# Patient Record
Sex: Male | Born: 1955 | Hispanic: No | Marital: Married | Smoking: Never smoker
Health system: Southern US, Community
[De-identification: ages and names within clinical notes are randomized; demographics above are authoritative.]

## PROBLEM LIST (undated history)

## (undated) DIAGNOSIS — K219 Gastro-esophageal reflux disease without esophagitis: Secondary | ICD-10-CM

## (undated) DIAGNOSIS — I2510 Atherosclerotic heart disease of native coronary artery without angina pectoris: Secondary | ICD-10-CM

## (undated) DIAGNOSIS — E785 Hyperlipidemia, unspecified: Secondary | ICD-10-CM

## (undated) DIAGNOSIS — M549 Dorsalgia, unspecified: Secondary | ICD-10-CM

## (undated) DIAGNOSIS — I219 Acute myocardial infarction, unspecified: Secondary | ICD-10-CM

## (undated) HISTORY — DX: Gastro-esophageal reflux disease without esophagitis: K21.9

## (undated) HISTORY — DX: Hyperlipidemia, unspecified: E78.5

## (undated) HISTORY — PX: TONSILLECTOMY: SUR1361

## (undated) HISTORY — DX: Atherosclerotic heart disease of native coronary artery without angina pectoris: I25.10

## (undated) HISTORY — DX: Dorsalgia, unspecified: M54.9

---
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
## 2000-04-01 ENCOUNTER — Ambulatory Visit: Admission: RE | Admit: 2000-04-01 | Discharge: 2000-04-01 | Payer: Self-pay | Admitting: Orthopedic Surgery

## 2000-04-01 ENCOUNTER — Encounter: Payer: Self-pay | Admitting: Orthopedic Surgery

## 2002-07-13 ENCOUNTER — Ambulatory Visit (HOSPITAL_COMMUNITY): Admission: RE | Admit: 2002-07-13 | Discharge: 2002-07-13 | Payer: Self-pay | Admitting: *Deleted

## 2007-07-21 ENCOUNTER — Ambulatory Visit: Payer: Self-pay | Admitting: Cardiology

## 2007-07-21 ENCOUNTER — Inpatient Hospital Stay (HOSPITAL_COMMUNITY): Admission: AD | Admit: 2007-07-21 | Discharge: 2007-07-23 | Payer: Self-pay | Admitting: Cardiology

## 2007-07-27 ENCOUNTER — Ambulatory Visit: Payer: Self-pay | Admitting: Cardiology

## 2007-07-27 ENCOUNTER — Ambulatory Visit: Payer: Self-pay

## 2007-08-10 ENCOUNTER — Ambulatory Visit: Payer: Self-pay | Admitting: Internal Medicine

## 2007-08-11 ENCOUNTER — Encounter (HOSPITAL_COMMUNITY): Admission: RE | Admit: 2007-08-11 | Discharge: 2007-11-16 | Payer: Self-pay | Admitting: Cardiology

## 2007-08-29 ENCOUNTER — Inpatient Hospital Stay (HOSPITAL_COMMUNITY): Admission: EM | Admit: 2007-08-29 | Discharge: 2007-08-30 | Payer: Self-pay | Admitting: Emergency Medicine

## 2007-08-29 ENCOUNTER — Ambulatory Visit: Payer: Self-pay | Admitting: Internal Medicine

## 2007-09-05 ENCOUNTER — Ambulatory Visit: Payer: Self-pay | Admitting: Cardiology

## 2007-09-14 ENCOUNTER — Ambulatory Visit: Payer: Self-pay | Admitting: Cardiology

## 2007-09-14 LAB — CONVERTED CEMR LAB
Calcium: 9.5 mg/dL (ref 8.4–10.5)
Creatinine, Ser: 1 mg/dL (ref 0.4–1.5)
GFR calc non Af Amer: 83 mL/min

## 2007-10-12 ENCOUNTER — Ambulatory Visit: Payer: Self-pay | Admitting: Cardiology

## 2007-10-14 ENCOUNTER — Ambulatory Visit: Payer: Self-pay

## 2007-10-18 ENCOUNTER — Ambulatory Visit: Payer: Self-pay | Admitting: Cardiovascular Disease

## 2007-10-18 ENCOUNTER — Ambulatory Visit: Payer: Self-pay

## 2007-11-15 ENCOUNTER — Ambulatory Visit: Payer: Self-pay | Admitting: Cardiovascular Disease

## 2007-11-17 ENCOUNTER — Ambulatory Visit: Payer: Self-pay | Admitting: Cardiology

## 2007-11-17 ENCOUNTER — Encounter (HOSPITAL_COMMUNITY): Admission: RE | Admit: 2007-11-17 | Discharge: 2007-12-22 | Payer: Self-pay | Admitting: Cardiology

## 2007-12-21 ENCOUNTER — Observation Stay (HOSPITAL_COMMUNITY): Admission: EM | Admit: 2007-12-21 | Discharge: 2007-12-23 | Payer: Self-pay | Admitting: Emergency Medicine

## 2007-12-21 ENCOUNTER — Ambulatory Visit: Payer: Self-pay | Admitting: Internal Medicine

## 2007-12-27 ENCOUNTER — Ambulatory Visit: Payer: Self-pay | Admitting: Cardiology

## 2007-12-27 DIAGNOSIS — E785 Hyperlipidemia, unspecified: Secondary | ICD-10-CM

## 2007-12-27 DIAGNOSIS — I2510 Atherosclerotic heart disease of native coronary artery without angina pectoris: Secondary | ICD-10-CM | POA: Insufficient documentation

## 2007-12-29 ENCOUNTER — Encounter (HOSPITAL_COMMUNITY): Admission: RE | Admit: 2007-12-29 | Discharge: 2008-01-11 | Payer: Self-pay | Admitting: Cardiology

## 2008-01-13 ENCOUNTER — Encounter (HOSPITAL_COMMUNITY): Admission: RE | Admit: 2008-01-13 | Discharge: 2008-04-10 | Payer: Self-pay | Admitting: Cardiology

## 2008-01-27 ENCOUNTER — Ambulatory Visit: Payer: Self-pay | Admitting: Cardiology

## 2008-01-30 ENCOUNTER — Ambulatory Visit: Payer: Self-pay | Admitting: Cardiology

## 2008-01-30 LAB — CONVERTED CEMR LAB
ALT: 32 units/L (ref 0–53)
Alkaline Phosphatase: 49 units/L (ref 39–117)
Bilirubin, Direct: 0.1 mg/dL (ref 0.0–0.3)
Cholesterol: 141 mg/dL (ref 0–200)
LDL Cholesterol: 85 mg/dL (ref 0–99)
Total Protein: 6.9 g/dL (ref 6.0–8.3)

## 2008-02-07 ENCOUNTER — Ambulatory Visit: Payer: Self-pay | Admitting: Cardiovascular Disease

## 2008-03-01 ENCOUNTER — Ambulatory Visit: Payer: Self-pay | Admitting: Cardiology

## 2008-03-01 LAB — CONVERTED CEMR LAB
Basophils Relative: 0.6 % (ref 0.0–3.0)
CO2: 30 meq/L (ref 19–32)
GFR calc non Af Amer: 94 mL/min
HCT: 44.2 % (ref 39.0–52.0)
Hemoglobin: 15.3 g/dL (ref 13.0–17.0)
INR: 1 (ref 0.8–1.0)
Monocytes Absolute: 0.7 10*3/uL (ref 0.1–1.0)
Monocytes Relative: 12.8 % — ABNORMAL HIGH (ref 3.0–12.0)
Neutro Abs: 3.2 10*3/uL (ref 1.4–7.7)
RBC: 4.88 M/uL (ref 4.22–5.81)
RDW: 12.4 % (ref 11.5–14.6)
Sodium: 140 meq/L (ref 135–145)
aPTT: 27.1 s (ref 21.7–29.8)

## 2008-03-09 ENCOUNTER — Inpatient Hospital Stay (HOSPITAL_BASED_OUTPATIENT_CLINIC_OR_DEPARTMENT_OTHER): Admission: RE | Admit: 2008-03-09 | Discharge: 2008-03-09 | Payer: Self-pay | Admitting: Cardiology

## 2008-03-09 ENCOUNTER — Ambulatory Visit: Payer: Self-pay | Admitting: Cardiology

## 2008-03-20 ENCOUNTER — Encounter: Payer: Self-pay | Admitting: Cardiology

## 2008-03-20 ENCOUNTER — Ambulatory Visit: Payer: Self-pay | Admitting: Cardiology

## 2008-03-27 ENCOUNTER — Ambulatory Visit: Payer: Self-pay | Admitting: Psychology

## 2008-04-11 ENCOUNTER — Ambulatory Visit: Payer: Self-pay | Admitting: Psychology

## 2008-04-18 ENCOUNTER — Encounter (HOSPITAL_COMMUNITY): Admission: RE | Admit: 2008-04-18 | Discharge: 2008-07-17 | Payer: Self-pay | Admitting: Cardiology

## 2008-04-23 ENCOUNTER — Telehealth: Payer: Self-pay | Admitting: Cardiology

## 2008-05-02 ENCOUNTER — Telehealth: Payer: Self-pay | Admitting: Cardiology

## 2008-05-17 ENCOUNTER — Ambulatory Visit: Payer: Self-pay | Admitting: Cardiology

## 2008-05-17 DIAGNOSIS — R5383 Other fatigue: Secondary | ICD-10-CM

## 2008-05-17 DIAGNOSIS — R5381 Other malaise: Secondary | ICD-10-CM

## 2008-05-18 LAB — CONVERTED CEMR LAB: TSH: 1.06 microintl units/mL (ref 0.35–5.50)

## 2008-05-21 ENCOUNTER — Encounter: Payer: Self-pay | Admitting: Pulmonary Disease

## 2008-05-21 ENCOUNTER — Ambulatory Visit (HOSPITAL_BASED_OUTPATIENT_CLINIC_OR_DEPARTMENT_OTHER): Admission: RE | Admit: 2008-05-21 | Discharge: 2008-05-21 | Payer: Self-pay | Admitting: Cardiology

## 2008-05-28 ENCOUNTER — Telehealth (INDEPENDENT_AMBULATORY_CARE_PROVIDER_SITE_OTHER): Payer: Self-pay | Admitting: *Deleted

## 2008-06-01 ENCOUNTER — Telehealth: Payer: Self-pay | Admitting: Cardiology

## 2008-06-04 ENCOUNTER — Ambulatory Visit: Payer: Self-pay | Admitting: Pulmonary Disease

## 2008-06-05 ENCOUNTER — Encounter: Payer: Self-pay | Admitting: Cardiology

## 2008-06-05 ENCOUNTER — Ambulatory Visit: Payer: Self-pay | Admitting: Cardiovascular Disease

## 2008-06-07 ENCOUNTER — Telehealth: Payer: Self-pay | Admitting: Cardiology

## 2008-06-20 ENCOUNTER — Telehealth (INDEPENDENT_AMBULATORY_CARE_PROVIDER_SITE_OTHER): Payer: Self-pay | Admitting: *Deleted

## 2008-06-21 ENCOUNTER — Telehealth (INDEPENDENT_AMBULATORY_CARE_PROVIDER_SITE_OTHER): Payer: Self-pay | Admitting: *Deleted

## 2008-06-29 ENCOUNTER — Ambulatory Visit: Payer: Self-pay | Admitting: Pulmonary Disease

## 2008-06-29 DIAGNOSIS — I219 Acute myocardial infarction, unspecified: Secondary | ICD-10-CM

## 2008-06-29 HISTORY — DX: Acute myocardial infarction, unspecified: I21.9

## 2008-07-08 ENCOUNTER — Encounter: Payer: Self-pay | Admitting: Pulmonary Disease

## 2008-07-09 ENCOUNTER — Telehealth: Payer: Self-pay | Admitting: Pulmonary Disease

## 2008-07-10 ENCOUNTER — Encounter: Payer: Self-pay | Admitting: Pulmonary Disease

## 2008-07-17 ENCOUNTER — Encounter: Payer: Self-pay | Admitting: Pulmonary Disease

## 2008-07-17 ENCOUNTER — Telehealth (INDEPENDENT_AMBULATORY_CARE_PROVIDER_SITE_OTHER): Payer: Self-pay | Admitting: *Deleted

## 2008-07-18 ENCOUNTER — Encounter (HOSPITAL_COMMUNITY): Admission: RE | Admit: 2008-07-18 | Discharge: 2008-10-12 | Payer: Self-pay | Admitting: Cardiology

## 2008-07-20 ENCOUNTER — Ambulatory Visit: Payer: Self-pay | Admitting: Pulmonary Disease

## 2008-07-20 DIAGNOSIS — G4730 Sleep apnea, unspecified: Secondary | ICD-10-CM | POA: Insufficient documentation

## 2008-07-23 ENCOUNTER — Telehealth (INDEPENDENT_AMBULATORY_CARE_PROVIDER_SITE_OTHER): Payer: Self-pay | Admitting: *Deleted

## 2008-07-25 ENCOUNTER — Telehealth: Payer: Self-pay | Admitting: Cardiology

## 2008-07-30 ENCOUNTER — Telehealth: Payer: Self-pay | Admitting: Cardiology

## 2008-07-30 ENCOUNTER — Telehealth: Payer: Self-pay | Admitting: Pulmonary Disease

## 2008-08-09 ENCOUNTER — Encounter: Payer: Self-pay | Admitting: Pulmonary Disease

## 2008-08-13 ENCOUNTER — Telehealth (INDEPENDENT_AMBULATORY_CARE_PROVIDER_SITE_OTHER): Payer: Self-pay | Admitting: *Deleted

## 2008-08-15 ENCOUNTER — Encounter: Payer: Self-pay | Admitting: Cardiology

## 2008-08-16 ENCOUNTER — Ambulatory Visit: Payer: Self-pay | Admitting: Pulmonary Disease

## 2008-08-16 DIAGNOSIS — G4733 Obstructive sleep apnea (adult) (pediatric): Secondary | ICD-10-CM | POA: Insufficient documentation

## 2008-08-17 ENCOUNTER — Ambulatory Visit: Payer: Self-pay | Admitting: Cardiology

## 2008-08-29 ENCOUNTER — Encounter: Payer: Self-pay | Admitting: Cardiology

## 2008-08-29 ENCOUNTER — Telehealth: Payer: Self-pay | Admitting: Cardiology

## 2008-09-04 ENCOUNTER — Telehealth: Payer: Self-pay | Admitting: Pulmonary Disease

## 2008-09-06 ENCOUNTER — Telehealth (INDEPENDENT_AMBULATORY_CARE_PROVIDER_SITE_OTHER): Payer: Self-pay | Admitting: *Deleted

## 2008-09-10 ENCOUNTER — Telehealth: Payer: Self-pay | Admitting: Cardiology

## 2008-09-11 ENCOUNTER — Ambulatory Visit: Payer: Self-pay | Admitting: Cardiology

## 2008-09-13 ENCOUNTER — Ambulatory Visit: Payer: Self-pay | Admitting: Pulmonary Disease

## 2008-09-13 ENCOUNTER — Telehealth: Payer: Self-pay | Admitting: Cardiology

## 2008-09-14 ENCOUNTER — Ambulatory Visit: Payer: Self-pay | Admitting: Cardiology

## 2008-09-14 LAB — CONVERTED CEMR LAB
Basophils Relative: 0.7 % (ref 0.0–3.0)
Bilirubin, Direct: 0.1 mg/dL (ref 0.0–0.3)
CO2: 30 meq/L (ref 19–32)
Calcium: 9.4 mg/dL (ref 8.4–10.5)
Creatinine, Ser: 1.1 mg/dL (ref 0.4–1.5)
Eosinophils Absolute: 0.1 10*3/uL (ref 0.0–0.7)
GFR calc non Af Amer: 74.29 mL/min (ref >60)
HDL: 46.9 mg/dL (ref >39.00)
LDL Cholesterol: 81 mg/dL (ref 0–99)
Lymphocytes Relative: 30.3 % (ref 12.0–46.0)
MCHC: 34.1 g/dL (ref 30.0–36.0)
Neutrophils Relative %: 54.3 % (ref 43.0–77.0)
RBC: 4.62 M/uL (ref 4.22–5.81)
Total Bilirubin: 1.1 mg/dL (ref 0.3–1.2)
Total CHOL/HDL Ratio: 3
Triglycerides: 86 mg/dL (ref 0.0–149.0)
VLDL: 17.2 mg/dL (ref 0.0–40.0)
WBC: 4.8 10*3/uL (ref 4.5–10.5)

## 2008-09-18 ENCOUNTER — Encounter: Payer: Self-pay | Admitting: Pulmonary Disease

## 2008-09-18 ENCOUNTER — Telehealth (INDEPENDENT_AMBULATORY_CARE_PROVIDER_SITE_OTHER): Payer: Self-pay | Admitting: *Deleted

## 2008-09-21 ENCOUNTER — Telehealth: Payer: Self-pay | Admitting: Pulmonary Disease

## 2008-09-28 ENCOUNTER — Encounter: Payer: Self-pay | Admitting: Pulmonary Disease

## 2008-09-28 ENCOUNTER — Ambulatory Visit: Payer: Self-pay | Admitting: Psychology

## 2008-10-02 ENCOUNTER — Ambulatory Visit: Payer: Self-pay | Admitting: Cardiovascular Disease

## 2008-10-02 ENCOUNTER — Telehealth: Payer: Self-pay | Admitting: Cardiology

## 2008-10-03 ENCOUNTER — Encounter: Payer: Self-pay | Admitting: Cardiology

## 2008-10-05 ENCOUNTER — Encounter: Payer: Self-pay | Admitting: Cardiology

## 2008-10-05 ENCOUNTER — Ambulatory Visit: Payer: Self-pay | Admitting: Cardiology

## 2008-10-05 ENCOUNTER — Observation Stay (HOSPITAL_COMMUNITY): Admission: EM | Admit: 2008-10-05 | Discharge: 2008-10-06 | Payer: Self-pay | Admitting: Emergency Medicine

## 2008-10-08 ENCOUNTER — Telehealth: Payer: Self-pay | Admitting: Pulmonary Disease

## 2008-10-13 ENCOUNTER — Encounter (HOSPITAL_COMMUNITY): Admission: RE | Admit: 2008-10-13 | Discharge: 2008-12-11 | Payer: Self-pay | Admitting: Cardiology

## 2008-10-24 ENCOUNTER — Telehealth: Payer: Self-pay | Admitting: Cardiology

## 2008-10-24 ENCOUNTER — Ambulatory Visit: Payer: Self-pay | Admitting: Cardiology

## 2008-10-28 IMAGING — CR DG CHEST 1V PORT
1 series · 1 of 1 positions shown · non-contrast
Comparison: 07/21/2007

CLINICAL DATA: Chest pain.  Lightheadedness.

PORTABLE CHEST - 1 VIEW

[AP]
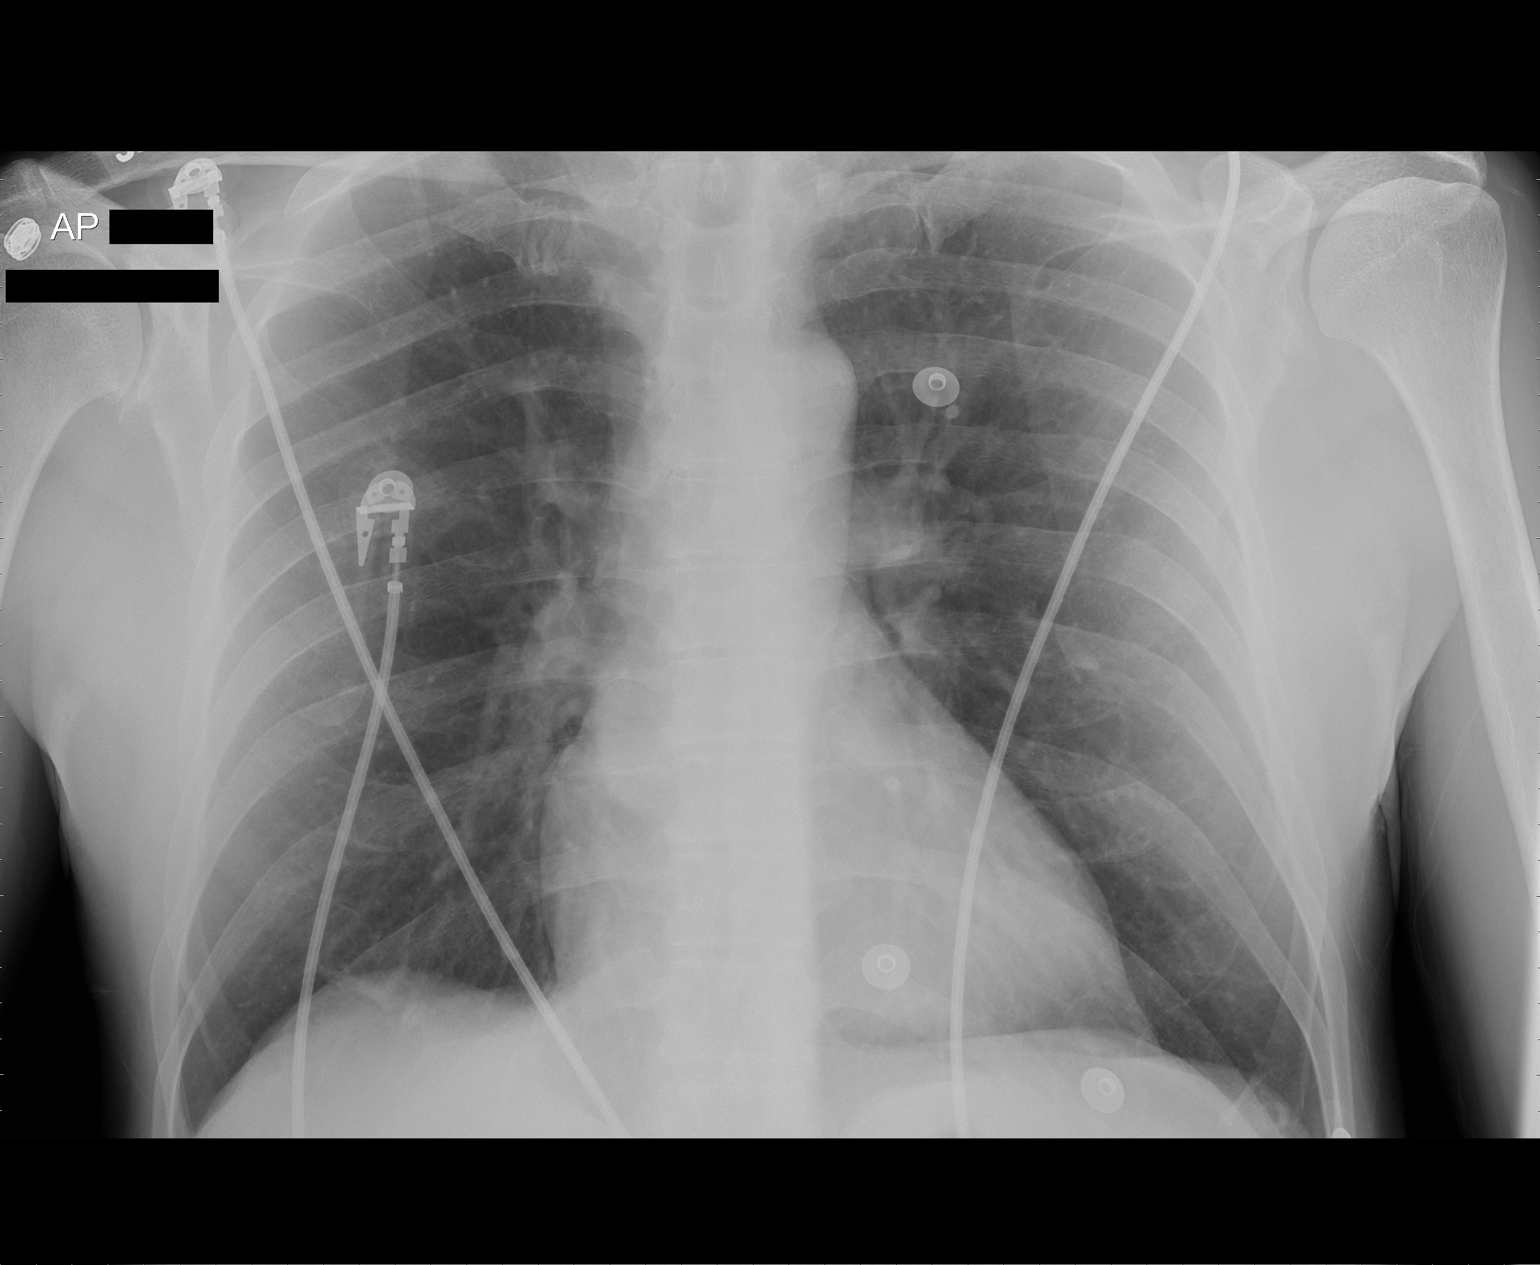

[1 of 1 positions shown; findings below may reference images not displayed]

FINDINGS: Normal cardiomediastinal silhouette.  Lungs clear of an
active process.  No vascular congestion.
IMPRESSION: No acute chest disease.

## 2008-10-30 ENCOUNTER — Encounter: Payer: Self-pay | Admitting: Cardiology

## 2008-11-05 ENCOUNTER — Ambulatory Visit: Payer: Self-pay | Admitting: Pulmonary Disease

## 2008-11-22 ENCOUNTER — Encounter (INDEPENDENT_AMBULATORY_CARE_PROVIDER_SITE_OTHER): Payer: Self-pay | Admitting: *Deleted

## 2008-11-23 ENCOUNTER — Encounter (INDEPENDENT_AMBULATORY_CARE_PROVIDER_SITE_OTHER): Payer: Self-pay | Admitting: *Deleted

## 2008-12-18 ENCOUNTER — Telehealth: Payer: Self-pay | Admitting: Cardiology

## 2008-12-25 ENCOUNTER — Telehealth: Payer: Self-pay | Admitting: Cardiology

## 2009-04-02 ENCOUNTER — Ambulatory Visit: Payer: Self-pay | Admitting: Cardiovascular Disease

## 2009-04-02 ENCOUNTER — Encounter: Payer: Self-pay | Admitting: Cardiology

## 2009-04-12 ENCOUNTER — Ambulatory Visit: Payer: Self-pay | Admitting: Cardiology

## 2009-04-12 ENCOUNTER — Ambulatory Visit: Payer: Self-pay | Admitting: Pulmonary Disease

## 2009-04-15 ENCOUNTER — Ambulatory Visit: Payer: Self-pay | Admitting: Cardiology

## 2009-04-19 ENCOUNTER — Telehealth: Payer: Self-pay | Admitting: Cardiology

## 2009-04-19 LAB — CONVERTED CEMR LAB
Alkaline Phosphatase: 60 units/L (ref 39–117)
Basophils Absolute: 0 10*3/uL (ref 0.0–0.1)
Bilirubin, Direct: 0.1 mg/dL (ref 0.0–0.3)
CO2: 32 meq/L (ref 19–32)
Calcium: 9.5 mg/dL (ref 8.4–10.5)
Creatinine, Ser: 1 mg/dL (ref 0.4–1.5)
Eosinophils Absolute: 0.1 10*3/uL (ref 0.0–0.7)
Glucose, Bld: 98 mg/dL (ref 70–99)
HDL: 48.6 mg/dL (ref >39.00)
Lymphocytes Relative: 15.4 % (ref 12.0–46.0)
MCHC: 33.6 g/dL (ref 30.0–36.0)
Neutrophils Relative %: 73.4 % (ref 43.0–77.0)
Platelets: 205 10*3/uL (ref 150.0–400.0)
RBC: 4.75 M/uL (ref 4.22–5.81)
RDW: 13 % (ref 11.5–14.6)
Total CHOL/HDL Ratio: 3
Triglycerides: 64 mg/dL (ref 0.0–149.0)

## 2009-04-29 ENCOUNTER — Telehealth: Payer: Self-pay | Admitting: Cardiology

## 2009-06-20 ENCOUNTER — Telehealth: Payer: Self-pay | Admitting: Cardiology

## 2009-07-26 ENCOUNTER — Encounter: Payer: Self-pay | Admitting: Cardiology

## 2009-08-05 ENCOUNTER — Telehealth: Payer: Self-pay | Admitting: Cardiology

## 2009-08-08 ENCOUNTER — Telehealth: Payer: Self-pay | Admitting: Cardiology

## 2009-08-22 ENCOUNTER — Encounter: Payer: Self-pay | Admitting: Cardiovascular Disease

## 2009-08-22 ENCOUNTER — Ambulatory Visit: Payer: Self-pay | Admitting: Cardiovascular Disease

## 2009-08-22 ENCOUNTER — Ambulatory Visit: Payer: Self-pay

## 2009-08-22 ENCOUNTER — Ambulatory Visit: Payer: Self-pay | Admitting: Cardiology

## 2009-08-23 ENCOUNTER — Telehealth: Payer: Self-pay | Admitting: Cardiology

## 2009-09-26 ENCOUNTER — Encounter (INDEPENDENT_AMBULATORY_CARE_PROVIDER_SITE_OTHER): Payer: Self-pay | Admitting: *Deleted

## 2009-11-15 ENCOUNTER — Ambulatory Visit: Payer: Self-pay | Admitting: Cardiology

## 2009-11-19 ENCOUNTER — Telehealth: Payer: Self-pay | Admitting: Cardiology

## 2009-11-20 ENCOUNTER — Ambulatory Visit: Payer: Self-pay | Admitting: Cardiology

## 2009-11-20 DIAGNOSIS — E782 Mixed hyperlipidemia: Secondary | ICD-10-CM | POA: Insufficient documentation

## 2009-11-22 ENCOUNTER — Telehealth: Payer: Self-pay | Admitting: Cardiology

## 2009-11-27 LAB — CONVERTED CEMR LAB
AST: 20 units/L (ref 0–37)
Albumin: 4.3 g/dL (ref 3.5–5.2)
Cholesterol: 210 mg/dL — ABNORMAL HIGH (ref 0–200)
Direct LDL: 146.2 mg/dL
Total CHOL/HDL Ratio: 5
Total Protein: 6.7 g/dL (ref 6.0–8.3)
Triglycerides: 119 mg/dL (ref 0.0–149.0)
VLDL: 23.8 mg/dL (ref 0.0–40.0)

## 2009-12-12 ENCOUNTER — Telehealth: Payer: Self-pay | Admitting: Cardiology

## 2009-12-30 ENCOUNTER — Ambulatory Visit: Payer: Self-pay | Admitting: Cardiology

## 2010-01-01 ENCOUNTER — Telehealth: Payer: Self-pay | Admitting: Cardiology

## 2010-01-08 ENCOUNTER — Telehealth: Payer: Self-pay | Admitting: Cardiology

## 2010-01-08 LAB — CONVERTED CEMR LAB
ALT: 25 units/L (ref 0–53)
AST: 29 units/L (ref 0–37)
Alkaline Phosphatase: 59 units/L (ref 39–117)
HDL: 48.7 mg/dL (ref >39.00)
Total Bilirubin: 0.7 mg/dL (ref 0.3–1.2)
Triglycerides: 88 mg/dL (ref 0.0–149.0)

## 2010-02-13 NOTE — Progress Notes (Signed)
Summary: crestor refill   Phone Note Call from Patient Call back at [phone]   Caller: Patient Reason for Call: Refill Medication, Talk to Nurse Summary of Call: pt needs crestor. pt states he had samples. pt needs refill crestor 40mg  1/2 tablet once a day at night. pharmacy # [facility] [phone] Initial call taken by: Roe Coombs,  January 08, 2010 9:00 AM  Follow-up for Phone Call        According to the pt's chart, we put him on Crestor 10mg  once daily. I have left a messge for the pt to call. [name], RN, BSN  January 08, 2010 4:53 PM  I spoke with the pt. I did clarify with him that he is on Crestor 20mg  1/2 tablet once daily. I will send this refill in to the pharmacy. Follow-up by: [name], RN, BSN,  January 08, 2010 4:58 PM    New/Updated Medications: CRESTOR 20 MG TABS (ROSUVASTATIN CALCIUM) Take 1/2 tablet by mouth daily. Prescriptions: CRESTOR 20 MG TABS (ROSUVASTATIN CALCIUM) Take 1/2 tablet by mouth daily.  #30 x 3   Entered by:   [name], RN, BSN   Authorized by:   [name], MD, [facility]   Signed by:   [name], RN, BSN on 01/08/2010   Method used:   Electronically to        [id number] (retail)       [address]       [place], [place]  [id number]       Ph: [phone]       Fax: [phone]   RxID:   [id number]

## 2010-02-13 NOTE — Progress Notes (Signed)
Summary: lightheaded  Phone Note Call from Patient Call back at Home Phone [phone]   Caller: Patient Summary of Call: Pt calling regarding his new prescription  is is having lightheaded Initial call taken by: Judie Grieve,  August 23, 2009 11:21 AM  Follow-up for Phone Call        PT CALLED C/O AWAKING DURING NIGHT WITH H/A AND RUSH OF BLOOD TO HEAD . JUST STARTED PRAVASTATIN INFORMED PT NOT SURE IS RELATED FROM MED BUT WILL FORWARD TO DR Juanda Chance FOR REVIEW. INSTRUCTED TO CONT TO TAKE AND IF S/S   WORSEN TO HOLD MED IF NO CHANGE THEN MAY RESUME WILL CALL WITH UPDATE. Follow-up by: Scherrie Bateman, LPN,  August 23, 2009 11:48 AM

## 2010-02-13 NOTE — Progress Notes (Signed)
Summary: lab results  Phone Note Call from Patient   Caller: Patient [phone] Reason for Call: Talk to Nurse, Lab or Test Results Summary of Call: pt calling re lab results Initial call taken by: Glynda Jaeger,  January 01, 2010 3:21 PM  Follow-up for Phone Call        pt given lab results, he is aware that Dr Juanda Chance has not reviewed labs but LDL is much better, copy of labs mailed to him per his request Meredith Staggers, RN  January 01, 2010 4:04 PM

## 2010-02-13 NOTE — Progress Notes (Signed)
Summary: Test results   Phone Note Call from Patient Call back at [phone]   Caller: Patient Initial call taken by: Judie Grieve,  April 19, 2009 11:07 AM  Follow-up for Phone Call        I called and spoke with the pt about his results from his most recent labs. His med list states that he is on Crestor 40mg  tabs. He states he has been getting samples from his PCP and he thinks that they are 20mg  tabs of Crestor. He will call me back to clarify. I explained I needed to note this on his med list so it is accurate. The pt also requested that since his lipid panel looks so good, would [name] consider decreasing his Crestor to 1/2 the dose he is taking now. I explained I would address this with [name], but I am not sure if he will want to change his dose since his numbers do look so good. I will call him back after speaking with [name]. The pt is agreeable. Follow-up by: [name], RN, BSN,  April 19, 2009 12:12 PM  Additional Follow-up for Phone Call Additional follow up Details #1::        pt called back and stated he was on Crestor 20mg  daily, will send to [facility]. for Dr Juanda Chance to review [name] Staggers, RN  April 22, 2009 9:24 AM   I would encourage him to stay on 20 if he is having no side effects.  If money is an issue we might prescribe 40 and have him take half tablet daily. BB Additional Follow-up by: [name], MD, [facility],  April 23, 2009 10:59 PM    Additional Follow-up for Phone Call Additional follow up Details #2::    I called the pt and made him aware that [name] recommends he stays on Crestor 20mg  once daily. The pt is agreeable. Follow-up by: [name], RN, BSN,  April 24, 2009 4:30 PM  New/Updated Medications: PLAVIX 75 MG TABS (CLOPIDOGREL BISULFATE) Take 1 tab by mouth daily CRESTOR 20 MG TABS (ROSUVASTATIN CALCIUM) Take one tablet by mouth daily. Prescriptions: PLAVIX 75 MG TABS (CLOPIDOGREL BISULFATE) Take 1 tab by mouth daily   #8 x 0   Entered by:   [name], RN, BSN   Authorized by:   [name], MD, [facility]   Signed by:   [name], MD, [facility] on 04/19/2009   Method used:   Samples Given   RxID:   [id number]

## 2010-02-13 NOTE — Letter (Signed)
Summary: Generic Letter  Architectural technologist, Main Office  [address]   [place], [place] [address]   Phone: [phone]  Fax: [phone]        August 22, 2009 MRN: [id number]    [facility] [address] [place], [place]  [address]    Dear Mr. Kallio,         Sincerely,  [name], MD, [facility]  This letter has been electronically signed by your physician.

## 2010-02-13 NOTE — Progress Notes (Signed)
Summary: F/U to 7/25 phone note  ---- Converted from flag ---- ---- 08/06/2009 10:28 PM, [name], MD, [facility] wrote: This sounds a bit complex to handle over the phone.  Let's get the letter from Dr Maple Hudson and have him come in for an OV. BB ------------------------------  Phone Note Outgoing Call   Call placed by: [name], RN, BSN,  August 08, 2009 9:44 AM Call placed to: Patient Summary of Call: I called and spoke with the pt. He is scheduled to see [name] on 8/11 @ 2:15pm. I have explained to the pt that I have still not seen the letter from Dr. Roxy Cedar office. I will continue to look for this, and the pt states he will call them next week to f/u. Initial call taken by: [name], RN, BSN,  August 08, 2009 9:46 AM

## 2010-02-13 NOTE — Assessment & Plan Note (Signed)
Summary: f30m   Visit Type:  Follow-up Referring Provider:  [name] Primary Provider:  lucy barden p.a. (Dr. Derrell Lolling)  CC:  L side chest pain with arm pain.  History of Present Illness: The patient is 55 years old and return for management of CAD. He had an inferior MI in 2009 treated with a drug-eluting stent to the right coronary artery. Later in 2009 he had a non-ST elevation MI treated with drug-eluting stents to the circumflex and LAD by Dr Clifton James. He later had a followup catheter which showed nonobstructive disease.  I recently saw him for symptoms of fatigue. We change his Crestor  Stat. We change his metoprolol to Toprol access and a long-acting. He says he is doing somewhat better. He is many days where he feels good and he may have a day or 2 where he feels very fatigued. He's had minimal chest pain.  His other problems include hyperlipidemia and obstructive sleep apnea.  Current Medications (verified): 1)  Fish Oil 1000 Mg Caps (Omega-3 Fatty Acids) .Marland Kitchen.. 1 Tab Once Daily 2)  Vitamin D3 2000 Unit Caps (Cholecalciferol) .... Take 1 Tablet By Mouth Once A Day 3)  Plavix 75 Mg Tabs (Clopidogrel Bisulfate) .... Take 1 Tab By Mouth Daily 4)  Pravastatin Sodium 10 Mg Tabs (Pravastatin Sodium) .... Take One Tablet By Mouth Daily At Bedtime 5)  Metoprolol Succinate 25 Mg Xr24h-Tab (Metoprolol Succinate) .... Take 1/2  Tablet By Mouth Daily 6)  Aspirin 81 Mg Tbec (Aspirin) .... Take One Tablet By Mouth Daily 7)  Centrum Silver  Tabs (Multiple Vitamins-Minerals) .... Take One Tablet By Mouth Once Daily Occasionally 8)  Nitrostat 0.4 Mg Subl (Nitroglycerin) .Marland Kitchen.. 1 Tablet Under Tongue At Onset of Chest Pain; You May Repeat Every 5 Minutes For Up To 3 Doses. 9)  Meloxicam 7.5 Mg Tabs (Meloxicam) .... One Tab By Mouth Once Daily As Needed 10)  Probiotic  Caps (Probiotic Product) .... Take 1 Capsule By Mouth Once A Day Occasionally 11)  Famotidine 40 Mg Tabs (Famotidine) .Marland Kitchen.. 1 Tab Qd 12)   Hydrocodone-Homatropine 5-1.5 Mg Tabs (Hydrocodone-Homatropine) .... As Needed  Allergies (verified): 1)  ! Pcn 2)  ! * Stepomycin 3)  Amoxicillin (Amoxicillin)  Past History:  Past Medical History: Reviewed history from 08/16/2008 and no changes required. IMPRESSION: 1. Coronary artery disease, status post diaphragmatic wall infarction     treated with a drug-eluting stent to the right coronary 7/09 wih subsequent DES to LAD and CFX and    non-obstrucive disease at last cath. 2. Good left ventricular function. 3. Symptoms of fatigue and decreased exercise tolerance. 4. Hyperlipidemia. 5. Gastroesophageal reflux disease.  6. Sleep Apnea  Review of Systems       ROS is negative except as outlined in HPI.   Vital Signs:  Patient profile:   55 year old male Height:      72 inches Weight:      156 pounds BMI:     21.23 Pulse rate:   75 / minute BP sitting:   118 / 70  (left arm) Cuff size:   regular  Vitals Entered By: Hardin Negus, RMA (November 15, 2009 3:57 PM)  Physical Exam  Additional Exam:  Gen. Well-nourished, in no distress   Neck: No JVD, thyroid not enlarged, no carotid bruits Lungs: No tachypnea, clear without rales, rhonchi or wheezes Cardiovascular: Rhythm regular, PMI not displaced,  heart sounds  normal, no murmurs or gallops, no peripheral edema, pulses normal in all  4 extremities. Abdomen: BS normal, abdomen soft and non-tender without masses or organomegaly, no hepatosplenomegaly. MS: No deformities, no cyanosis or clubbing   Neuro:  No focal sns   Skin:  no lesions    Impression & Recommendations:  Problem # 1:  CAD, NATIVE VESSEL (ICD-414.01) He has had multiple PCI procedures as described in the history of present illness.  He had  not had any chest pain and this problem appears stable. His updated medication list for this problem includes:    Plavix 75 Mg Tabs (Clopidogrel bisulfate) .Marland Kitchen... Take 1 tab by mouth daily    Metoprolol Succinate 25  Mg Xr24h-tab (Metoprolol succinate) .Marland Kitchen... Take 1/2  tablet by mouth daily    Aspirin 81 Mg Tbec (Aspirin) .Marland Kitchen... Take one tablet by mouth daily    Nitrostat 0.4 Mg Subl (Nitroglycerin) .Marland Kitchen... 1 tablet under tongue at onset of chest pain; you may repeat every 5 minutes for up to 3 doses.  Orders: EKG w/ Interpretation (93000)  Problem # 2:  FATIGUE (ICD-780.79) He still has symptoms of intermittent fatigue. Etiology is unclear. It seems better since we adjusted his medications.  Problem # 3:  HYPERLIPIDEMIA-MIXED (ICD-272.4) He was switched on Crestor to pravastatin because of fatigue a few months ago. We will get a fasting lipid and liver profile. His updated medication list for this problem includes:    Pravastatin Sodium 10 Mg Tabs (Pravastatin sodium) .Marland Kitchen... Take one tablet by mouth daily at bedtime  Patient Instructions: 1)  Your physician recommends that you continue on your current medications as directed. Please refer to the Current Medication list given to you today. 2)  Your physician wants you to follow-up in: 6 months with Dr. Clifton James.  You will receive a reminder letter in the mail two months in advance. If you don't receive a letter, please call our office to schedule the follow-up appointment.

## 2010-02-13 NOTE — Progress Notes (Signed)
Summary: calling regarding blood test and Crestor  Phone Note Call from Patient Call back at [phone]   Caller: Patient Summary of Call: Pt calling regarding Crestor and getting a new blood test Initial call taken by: Judie Grieve,  December 12, 2009 10:06 AM  Follow-up for Phone Call        I spoke with the pt. He is tolerating the Crestor 10mg  ok. He will take another 3 weeks worth and then come for a repeat lipid/liver panel around 12/30/09. Follow-up by: [name], RN, BSN,  December 12, 2009 12:35 PM

## 2010-02-13 NOTE — Progress Notes (Signed)
Summary: pt found blood clot in his nose (done 06/27/09  js)  Phone Note Call from Patient Call back at [phone]   Caller: Patient Reason for Call: Talk to Nurse, Talk to Doctor Summary of Call: pt noticed a blood clot in his right nasel passage and his mucus is red just wanted to let you know and see what he should do if anything Initial call taken by: Omer Jack,  June 20, 2009 3:16 PM  Follow-up for Phone Call        [facility]. Follow-up by: [name], RN, BSN,  June 20, 2009 6:28 PM  Additional Follow-up for Phone Call Additional follow up Details #1::        Pt said this was an isolated incident and it was dried blood that he found.  I told him since it was just this one time that we wouldn't have him do anything differently but if he notices it happening more frequently, he should call us back.  He's on Plavix & Aspirin 81mg . Pt verbalized understanding of instructions. Additional Follow-up by: Minerva Areola, RN, BSN,  June 27, 2009 11:11 AM

## 2010-02-13 NOTE — Assessment & Plan Note (Signed)
Summary: f56m   Referring Provider:  [name] Primary Provider:  lucy barden p.a. (Dr. Derrell Lolling)  CC:  [name]/pt states every now and then he gets a little discomfort in his chest. He notices warm air makes him very tired and exertion makes him feel like he cannot keep up.Marland Kitchen  History of Present Illness: The patient is 55 years old and return for management of CAD. In July of 2009 he had an inferior MI treated with a drug-eluting stent to the right coronary. In December 2009 he had a non-ST elevation MI treated with stents to the circumflex artery and left anterior descending artery. He was bothered by chest pain off and on for sometime after that and was involved in a rehabilitation protocol. He is now doing better and has had only occasional sharp chest pains. He is back at work at [employer].  He ALSO HAS OBSTRUCTIVE SLEEP APNEA WHICH IS MANAGED BY DR. Vassie Loll.  His other problems include hyperlipidemia and GERD.  Current Medications (verified): 1)  Trap 2 Study Drugs .... Take As Directed 2)  Fish Oil 1000 Mg Caps (Omega-3 Fatty Acids) .Marland Kitchen.. 1 Tab Once Daily 3)  Vitamin D3 2000 Unit Caps (Cholecalciferol) .... Take 1 Tablet By Mouth Once A Day 4)  Famotidine 40 Mg Tabs (Famotidine) .Marland Kitchen.. 1 Tab Qd 5)  Plavix 75 Mg Tabs (Clopidogrel Bisulfate) .... Take 1 Tab By Mouth Daily 6)  Crestor 40 Mg Tabs (Rosuvastatin Calcium) .Marland Kitchen.. 1 Tablet By Mouth Daily 7)  Metoprolol Tartrate 25 Mg Tabs (Metoprolol Tartrate) .... Take One-Half Tablet By Mouth Twice A Day 8)  Aspirin 81 Mg Tbec (Aspirin) .... Take One Tablet By Mouth Daily 9)  Centrum Silver  Tabs (Multiple Vitamins-Minerals) .... Take One Tablet By Mouth Once Daily. 10)  Nitrostat 0.4 Mg Subl (Nitroglycerin) .Marland Kitchen.. 1 Tablet Under Tongue At Onset of Chest Pain; You May Repeat Every 5 Minutes For Up To 3 Doses. 11)  Meloxicam 7.5 Mg Tabs (Meloxicam) .... One Tab By Mouth Once Daily As Needed 12)  Probiotic  Caps (Probiotic Product) .... Take 1 Capsule By Mouth Once  A Day  Allergies (verified): 1)  ! Pcn 2)  ! * Stepomycin 3)  Amoxicillin (Amoxicillin)  Past History:  Past Medical History: Reviewed history from 08/16/2008 and no changes required. IMPRESSION: 1. Coronary artery disease, status post diaphragmatic wall infarction     treated with a drug-eluting stent to the right coronary 7/09 wih subsequent DES to LAD and CFX and    non-obstrucive disease at last cath. 2. Good left ventricular function. 3. Symptoms of fatigue and decreased exercise tolerance. 4. Hyperlipidemia. 5. Gastroesophageal reflux disease.  6. Sleep Apnea  Review of Systems       ROS is negative except as outlined in HPI.   Vital Signs:  Patient profile:   55 year old male Height:      72 inches Weight:      157 pounds BMI:     21.37 Pulse rate:   88 / minute Pulse rhythm:   irregular BP sitting:   138 / 78  (left arm) Cuff size:   regular  Vitals Entered By: Judithe Modest CMA (April 12, 2009 4:07 PM)  Physical Exam  Additional Exam:  Gen. Well-nourished, in no distress   Neck: No JVD, thyroid not enlarged, no carotid bruits Lungs: No tachypnea, clear without rales, rhonchi or wheezes Cardiovascular: Rhythm regular, PMI not displaced,  heart sounds  normal, no murmurs or gallops, no peripheral  edema, pulses normal in all 4 extremities. Abdomen: BS normal, abdomen soft and non-tender without masses or organomegaly, no hepatosplenomegaly. MS: No deformities, no cyanosis or clubbing   Neuro:  No focal sns   Skin:  no lesions    Impression & Recommendations:  Problem # 1:  CAD, NATIVE VESSEL (ICD-414.01) He has had 2 previous MIs and has gurgling stents in all 3 vessels. He's had no recent angina and this appears stable.  He does say he is quite sensitive to heat and when he gets higher his energy level goes down. His updated medication list for this problem includes:    Plavix 75 Mg Tabs (Clopidogrel bisulfate) .Marland Kitchen... Take 1 tab by mouth daily     Metoprolol Tartrate 25 Mg Tabs (Metoprolol tartrate) .Marland Kitchen... Take one-half tablet by mouth twice a day    Aspirin 81 Mg Tbec (Aspirin) .Marland Kitchen... Take one tablet by mouth daily    Nitrostat 0.4 Mg Subl (Nitroglycerin) .Marland Kitchen... 1 tablet under tongue at onset of chest pain; you may repeat every 5 minutes for up to 3 doses.  Orders: EKG w/ Interpretation (93000)  Problem # 2:  HYPERLIPIDEMIA-MIXED (ICD-272.4) This is being managed with Crestor. We will plan to get a fasting lipid and liver profile. His updated medication list for this problem includes:    Crestor 40 Mg Tabs (Rosuvastatin calcium) .Marland Kitchen... 1 tablet by mouth daily  Problem # 3:  OBSTRUCTIVE SLEEP APNEA (ICD-780.57) He has obstructive sleep apnea and uses the CPAP machine regularly. He still has symptoms of fatigue.  Patient Instructions: 1)  Your physician recommends that you return for FASTING lab work : bmet/cbc/lipid/liver/tsh (414.01;272.2) 2)  Your physician wants you to follow-up in: 6 months.   You will receive a reminder letter in the mail two months in advance. If you don't receive a letter, please call our office to schedule the follow-up appointment.

## 2010-02-13 NOTE — Progress Notes (Signed)
Summary: Lipid panel (LMTC)/2ND CALL  ---- Converted from flag ---- ---- 11/21/2009 9:14 AM, [name], MD, [facility] wrote: LDL way up.  See if he would be willing to try Crestor 10 (couldn't tolerate 20). If so recheck L/L 6 wks. BB ------------------------------  Phone Note Outgoing Call   Call placed by: [name], RN, BSN,  November 22, 2009 3:47 PM Call placed to: Patient Summary of Call: [facility]. [name], RN, BSN  November 22, 2009 3:48 PM   Follow-up for Phone Call        Pt returning call Judie Grieve  November 26, 2009 8:06 AM  Pt. is willing to switch to 10mg  of Crestor but would like to try samples first before we call in a prescription. He will call us back in a few weeks & if he likes Crestor, we can set him up to recheck his lipids/liver function test.  Ellender Hose RN  November 26, 2009 11:04 AM  Follow-up by: Whitney Maeola Sarah RN,  November 26, 2009 11:04 AM  Additional Follow-up for Phone Call Additional follow up Details #1::        PT IS RETURNING CALL. PT WANTS TO KNOW BLOOD WORK RESULTS.  PT # [phone] Additional Follow-up by: Roe Coombs,  November 26, 2009 10:24 AM
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
## 2010-02-13 NOTE — Assessment & Plan Note (Signed)
Summary: follow up/klw   Visit Type:  Follow-up Copy to:  [name] Primary Provider/Referring Provider:  lucy barden p.a. (Dr. Derrell Lolling)  CC:  Pt here for follow up. Pt states is using CPAP every night. Pt states he can tell the difference w/o it. Pt states he is doing better.  History of Present Illness: 53/M, [place] origin, for FU of obstructive sleep apnea. He presented with excessive tiredness since his myocardial infarction in 7/09. He required stenting to RCA & 2 months later to LAD & circumflex too. Echo showed nml LV function. He has  been maintained on lopressor & crestor since. TSH nml.  He only drinks decaffeinated beverages.PSG showed mild OSA with AHI 5/h , RDI 56/h & mild - moderate snoring. O2 desaturation was as low as 78%. No cardiac arrythmias were noted. He was desensitized with a medium quattro full face mask. TST was 247 mins & REM supine sleep was 25 mins. APneas were noted during REM sleep only.  OV post CPAP >>Nasal pillows caused headache, Full face mask is better but causes frequent arousals.  Download >> on pillows, residual events ++. With EPR turned off & FF mask >>AHI 14.6, no leak. Avg pressure 9 cm reviewed download 7/7 - 7/29 >> 4 days more than 4 hrs > better. Fitted with oral applaince in 8/10   November 05, 2008 Reviewed rpt split PSG >> on OA, RDI decreased to 25/h,recomm CPAP 3-6 cm . Did not try nuvigil. He is alternating OA with CPAP. Back to work. Remains fatigued on some mornings. Overall tolerating better.Concern about travel & use of devices.  April 12, 2009 11:56 AM  Stopped using OA, now on CPAP alone & has been able to increase duration of use. Feels refreshed in am except for the occasional night. has been nack to work fulltime. Doing well from cardiac standpt. mask ok, pressure ok, no dryness. Ambient temp changes seem to affect him. overall fatigue & feeling of wellbeing is better.  Current Medications (verified): 1)  Trap 2 Study Drugs  .... Take As Directed 2)  Fish Oil 1000 Mg Caps (Omega-3 Fatty Acids) .Marland Kitchen.. 1 Tab Once Daily 3)  Vitamin D3 2000 Unit Caps (Cholecalciferol) .... Take 1 Tablet By Mouth Once A Day 4)  Famotidine 40 Mg Tabs (Famotidine) .Marland Kitchen.. 1 Tab Qd 5)  Plavix 75 Mg Tabs (Clopidogrel Bisulfate) .... Take 1 Tab By Mouth Daily 6)  Crestor 40 Mg Tabs (Rosuvastatin Calcium) .Marland Kitchen.. 1 Tablet By Mouth Daily 7)  Metoprolol Tartrate 25 Mg Tabs (Metoprolol Tartrate) .... Take One-Half Tablet By Mouth Twice A Day 8)  Aspirin 81 Mg Tbec (Aspirin) .... Take One Tablet By Mouth Daily 9)  Centrum Silver  Tabs (Multiple Vitamins-Minerals) .... Take One Tablet By Mouth Once Daily. 10)  Nitrostat 0.4 Mg Subl (Nitroglycerin) .Marland Kitchen.. 1 Tablet Under Tongue At Onset of Chest Pain; You May Repeat Every 5 Minutes For Up To 3 Doses. 11)  Meloxicam 7.5 Mg Tabs (Meloxicam) .... One Tab By Mouth Once Daily As Needed 12)  Probiotic  Caps (Probiotic Product) .... Take 1 Capsule By Mouth Once A Day  Allergies (verified): 1)  ! Pcn 2)  ! * Stepomycin 3)  Amoxicillin (Amoxicillin)  Past History:  Past Medical History: Last updated: 08/16/2008 IMPRESSION: 1. Coronary artery disease, status post diaphragmatic wall infarction     treated with a drug-eluting stent to the right coronary 7/09 wih subsequent DES to LAD and CFX and    non-obstrucive disease at  last cath. 2. Good left ventricular function. 3. Symptoms of fatigue and decreased exercise tolerance. 4. Hyperlipidemia. 5. Gastroesophageal reflux disease.  6. Sleep Apnea  Social History: Last updated: 06/29/2008 He works at [employer] in [place]. He is originally from [place]. He is married. He does not smoke. Patient never smoked.   Review of Systems  The patient denies anorexia, fever, weight loss, weight gain, vision loss, decreased hearing, hoarseness, chest pain, syncope, dyspnea on exertion, peripheral edema, prolonged cough, headaches, hemoptysis, abdominal pain, melena,  hematochezia, severe indigestion/heartburn, hematuria, muscle weakness, suspicious skin lesions, transient blindness, difficulty walking, depression, unusual weight change, and abnormal bleeding.    Vital Signs:  Patient profile:   55 year old male Height:      72 inches Weight:      157.38 pounds O2 Sat:      100 % on Room air Temp:     98.5 degrees F oral Pulse rate:   81 / minute BP sitting:   126 / 78  (left arm) Cuff size:   regular  Vitals Entered By: Zackery Barefoot CMA (April 12, 2009 11:37 AM)  O2 Flow:  Room air CC: Pt here for follow up. Pt states is using CPAP every night. Pt states he can tell the difference w/o it. Pt states he is doing better Comments Medications reviewed with patient Verified contact number and pharmacy with patient Zackery Barefoot CMA  April 12, 2009 11:38 AM    Physical Exam  Additional Exam:  Gen. Pleasant, well-nourished, in no distress, anxious ENT - no lesions, no post nasal drip, class 2 airway Neck: No JVD, no thyromegaly, no carotid bruits Lungs: no use of accessory muscles, no dullness to percussion, clear without rales or rhonchi  Cardiovascular: Rhythm regular, heart sounds  normal, no murmurs or gallops, no peripheral edema Musculoskeletal: No deformities, no cyanosis or clubbing      Impression & Recommendations:  Problem # 1:  OBSTRUCTIVE SLEEP APNEA (ICD-327.23)  Compliance encouraged, wt loss emphasized, asked to avoid meds with sedative side effects, cautioned against driving when sleepy.  I am happy with his current compliance( by hisotry) & feel we have met our objectives with CPAP. Discussed maintenance of machine. He will call for changes  Orders: Est. Patient Level III (52841)  Medications Added to Medication List This Visit: 1)  Vitamin D3 2000 Unit Caps (Cholecalciferol) .... Take 1 tablet by mouth once a day 2)  Probiotic Caps (Probiotic product) .... Take 1 capsule by mouth once a day  Patient  Instructions: 1)  Copy sent to: 2)  Please schedule a follow-up appointment in 1 year.

## 2010-02-13 NOTE — Letter (Signed)
Summary: Generic Letter  Architectural technologist, Main Office  [address]   [place], [place] [address]   Phone: [phone]  Fax: [phone]        August 22, 2009 MRN: [id number]    Re:   [name]   [address]   [place], [place]  [address]    To Whom It May Concern,  [name] has been a heart patient of ours for a number of years. He had 2 heart attacks in 2009 and has coronary stents in all 3 coronary vessels. He is doing much better from the standpoint of his heart.   Recently he has had symptoms of fatigue and cloudy mental status which may be related to his medications. We have altered his medications in attempt to improve the symptoms.  The symptoms have intermittently caused him to be unable to work in his usual capacity.  I be happy to answer further questions regarding his medical condition.  Yours Truly,  Charlies Constable         Sincerely,  Bruce Rosana Berger, MD, [facility]  This letter has been electronically signed by your physician.

## 2010-02-13 NOTE — Progress Notes (Signed)
Summary: Calling about a letter (talk w/ BB)  Phone Note Call from Patient Call back at [phone]   Caller: Patient Summary of Call: Pt calling regarding a letter from Dr.Moser Initial call taken by: Judie Grieve,  August 05, 2009 1:40 PM  Follow-up for Phone Call        I called and spoke with the pt. He was inquiring if we had received a letter from Dr. Roxy Cedar office (neurology at [facility] in [facility]). I made him aware I have not seen a letter. The pt states it addresses that his medictions may be related to his problems. He states he took  a trial off all his medictions, then restarted them. He has been taking ASA once daily, metoprolol tart 1/2 tab once daily, plavix every other day, and he states he retried Crestor last night and that is what made him feel bad. He reports episodes of fast heart rates during the night. I explained he is only getting about 12 hours of coverage with the metoprolol tart once daily. He will restart the evening dose. I have asked that he call Dr. Roxy Cedar office and request they send their office note to [place]. I will review the above with [name] tomorrow. The pt is agreeable. Follow-up by: [name], RN, BSN,  August 05, 2009 3:12 PM

## 2010-02-13 NOTE — Miscellaneous (Signed)
Summary: TRA2p study completion  Clinical Lists Changes  Medications: Removed medication of * TRAP 2 STUDY DRUGS take as directed      Comments study completion

## 2010-02-13 NOTE — Assessment & Plan Note (Signed)
Summary: rov   Visit Type:  Follow-up/ research dopplers Referring Provider:  [name] Primary Provider:  lucy barden p.a. (Dr. Derrell Lolling)   History of Present Illness: The patient is 55 years old and return for management of CAD. He had an inferior MI in 2009 treated with a drug-eluting stent to the right coronary artery. Later in 2009 he had a non-ST elevation MI treated with drug-eluting stents to the circumflex and LAD. He later had a followup catheter which showed nonobstructive disease.  Most recently he has had symptoms of fatigue and a feeling of altered mental status where his mind feels very cloudy. He relates this to some of his medicines and he temporarily went off all his medicines and felt better for a time. He saw Dr. Maple Hudson with [facility] for some of the symptoms recently and he referred him back here for more evaluation and assessment.  He indicated that he misses about 3-4 days of months because of some of the symptoms.  Current Medications (verified): 1)  Trap 2 Study Drugs .... Take As Directed 2)  Fish Oil 1000 Mg Caps (Omega-3 Fatty Acids) .Marland Kitchen.. 1 Tab Once Daily 3)  Vitamin D3 2000 Unit Caps (Cholecalciferol) .... Take 1 Tablet By Mouth Once A Day 4)  Plavix 75 Mg Tabs (Clopidogrel Bisulfate) .... Take 1 Tab By Mouth Daily 5)  Crestor 20 Mg Tabs (Rosuvastatin Calcium) .... Take One Tablet By Mouth Daily. 6)  Metoprolol Tartrate 25 Mg Tabs (Metoprolol Tartrate) .... Take One-Half Tablet By Mouth Twice A Day 7)  Aspirin 81 Mg Tbec (Aspirin) .... Take One Tablet By Mouth Daily 8)  Centrum Silver  Tabs (Multiple Vitamins-Minerals) .... Take One Tablet By Mouth Once Daily. 9)  Nitrostat 0.4 Mg Subl (Nitroglycerin) .Marland Kitchen.. 1 Tablet Under Tongue At Onset of Chest Pain; You May Repeat Every 5 Minutes For Up To 3 Doses. 10)  Meloxicam 7.5 Mg Tabs (Meloxicam) .... One Tab By Mouth Once Daily As Needed 11)  Probiotic  Caps (Probiotic Product) .... Take 1 Capsule By Mouth Once  A Day  Allergies (verified): 1)  ! Pcn 2)  ! * Stepomycin 3)  Amoxicillin (Amoxicillin)  Past History:  Past Medical History: Reviewed history from 08/16/2008 and no changes required. IMPRESSION: 1. Coronary artery disease, status post diaphragmatic wall infarction     treated with a drug-eluting stent to the right coronary 7/09 wih subsequent DES to LAD and CFX and    non-obstrucive disease at last cath. 2. Good left ventricular function. 3. Symptoms of fatigue and decreased exercise tolerance. 4. Hyperlipidemia. 5. Gastroesophageal reflux disease.  6. Sleep Apnea  Review of Systems       ROS is negative except as outlined in HPI.   Vital Signs:  Patient profile:   55 year old male Height:      72 inches Weight:      158 pounds BMI:     21.51 Pulse rate:   69 / minute BP sitting:   112 / 76  (left arm) Cuff size:   regular  Vitals Entered By: Burnett Kanaris, CNA (August 22, 2009 2:28 PM)  Physical Exam  Additional Exam:  Gen. Well-nourished, in no distress   Neck: No JVD, thyroid not enlarged, no carotid bruits Lungs: No tachypnea, clear without rales, rhonchi or wheezes Cardiovascular: Rhythm regular, PMI not displaced,  heart sounds  normal, no murmurs or gallops, no peripheral edema, pulses normal in all 4 extremities. Abdomen: BS normal, abdomen soft and non-tender  without masses or organomegaly, no hepatosplenomegaly. MS: No deformities, no cyanosis or clubbing   Neuro:  No focal sns   Skin:  no lesions    Impression & Recommendations:  Problem # 1:  FATIGUE (ICD-780.79)  Orders: TLB-CK-MB (Creatine Kinase MB) (82553-CKMB)  Problem # 2:  HYPERLIPIDEMIA-MIXED (ICD-272.4) We will change his Crestor to pravastatin as described above. His updated medication list for this problem includes:    Pravastatin Sodium 10 Mg Tabs (Pravastatin sodium) .Marland Kitchen... Take one tablet by mouth daily at bedtime  His updated medication list for this problem includes:     Pravastatin Sodium 10 Mg Tabs (Pravastatin sodium) .Marland Kitchen... Take one tablet by mouth daily at bedtime  Problem # 3:  OBSTRUCTIVE SLEEP APNEA (ICD-780.57) This has been managed by Dr. Vassie Loll.  Patient Instructions: 1)  Stop Crestor. 2)  Start Pravasatin 10mg  once daily. 3)  Stop the metoprolol that you are currently taking. We are going to switch you to another form. 4)  Start Metoprolol succinate 25mg  1/2 tab by mouth once daily. 5)  Your physician recommends that you schedule a follow-up appointment in: 3 months. 6)  Labwork today: CK-MB (414.01;272.2) Prescriptions: METOPROLOL SUCCINATE 25 MG XR24H-TAB (METOPROLOL SUCCINATE) Take 1/2  tablet by mouth daily  #30 x 6   Entered by:   [name], RN, BSN   Authorized by:   [name], MD, [facility]   Signed by:   [name], RN, BSN on 08/22/2009   Method used:   Electronically to        [facility] [address] (retail)       [address]       [place], [place]  [id number]       Ph: [phone]       Fax: [phone]   RxID:   [id number] PRAVASTATIN SODIUM 10 MG TABS (PRAVASTATIN SODIUM) Take one tablet by mouth daily at bedtime  #30 x 6   Entered by:   [name], RN, BSN   Authorized by:   [name], MD, [facility]   Signed by:   [name], RN, BSN on 08/22/2009   Method used:   Electronically to        [facility] (retail)       [address]       [place], [place]  [id number]       Ph: [phone]       Fax: [phone]   RxID:   [id number]

## 2010-02-13 NOTE — Miscellaneous (Signed)
Summary: Orders Update  Clinical Lists Changes  Orders: Added new Test order of Arterial Duplex Lower Extremity (Arterial Duplex Low) - Signed 

## 2010-02-13 NOTE — Letter (Signed)
Summary: [facility]   [facility]   Imported By: Marylou Mccoy 08/09/2009 14:50:48  _____________________________________________________________________  External Attachment:    Type:   Image     Comment:   External Document

## 2010-02-13 NOTE — Progress Notes (Signed)
Summary: bloodwork needed   Phone Note Call from Patient   Caller: Patient [phone] Reason for Call: Talk to Nurse Summary of Call: pt thought he was to have blood work per dr Juanda Chance thought he would be called to set up Initial call taken by: Glynda Jaeger,  November 19, 2009 10:45 AM  Follow-up for Phone Call        [facility]. [name], RN, BSN  November 19, 2009 11:44 AM   pt returned call-pls call back [phone] Glynda Jaeger  November 19, 2009 11:52 AM  I spoke with the pt. He will come tomorrow. Follow-up by: [name], RN, BSN,  November 19, 2009 12:08 PM

## 2010-04-18 LAB — POCT I-STAT, CHEM 8
BUN: 14 mg/dL (ref 6–23)
Calcium, Ion: 1.15 mmol/L (ref 1.12–1.32)
HCT: 44 % (ref 39.0–52.0)
Hemoglobin: 15 g/dL (ref 13.0–17.0)
Sodium: 141 mEq/L (ref 135–145)
TCO2: 28 mmol/L (ref 0–100)

## 2010-04-18 LAB — PROTIME-INR
INR: 1 (ref 0.00–1.49)
Prothrombin Time: 13 seconds (ref 11.6–15.2)

## 2010-04-18 LAB — POCT CARDIAC MARKERS: Myoglobin, poc: 67 ng/mL (ref 12–200)

## 2010-04-18 LAB — TROPONIN I: Troponin I: 0.01 ng/mL (ref 0.00–0.06)

## 2010-04-18 LAB — CARDIAC PANEL(CRET KIN+CKTOT+MB+TROPI)
CK, MB: 1 ng/mL (ref 0.3–4.0)
CK, MB: 1.2 ng/mL (ref 0.3–4.0)
Relative Index: 0.7 (ref 0.0–2.5)
Total CK: 137 U/L (ref 7–232)
Total CK: 145 U/L (ref 7–232)
Troponin I: 0.02 ng/mL (ref 0.00–0.06)
Troponin I: 0.02 ng/mL (ref 0.00–0.06)

## 2010-04-18 LAB — CBC
MCV: 92 fL (ref 78.0–100.0)
Platelets: 176 10*3/uL (ref 150–400)
RDW: 13.3 % (ref 11.5–15.5)
WBC: 5.7 10*3/uL (ref 4.0–10.5)

## 2010-04-18 LAB — CK TOTAL AND CKMB (NOT AT ARMC): Total CK: 135 U/L (ref 7–232)

## 2010-05-02 ENCOUNTER — Encounter: Payer: Self-pay | Admitting: Cardiovascular Disease

## 2010-05-02 ENCOUNTER — Ambulatory Visit (INDEPENDENT_AMBULATORY_CARE_PROVIDER_SITE_OTHER): Payer: BC Managed Care – PPO | Admitting: Cardiovascular Disease

## 2010-05-02 VITALS — BP 115/76 | HR 68 | Ht 72.0 in | Wt 154.0 lb

## 2010-05-02 DIAGNOSIS — I2510 Atherosclerotic heart disease of native coronary artery without angina pectoris: Secondary | ICD-10-CM

## 2010-05-02 DIAGNOSIS — R5381 Other malaise: Secondary | ICD-10-CM

## 2010-05-02 DIAGNOSIS — R5383 Other fatigue: Secondary | ICD-10-CM

## 2010-05-02 DIAGNOSIS — E785 Hyperlipidemia, unspecified: Secondary | ICD-10-CM

## 2010-05-02 NOTE — Assessment & Plan Note (Signed)
Will check TSH, CBC. 

## 2010-05-02 NOTE — Assessment & Plan Note (Signed)
Will continue statin and check lipids and LFTs next week.

## 2010-05-02 NOTE — Progress Notes (Signed)
History of Present Illness:55 yo male with history of CAD, OSA, hyperlipidemia here today for cardiac follow up. He has been followed in the past by [name]. He had an inferior MI in 2009 treated with a drug-eluting stent to the right coronary artery. In December  2009 he had a non-ST elevation MI treated with drug-eluting stents to the circumflex and LAD. He  had a followup catheterization in February 2010  which showed nonobstructive disease.  He has been doing well. He describes occasional daytime fatigue. He also has episodes of sweating. Occasional arm and chest pains. No symptoms similar to his presentation before his stents were placed. He has not used any NTG at home.    Past Medical History  Diagnosis Date  . Coronary artery disease   . Hyperlipidemia     No past surgical history on file.  Current Outpatient Prescriptions  Medication Sig Dispense Refill  . aspirin 81 MG tablet Take 81 mg by mouth daily.        . clopidogrel (PLAVIX) 75 MG tablet Take 75 mg by mouth daily.        . famotidine (PEPCID) 40 MG tablet Take 40 mg by mouth daily.        . metoprolol succinate (TOPROL-XL) 25 MG 24 hr tablet Take 25 mg by mouth daily. 1/2 tab daily       . rosuvastatin (CRESTOR) 20 MG tablet Take 20 mg by mouth daily. 1/2 tab daily         Allergies  Allergen Reactions  . Amoxicillin     REACTION: unspecified  . Penicillins     History   Social History  . Marital Status: Married    Spouse Name: N/A    Number of Children: N/A  . Years of Education: N/A   Occupational History  . Not on file.   Social History Main Topics  . Smoking status: Not on file  . Smokeless tobacco: Not on file  . Alcohol Use: Not on file  . Drug Use: Not on file  . Sexually Active: Not on file   Other Topics Concern  . Not on file   Social History Narrative  . No narrative on file    No family history on file.  Review of Systems:  As stated in the HPI and otherwise negative.   BP  115/76  Pulse 68  Ht 6' (1.829 m)  Wt 154 lb (69.854 kg)  BMI 20.89 kg/m2  Physical Examination: General: Well developed, well nourished, NAD HEENT: OP clear, mucus membranes moist SKIN: warm, dry. No rashes. Neuro: No focal deficits Musculoskeletal: Muscle strength 5/5 all ext Psychiatric: Mood and affect normal Neck: No JVD, no carotid bruits, no thyromegaly, no lymphadenopathy. Lungs:Clear bilaterally, no wheezes, rhonci, crackles Cardiovascular: Regular rate and rhythm. No murmurs, gallops or rubs. Abdomen:Soft. Bowel sounds present. Non-tender.  Extremities: No lower extremity edema. Pulses are 2 + in the bilateral DP/PT.  EKG:NSR, rate 68 bpm.

## 2010-05-02 NOTE — Patient Instructions (Signed)
Your physician recommends that you schedule a follow-up appointment in: 6 months with Dr. Clifton James  Your physician recommends that you return for a FASTING lipid profile on Monday 05/05/10 anytime after 8:30am.

## 2010-05-02 NOTE — Assessment & Plan Note (Addendum)
Stable. Will continue ASA and Plavix, beta blocker, statin.

## 2010-05-05 ENCOUNTER — Other Ambulatory Visit (INDEPENDENT_AMBULATORY_CARE_PROVIDER_SITE_OTHER): Payer: BC Managed Care – PPO | Admitting: *Deleted

## 2010-05-05 DIAGNOSIS — R5381 Other malaise: Secondary | ICD-10-CM

## 2010-05-05 DIAGNOSIS — R5383 Other fatigue: Secondary | ICD-10-CM

## 2010-05-05 DIAGNOSIS — E785 Hyperlipidemia, unspecified: Secondary | ICD-10-CM

## 2010-05-05 LAB — CBC WITH DIFFERENTIAL/PLATELET
Basophils Absolute: 0 10*3/uL (ref 0.0–0.1)
Basophils Relative: 0.7 % (ref 0.0–3.0)
Eosinophils Absolute: 0.1 10*3/uL (ref 0.0–0.7)
Lymphocytes Relative: 20.1 % (ref 12.0–46.0)
MCHC: 34.3 g/dL (ref 30.0–36.0)
MCV: 91 fl (ref 78.0–100.0)
Monocytes Absolute: 0.5 10*3/uL (ref 0.1–1.0)
Neutrophils Relative %: 66.4 % (ref 43.0–77.0)
RBC: 4.75 Mil/uL (ref 4.22–5.81)
RDW: 13.5 % (ref 11.5–14.6)

## 2010-05-05 LAB — HEPATIC FUNCTION PANEL
ALT: 22 U/L (ref 0–53)
AST: 24 U/L (ref 0–37)
Albumin: 4.5 g/dL (ref 3.5–5.2)
Alkaline Phosphatase: 55 U/L (ref 39–117)
Bilirubin, Direct: 0.1 mg/dL (ref 0.0–0.3)
Total Protein: 7 g/dL (ref 6.0–8.3)

## 2010-05-05 LAB — LIPID PANEL
Cholesterol: 172 mg/dL (ref 0–200)
Triglycerides: 119 mg/dL (ref 0.0–149.0)

## 2010-05-27 NOTE — Assessment & Plan Note (Signed)
[facility]                            CARDIOLOGY OFFICE NOTE   WELLINGTON, WINEGARDEN                        MRN:          [id number]  DATE:12/27/2007                            DOB:          10/15/1955    PRIMARY CARE PHYSICIAN:  Synetta Fail, MD.   CLINICAL HISTORY:  Mr. Angelos is 55 years old who came for cardiology  evaluation __________  Mr. Ordaz works __________ difficulty in  __________ he underwent repeat angiography and had an intervention  __________.  He has done quite well since that time.  He says he feels  much better.  He has had no chest pain, angina, shortness of breath or  palpitations __________.   He has a past medical history __________   Medications include Plavix, metoprolol, aspirin, __________ and Crestor.  Crestor is reduced from 40 to 20 a day __________ has normalized  __________.   PHYSICAL EXAMINATION:  VITAL SIGNS:  His blood pressure 124/84, his  pulse 65 and regular.  The venous pulse waves were visible.  Carotid  pulses were full without bruits.  CHEST:  Clear without rales or rhonchi.  CARDIAC:  Rhythm was regular. Heart sounds were normal. There were no  murmurs or gallops.  ABDOMEN:  Soft with normal bowel sounds.  There is no  hepatosplenomegaly.  EXTREMITIES:  Peripheral pulses are full.  There is no peripheral edema.   An electrocardiogram was normal.   IMPRESSION:  1. Coronary artery disease status post diaphragmatic wall infarction      July 2009 treated with a drug-eluting stent to the right coronary      artery and status post recent transient cerebral ischemia with drug-      eluting stents to the circumflex and left anterior descending.  2. Hyperlipidemia.  3. Elevated CK, on Crestor.  4. Good left ventricular function.  5. Gastroesophageal reflux disease.   RECOMMENDATIONS:  I think Mr. Studnicka is doing much better.  We will  plan to let him go back to rehab.  He would like to come and  return to  work a part time and we will allow him to do that.  I will see him back  in 4 weeks.  We will get a lipid profile and CK and MB before that  follow up visit.    Bruce Elvera Lennox Juanda Chance, MD, [facility]  Electronically Signed   BRB/MedQ  DD: 12/27/2007  DT: 12/28/2007  Job #: [id number]

## 2010-05-27 NOTE — H&P (Signed)
[name], PETT NO.:  [id number]   MEDICAL RECORD NO.:  [id number]          PATIENT TYPE:  OBV   LOCATION:  3736                         FACILITY:  [facility]   PHYSICIAN:  Bevelyn Buckles. Bensimhon, MDDATE OF BIRTH:  1956-01-06   DATE OF ADMISSION:  12/21/2007  DATE OF DISCHARGE:                              HISTORY & PHYSICAL   PRIMARY CARE PHYSICIAN:  Dr. Floreen Comber, PA-C   PRIMARY CARDIOLOGIST:  Everardo Beals. Juanda Chance, MD, [facility]   CHIEF COMPLAINT:  Chest pain.   HISTORY OF PRESENT ILLNESS:  [name] is a 55 year old male with a  history of coronary artery disease.  He was at cardiac rehab today and  doing an exercise capacity test.  Therefore, his exertion level is  higher than usual.  He developed substernal and left-sided chest pain.  When his symptoms did not resolve, he received sublingual nitroglycerin  a total of 3 times.  He describes the pain as a heaviness.  It does not  radiate.  He had some shortness of breath, but no nausea, vomiting, or  diaphoresis.  It reached to 6/10.  After 3 nitroglycerin, it decreased  to 1/10, but was ongoing.  The symptoms reminded him of his pre PCI  pain.  He was sent to the emergency room for further evaluation.  This  is the first episode of chest pain he has had like this since his PCI in  July 2009.  Approximately 1 week ago, he saw his family physician for  chest pain and back pain that was described as a sharp pain.  At that  time, blood work was done which showed elevated liver functions and his  Crestor was decreased, but those symptoms, he states, were not like his  pre PCI symptoms.   PAST MEDICAL HISTORY:  1. Inferior-posterior ST elevation MI in July 2009 with drug-eluting      stent to the RCA.  2. Residual coronary artery disease at cath in July 2009 with LAD 70%      and circumflex 70-80%.  3. Preserved left ventricular function with an EF of 60% at cath.  4. Hyperlipidemia.  5. Gastroesophageal reflux  disease/hiatal hernia.   SURGICAL HISTORY:  Status post cath as well as tonsillectomy and hydro-  varicocele repair.   ALLERGIES:  MYCINS and PENICILLIN.   CURRENT MEDICATIONS:  1. Plavix 75 mg daily.  2. Metoprolol 25 mg 1/2 tablet b.i.d.  3. Crestor 40 mg 1/2 tablet daily.  4. Aspirin 325 mg daily.  5. Nexium 40 mg a day.  6. Sublingual nitroglycerin p.r.n.   SOCIAL HISTORY:  Lives in [place] with his wife and works as an Social research officer, government.  He denies any alcohol, tobacco, or drug abuse.   FAMILY HISTORY:  His parents are both alive.  His mother is 35, his  father is 42, and neither of his parents nor siblings have coronary  artery disease.  However, he had a grandfather that died at age 72 after  multiple MIs.   REVIEW OF SYSTEMS:  He has not had any recent illnesses, fevers, chills,  or sweats.  He has not had arthralgias or joint pains.  He feels his  reflux symptoms are well controlled on the Nexium and there has been no  melena or bright red blood per rectum.  The chest pain as described  above.  He has minimal dyspnea on exertion.  Full 14-point review of  systems is otherwise negative.   PHYSICAL EXAMINATION:  VITAL SIGNS:  Temperature is 97.9, blood pressure  127/81, pulse 72, respiratory rate 20, and O2 saturation 99% on room  air.  GENERAL:  He is a well-developed, well-nourished white male in no acute  distress.  HEENT:  Normal.  NECK:  There is no lymphadenopathy, thyromegaly, bruit, or JVD noted.  CARDIOVASCULAR:  His heart is regular rate and rhythm with an S1 and S2.  No significant murmur, rub, or gallop is noted.  Distal pulses are  intact in all 4 extremities.  LUNGS:  Essentially clear to auscultation bilaterally.  SKIN:  No rashes or lesions are noted.  ABDOMEN:  Soft and nontender with active bowel sounds.  EXTREMITIES:  There is no cyanosis, clubbing, or edema noted.  MUSCULOSKELETAL:  There is no joint deformity or effusions and no sign  of CVA  tenderness.  NEUROLOGIC:  He is alert and oriented.  Cranial nerves II through XII  grossly intact.   Chest x-ray and labs are pending.   EKG is sinus rhythm rate 70 with no acute ischemic changes.  Inverted T  waves seen on an EKG from August 2009, have improved.   IMPRESSION:  Mr. Camino was seen today by Dr. Gala Romney.  The patient's  symptoms remind him of his pre percutaneous coronary intervention  symptoms.  He will be admitted and we will rule out myocardial  infarction.  Because of his ongoing pain, he will be started on heparin  and nitroglycerin.  His symptoms began with exertion, but at a higher  level than usual and he has residual coronary artery disease that to be  treated medically.  At this time, we will plan on catheterization to  reassess his anatomy with further evaluation and treatment depending on  the results.  He will be continued on all of his home medications.      Theodore Demark, PA-C      Bevelyn Buckles. Bensimhon, MD  Electronically Signed    RB/MEDQ  D:  12/21/2007  T:  12/22/2007  Job:  [id number]

## 2010-05-27 NOTE — Assessment & Plan Note (Signed)
[facility]                            CARDIOLOGY OFFICE NOTE   [name], [name]                        MRN:          [id number]  DATE:11/17/2007                            DOB:          Nov 03, 1955    PRIMARY CARE PHYSICIAN:  [name], M.D.   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  On July 21, 2007, he had a diaphragmatic wall infarction, treated with a Promus  stent to the right coronary artery.  He had residual 70% stenosis in the  LAD and circumflex artery.  He has had persistent symptoms of fatigue  despite being in rehab since that time, and we did a Myoview scan on him  recently which showed no evidence of ischemia and an ejection fraction  of 66% and no residual scar.   He says he has had occasional pain, but most of these are sharp and  fleeting.  His main problem is fatigue and feeling washed out at the end  of the day.  This has improved slightly over the past 4 weeks, but not  much.   PAST MEDICAL HISTORY:  1. GERD.  2. Hyperlipidemia.   CURRENT MEDICATIONS:  1. Plavix.  2. Metoprolol 25 mg 1/2 tablet b.i.d.  3. Crestor 40 mg.  4. Aspirin.  5. Nexium.  6. Fish oil.  7. TRA2P study drug.   PHYSICAL EXAMINATION:  VITAL SIGNS:  Blood pressure is 118/78, pulse 74  and regular.  NECK:  There was no venous distention.  The carotid pulses were full  without bruits.  CHEST:  Clear.  CARDIAC:  Rhythm was regular.  I could hear no murmurs or gallops.  ABDOMEN:  Soft, normal bowel sounds.  There is no hepatosplenomegaly.  EXTREMITIES:  Peripheral pulses were full and no peripheral edema.   An electrocardiogram was normal.   IMPRESSION:  1. Coronary artery disease, status post diaphragmatic wall infarction      treated with a drug-eluting stent to the right coronary with      residual 70% stenosis in the left anterior descending and  circumflex arteries, July 2009.  2. Good left ventricular function.  3. Symptoms of fatigue and decreased exercise tolerance.  4. Hyperlipidemia.  5. Gastroesophageal reflux disease.   RECOMMENDATIONS:  [name] is still having symptoms of fatigue.  I do  not think we blame this on ischemia with his residual anatomy and good  Myoview scan.  I will plan to talk to [name] or [name] in the rehab  program and see if we can extend his rehab program and perhaps modify  it.  He feels he can return to work part time and I agree with that and  will put some restrictions on what he can do.  I will plan to see him  back in 6 weeks and will decide about increasing his work activity  depending on how he is doing.     Bruce Elvera Lennox Juanda Chance, MD, [facility]  Electronically Signed  BRB/MedQ  DD: 11/17/2007  DT: 11/17/2007  Job #: [id number]

## 2010-05-27 NOTE — H&P (Signed)
[name], Cooper NO.:  [id number]   MEDICAL RECORD NO.:  [id number]          PATIENT TYPE:  INP   LOCATION:  6522                         FACILITY:  [facility]   PHYSICIAN:  Pricilla Riffle, MD, FACCDATE OF BIRTH:  01-18-1955   DATE OF ADMISSION:  08/29/2007  DATE OF DISCHARGE:                              HISTORY & PHYSICAL   PRIMARY CARDIOLOGIST:  Everardo Beals. Juanda Chance, MD, [facility]   PRIMARY CARE Shanon Becvar:  Malka So, MD in [facility].   PATIENT PROFILE:  This is a 55 year old Asian male with recent  inferior/posterior ST-segment-elevation MI with drug-eluting stenting in  the mid RCA who presents with recurrent chest discomfort and fatigue  since discharge.   PROBLEMS:  1. Coronary artery disease.  1a.  July 21, 2007, acute inferoposterior ST-elevation myocardial  infarction.  1b.  July 21, 2007, cardiac catheterization/percutaneous coronary  intervention.  Left main normal.  Left anterior descending 70% mid  tandem stenoses.  Left circumflex 70-80% proximal.  Right coronary  artery 99% mid.  Ejection fraction 60% with inferior hypokinesis.  The  right coronary artery was stented with 3.0 x 15-mm PROMUS drug-eluting  stent.  1. Hyperlipidemia.  2. Gastroesophageal reflux disease.  3. History of hydrocele and varicocele repair.  4. Status post tonsillectomy at about age 26.   HISTORY OF PRESENT ILLNESS:  This is a 55 year old Asian male with  recent inferoposterior ST-elevation MI on July 21, 2007, with subsequent  PCI and drug-eluting stent placement in the mid RCA.  He has known  residual 70% tandem stenosis in the LAD and 70-80% stenosis in the left  circumflex.  Following discharge, he notes significant fatigue after  taking his a.m. medications and overall low energy he states since then.  I think that his beta blocker may be contributing significantly.  He  also noted intermittent left-sided sharp chest pain occurring at rest  approximately 2-3 times per  week lasting about 3 minutes and resolving  spontaneously.  At times, his sharp chest pain has been accompanied by  substernal chest pressure which he rated about 5/10 and is similar to  his previous MI symptoms.  When he has the chest pressure, he also has  taken sublingual nitroglycerin on 3 occasions with immediate relief.   The patient also reports occasional fluttering in the left chest and  tachy-palpitations lasting couple of minutes and resolving  spontaneously.  There are no social symptoms with his palpitations.  He  has also noted some lightheadedness that occurs when he is walking at  the cardiac rehab.  His baseline blood pressures have been in the low  100s to 110 and at recent one occasion his blood pressure is documented  to be 90 systolic following an episode of lightheadedness with  ambulation.   Today, [name] presented to his primary care Sarya Linenberger secondary to  the constellation of complaints as described above.  There an ECG was  performed and showed inferior Qs with T-wave inversion in III and aVF.  This was apparently compared to an ECG from August 2008 (prior to his  MI)  and the patient was sent to [facility] ED after further session with  Dr. Valera Castle.  Currently, the patient denies any chest pain, dyspnea,  presyncope, although does note that following initiation of oxygen  therapy that he did overall feel somewhat better.   ALLERGIES:  PENICILLIN.   HOME MEDICATIONS:  1. Aspirin 325 mg daily.  2. Crestor 40 mg daily.  3. Lopressor 12.5 mg b.i.d.  4. Plavix 75 mg daily.  5. Nexium 40 mg b.i.d.   FAMILY HISTORY:  Mother is alive at age 38 with borderline diabetes.  Father is alive at age 61 with hypertension.  He has a brother who has  hyperlipidemia.  He has 3 sisters, 1 of which was recently diagnosed  with diabetes.   SOCIAL HISTORY:  Lives in [place] with his wife.  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  He denies tobacco or drug use.  Occasionally will have an  alcoholic beverage.  He has been exercising 3 times a week at cardiac  rehab.  Of note, he has never had any chest discomfort symptoms with his  cardiac rehab.   REVIEW OF SYSTEMS:  Positive for chest discomfort both sharp and  pressure like occurring at rest.  Positive for tachy-palpitations.  Otherwise, all systems reviewed are negative.   PHYSICAL EXAMINATION:  VITAL SIGNS:  Temperature 98.1, heart rate 64,  respirations 20, blood pressure 124/79, and pulse ox 97% on room air.  GENERAL:  Pleasant Asian male in no acute distress.  Awake, alert, and  oriented x3.  HEENT:  Normal.  NEURO:  Grossly intact, nonfocal.  SKIN:  Warm and dry without lesions or masses.  NECK:  No bruits or JVD.  LUNGS:  Respirations are regular and unlabored.  Clear to auscultation.  CARDIAC:  Regular S1 and S2.  No S3, S4, or murmurs.  ABDOMEN:  Round, soft, nontender, and nondistended.  Bowel sounds are  present x4.  EXTREMITIES:  Warm and dry.  No clubbing, cyanosis, or edema.  Dorsalis  pedis, posterior tibial pulses 2+ and equal bilaterally.  No femoral  bruits noted.   His chest x-ray shows no acute chest disease.  EKG shows sinus rhythm,  normal axis, rate of 63 beats per minute, inferior Qs and T-wave  inversion.   LABORATORY WORK:  Hemoglobin 15.0, hematocrit 45.2, WBC 5.8, and  platelets 185.  Sodium 140, potassium 4.8, chloride 105, CO2 27, BUN 17,  creatinine 1.2, and glucose 93.  CK-MB 1.9, troponin-I 0.10.   ASSESSMENT AND PLAN:  1. Chest pain/coronary artery disease.  The patient presents with      typical and atypical features.  Notably, his chest pain symptoms      occur at rest and he has not had any exertional symptoms despite on      the cardiac rehab 3 times a week.  He has known residual circumflex      and LAD disease.  ECG shows T-wave inversion and inferior Qs and      these changes were  present on his July 22, 2007, ECG prior to      discharge.  Of note, his troponin is elevated at 0.10 by point of      care.  We will plan to admit and cycle cardiac markers.  Add      heparin.  Continue aspirin, statin, and Plavix.  Cardiac markers  are negative.  We will review his films and consider Myoview to      rule out ischemia in the LAD and circumflex territories.  If      enzymes are positive, we will plan cardiac catheterization.  2. Fatigue/presyncope.  The patient gets lightheaded with exercise and      has been noted to have some blood pressure drops with cardiac      rehab.  He has also had fairly significant fatigue and low energy      state since initiation of beta-blocker therapy.  We will decrease      his beta blocker dose to 6.25 daily and see if he could tolerate      this.  We may need to discontinue it altogether.  3. Tachy-palpitations.  The patient gives a history of occasional      fluttering in the left chest and increased heart rate.  We will      monitor telemetry and check TSH.  His electrolytes are okay.  Check      a mag.  4. Hyperlipidemia.  Total cholesterol on July 22, 2007, was 220 with      an LDL 167.  He has been on Crestor for 44 months now.  Plan to      repeat lipids and LFTs and continue Crestor.      Nicolasa Ducking, ANP      Pricilla Riffle, MD, [facility]  Electronically Signed    CB/MEDQ  D:  08/29/2007  T:  08/30/2007  Job:  [id number]

## 2010-05-27 NOTE — Assessment & Plan Note (Signed)
[facility]ALTHCARE                            CARDIOLOGY OFFICE NOTE   MATTIE, NOVOSEL                        MRN:          [id number]  DATE:07/27/2007                            DOB:          20-May-1955    HISTORY OF PRESENT ILLNESS:  Mr. Narine has had a cardiac  catheterization on July 21, 2007.  He called the office this morning  complaining of some soreness and bruising of the right groin.  Margorie John, one of our nurses consulted me and we brought him in for a  venous Doppler of his right femoral artery.  It shows no obstruction, no  pseudoaneurysm, or AV fistula formation.   He has had no foot pain, numbness, or tingling.  He does notice a knot  over the right superficial femoral artery.   PHYSICAL EXAMINATION:  On my exam, he has a cannulation track of scar  tissue, small hematoma at the insertion site.  There is no bruit.  He  has a large superficial ecchymosis, it is now turning yellow.  There is  no sign of any surrounding hematoma.  His distal pulses were intact.  There is no edema.  There is no sign of DVT.   I have reassured Mr. Arnette that his vessel was not damaged  anatomically.  This will slowly resolve over the next 3-4 weeks.  I have  told him to avoid any heavy lifting.     Thomas C. Daleen Squibb, MD, [facility]  Electronically Signed    TCW/MedQ  DD: 07/27/2007  DT: 07/28/2007  Job #: [id number]

## 2010-05-27 NOTE — Cardiovascular Report (Signed)
NAMELATREL, SZYMCZAK NO.:  [id number]   MEDICAL RECORD NO.:  [id number]          PATIENT TYPE:  OBV   LOCATION:  6529                         FACILITY:  [facility]   PHYSICIAN:  Verne Carrow, MDDATE OF BIRTH:  Sep 27, 1955   DATE OF PROCEDURE:  12/22/2007  DATE OF DISCHARGE:                            CARDIAC CATHETERIZATION   PRIMARY CARDIOLOGIST:  Everardo Beals. Juanda Chance, MD, [facility].   PROCEDURE PERFORMED:  1. Percutaneous coronary intervention of the proximal circumflex      artery with placement of a Xience drug-eluting stent.  2. FFR of the mid left anterior descending.  3. Percutaneous coronary intervention of the mid left anterior      descending with placement of a Taxus drug-eluting stent.  4. Placement of an Angio-Seal femoral artery closure device.    Dictation ended at this point.      Verne Carrow, MD     CM/MEDQ  D:  12/22/2007  T:  12/23/2007  Job:  [id number]

## 2010-05-27 NOTE — Discharge Summary (Signed)
NAMERAVON, MORTELLARO NO.:  [id number]   MEDICAL RECORD NO.:  [id number]           PATIENT TYPE:   LOCATION:                                 FACILITY:   PHYSICIAN:  Bruce R. Juanda Chance, MD, FACCDATE OF BIRTH:  12-May-1955   DATE OF ADMISSION:  DATE OF DISCHARGE:                               DISCHARGE SUMMARY   ADDENDUM   MEDICATIONS:  Aspirin 325, Plavix 75, Crestor 20, and metoprolol 25  b.i.d.  The patient is instructed to stop his Nexium and use ranitidine  over the counter, Protonix 40 mg daily, and nitroglycerin as needed.  He  has prescription for the Plavix, Crestor, Protonix, and nitroglycerin.  He has been given the postcardiac catheterization discharge  instructions.  Please follow up with [name] on December 27, 2007, at  10 a.m.      Dorian Pod, ACNP      Bruce R. Juanda Chance, MD, [facility]  Electronically Signed    MB/MEDQ  D:  12/23/2007  T:  12/23/2007  Job:  [id number]

## 2010-05-27 NOTE — Cardiovascular Report (Signed)
NAMECALTON, HARSHFIELD NO.:  [id number]   MEDICAL RECORD NO.:  [id number]          PATIENT TYPE:  OIB   LOCATION:  2903                         FACILITY:  [facility]   PHYSICIAN:  Everardo Beals. 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  He has no prior history of heart disease.  He had chest pain yesterday  and developed recurrent chest pain today, at 12:45 and [facility].  His ECG showed a diaphragmatic wall infarction  and was transmitted to [facility] and was brought directly to the  Catheterization Laboratory by [facility]  He has no history of  hypertension or diabetes or smoking.   PROCEDURE:  The procedure was performed by the right femoral arterial  sheath and 6-French preformed coronary catheters.  A front wall arterial  puncture was performed and Omnipaque contrast was used.  After it was  recognized there was totally occluded right coronary artery, preparation  was made for intervention.  The patient was given weight adjusted  heparin following ACT greater than 200 seconds.  Our plan was to give  intracoronary abciximab through the ClearWay catheter.  The patient also  had been given chewable aspirin by EMS and received 600 mg of Plavix and  12 mg of Pepcid here in the lab before the procedure.   We used a 6-French JR-4 guiding catheter with side holes.  A Prowater  wire was passed across the lesion without difficulty.  We then passed a  1.5 x 20 mm ClearWay catheter just across the lesion and infused one  bolus injection of abciximab through the infusion catheter.  This  reestablished flow and left the lesion with a clean appearance.  We then  predilated the lesion with a 2.5 x 50-mm Apex balloon performing two  inflations up to 10 atmospheres for 30  seconds.  We then deployed a 3.0  x 15 mm PROMUS stent deploying this with one inflation of 12 atmospheres  for 30 seconds.  We postdilated with a 3.25 x 12-mm Quantum Maverick  performing two inflations up to 60 atmospheres for 30 seconds.   FINAL DIAGNOSIS:  Implantable guiding catheter.  The patient tolerated  the procedure well and left the lab in satisfactory condition.   RESULTS:  The aortic pressure was 109/76 with a mean of 92 and left neck  pressure was 109/25.   Left main coronary artery.  The left main coronary artery was free of  significant disease.   Left anterior descending artery.  The left anterior descending artery  gave rise to two diagonal branch and two septal perforators and two more  diagonal branches.  The LAD was irregular.  There was a 70% narrowing in  the mid vessel which was hypodense.  There was another tandem 70% lesion  in the mid vessel which was focal.   Circumflex artery.  The circumflex artery gave rise to a ramus branch,  two atrial branches and a marginal branch.  There was 70-80% stenosis in  the proximal portion of the marginal branch.   Right coronary artery.  The right coronary artery was a moderate-sized  vessel that gave rise to a conus branch, a right ventricular branch and  then had a 99% stenosis in the midportion with just a trickle of dye  distally.   Left ventriculogram:  The left ventriculogram was performed in RAO  projection showed inferior wall hypokinesis.  Estimated ejection  fraction was 60%.   Following intracoronary abciximab and following PTCA and stenting, the  right coronary artery stenosis improved from 99% to 0% and the flow  improved from TIMI I to TIMI III.  The distal vessel consists of  posterior descending and posterolateral branch.  There is 50% narrowing  in the midportion of the posterior descending branch.   The patient had the onset of chest pain at 12:45 and arrived in the Cath  Lab by [facility]  EMS at 15:20.  The first balloon inflation was at  15:44.  This gave a total balloon time of 24 minutes and reperfusion  time of 2 hours and 59 minutes.   CONCLUSION:  1. Acute diaphragmatic wall infarction with 99% stenosis in the mid      LAD with TIMI I flow, a 70-80% stenosis of proximal circumflex      artery, tandem 70% stenoses in the mid LAD and inferior wall      hypokinesis.  2. Successful reperfusion, intracoronary abciximab and stenting of the      mid-right coronary artery lesion with improvement of stent      narrowing from 99% to 0% and improvement flow from TIMI I to TIMI      III flow.   DISPOSITION:  The patient return to Postanesthesia Care Unit for further  observation.  I think the lesions in the LAD and circumflex artery are  probably not quite tight enough to warrant intervention without prior  functional testing.  We will plan exercise Cardiolite scan when the  patient is recovered from his MI.      Bruce Elvera Lennox Juanda Chance, MD, [facility]  Electronically Signed     BRB/MEDQ  D:  07/21/2007  T:  07/22/2007  Job:  [id number]   cc:   Dr. Derrell Lolling  Cardiopulmonary Lab

## 2010-05-27 NOTE — Assessment & Plan Note (Signed)
[facility] HEALTHCARE                            CARDIOLOGY OFFICE NOTE   JAHKING, LESSER                        MRN:          [id number]  DATE:10/12/2007                            DOB:          [birth year]    PRIMARY CARE PHYSICIAN:  Chyrel Masson, MD   CLINICAL HISTORY:  Mr. Sanfilippo is 55 years old and returned for a  followup management of his coronary heart disease.  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  On July 21, 2007, he had a diaphragmatic wall infarction treated with a PROMUS stent  to the right coronary artery and residual 70% lesions in the LAD and  circumflex artery.  He was readmitted with pain and had an adenosine  Myoview scan, which showed no ischemia.   He has been in the rehab program but has had difficulty getting his  energy back.  He says he gives out quite easily on the treadmill and on  the bike and he feels exhausted the next day.  He had some of these  symptoms leading up to his heart attack, but prior to that, he was very  energetic and this is a different situation for him.  He has had some  chest pain, but this has not been related to exertion and has been left  sided, sharp, and beating.   His past medical history is significant for GERD and hyperlipidemia.   His current medications include Nexium, Plavix, metoprolol, Crestor, and  aspirin.   On examination, the blood pressure is 119/81 and pulse 90 and regular.  There was no venous distention.  The pulses were full without bruits.  Chest was clear.  Cardiac rhythm was regular.  He had no murmurs, rubs,  or gallops.  The abdomen was soft without organomegaly.  Peripheral  pulses were full.  There was no peripheral edema.   IMPRESSION:  1. 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  2. Good left ventricular function with ejection fraction of 60%.  3. Symptoms of fatigue and decreased exercise tolerance.  4. Hyperlipidemia.  5. Gastroesophageal reflux disease.   RECOMMENDATIONS:  Mr. Ingalls is still having difficulty with his energy  level and fatigue.  I am not certain if this is related to his residual  coronary artery disease or not.  We previously checked his hemoglobin  and TSH and they are okay.  There may be some element of anxiety and  depression related to his symptoms.  We will plan to evaluate him with  an exercise rest  stress Myoview scan since I think this will give [place] a better handle on  his symptoms and ischemia than the adenosine scan over this next week,  and I will see him back in 4 weeks.     Smitty Cords  R. Juanda Chance, MD, [facility]  Electronically Signed    BRB/MedQ  DD: 10/12/2007  DT: 10/12/2007  Job #: [id number]

## 2010-05-27 NOTE — Letter (Signed)
November 17, 2007    [name]  [address]  [place], [place] [address]   RE:  [name], [name]  MRN:  [id number]  /  DOB:  03/29/1955   To Whom it May Concern:   Mr. Walbert is a patient of mine, who I have been treating for coronary  heart disease.  He has recovered well from his heart attack, but is  still in the process of undergoing rehabilitation.  At this point, I  think he is able to return to work on a part-time basis and would  recommend half-time or 4 hours a day.  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  I think he could  return to work beginning anytime after the first of next week.    Sincerely,      Bruce R. Juanda Chance, MD, [facility]  Electronically Signed    BRB/MedQ  DD: 11/17/2007  DT: 11/17/2007  Job #: [id number]   CC:    Renne Musca

## 2010-05-27 NOTE — Assessment & Plan Note (Signed)
[facility] HEALTHCARE                            CARDIOLOGY OFFICE NOTE   [name], [name]                        MRN:          [id number]  DATE:08/10/2007                            DOB:          February 08, 1955    PRIMARY CARDIOLOGIST:  Everardo Beals. Juanda Chance, MD, [facility]   [name] is a 55 year old patient of Dr. Charlies Constable who presented  with DMI and underwent successful stenting of the mid RCA on July 21, 2007.  He has residual 70% mid LAD and 70%-80% proximal circumflex with  inferior wall hypokinesis.  Since the patient has been home, he did have  venous Doppler of his right femoral artery, which showed no obstruction  or pseudoaneurysm or AV fistula, and he was checked by Dr. Daleen Squibb.  He now  is in for his regular followup.  He has had a lot of trouble with  heartburn.  He was afraid the Nexium was causing interference with his  Plavix, so he tried to switch to over-the-counter Prevacid.  This did  not work and he is back on his Nexium and feeling much better.  His main  complaint is fatigue and not having the energy to do a whole lot.  He is  scheduled to start cardiac rehab tomorrow.   CURRENT MEDICATIONS:  1. Nexium 40 mg b.i.d.  2. Plavix 75 mg daily.  3. Metoprolol 25 mg one-half b.i.d.  4. Crestor over 40 mg nightly.  5. Aspirin 325 a day.   PHYSICAL EXAM:  GENERAL:  This is a very pleasant 55 year old male in no  acute distress.  VITAL SIGNS:  Blood pressure 121/79, orthostatics were performed, he is  not orthostatic, pulse is 75, weight 157.  NECK:  Without JVD, HJR, bruit, or thyroid enlargement.  LUNGS:  Clear anterior, posterior, and lateral.  HEART:  Regular rate and rhythm at 75 beats per minute.  Normal S1 and  S2.  No murmur, rub, bruit, thrill, or heave noted.  ABDOMEN:  Soft without organomegaly, masses, lesions, or abnormal  tenderness.  Right groin, small knot, no hematoma or hemorrhage.  EXTREMITIES:  Lower extremities without  cyanosis, clubbing, or edema.  He has good distal pulses.   EKG, normal sinus rhythm, inferior Q-waves, no acute change.   IMPRESSION:  1. Coronary artery disease status post diaphragmatic myocardial      infarction treated with stenting of the right coronary artery on      07/21/2007 with residual 70% mid left anterior descending and 70%-      80% proximal circumflex with inferior hypokinesis, ejection      fraction 60%.  2. Hyperlipidemia.  3. Gastroesophageal reflux disease.   PLAN:  The patient is doing well from a cardiac standpoint.  I told him  to wait until he gets through cardiac rehab, and we will reevaluate him  for his lack of energy and possible relation to the beta-blocker.  He  will see [name] back in 1-2 months and call sooner if needed.      Jacolyn Reedy, PA-C  Electronically Signed  Madolyn Frieze Jens Som, MD, [facility]  Electronically Signed   ML/MedQ  DD: 08/10/2007  DT: 08/11/2007  Job #: [id number]

## 2010-05-27 NOTE — Assessment & Plan Note (Signed)
[name]                            CARDIOLOGY OFFICE NOTE   [name], [name]                        MRN:          [id number]  DATE:03/01/2008                            DOB:          1955-05-31    PRIMARY CARE PHYSICIAN:  Dr. Synetta Cooper.   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  He had a diaphragmatic wall infarction in  2009, treated with drug-eluting stent.  He subsequently had drug-eluting  stents that placed to the LAD and circumflex artery by Dr. Clifton Cooper.  Despite this, he still has had recurrent chest pain, which is sometimes  related to exertion and sometimes at rest.  This is interfering with his  activities.  He did not go to work this morning because of chest pain.  Since he has had trouble sleeping at night, because of chest pain.  He  also has quite a bit of anxiety associated with his chest pain and his  cardiac condition.   PAST MEDICAL HISTORY:  Significant for hyperlipidemia.  He has also had  an elevated CK on Crestor and has responded to a decreased dose.   CURRENT MEDICATIONS:  1. Plavix.  2. Metoprolol 25 mg one-half tablet b.i.d.  3. Aspirin 325 mg daily.  4. Fish oil.  5. Crestor 40 mg one-half tablet daily.  6. Pepcid AC.   PHYSICAL EXAMINATION:  VITAL SIGNS:  The blood pressure was 124/84 and a  pulse 84 and regular.  NECK:  There is no venous distention.  The carotid pulses are full  without bruits.  CHEST:  Clear.  HEART:  Rhythm is regular.  No murmurs or gallops.  ABDOMEN:  Soft.  Normal bowel sounds.  Peripheral pulses were full with  no peripheral edema.   IMPRESSION:  1. Coronary artery disease, status post prior diaphragmatic wall      infarctions and status post triple-vessel percutaneous coronary      intervention with recurrent chest pain.  2. Good left ventricular function.  3. Hyperlipidemia.  4. Elevated creatine phosphokinase on high-dose  Crestor.  5. Gastroesophageal reflux disease.  6. Anxiety.   RECOMMENDATIONS:  I think the best course at this point is to reevaluate  [name] with angiography.  We set this up for a week from Friday in  the [facility] lab.  If she has an area of ischemia, then we will decide about  the appropriate treatment.  If he has done, if his cath  suggests that there is no source of ischemia, then we will consider  possible psychological consultation to help him dealing with his  anxiety.     [name] Elvera Lennox Juanda Chance, MD, [facility]  Electronically Signed    BRB/MedQ  DD: 03/01/2008  DT: 03/02/2008  Job #: [id number]   cc:   Dr. Synetta Cooper

## 2010-05-27 NOTE — Procedures (Signed)
NAMELYSLE, YERO NO.:  [id number]   MEDICAL RECORD NO.:  [id number]          PATIENT TYPE:  OUT   LOCATION:  SLEEP CENTER                 FACILITY:  [facility]   PHYSICIAN:  Barbaraann Share, MD,FCCPDATE OF BIRTH:  1956-01-04   DATE OF STUDY:  05/21/2008                            NOCTURNAL POLYSOMNOGRAM   REFERRING PHYSICIAN:  Bruce R. Juanda Chance, MD, [facility]   INDICATION FOR STUDY:  Hypersomnia with sleep apnea.   EPWORTH SLEEPINESS SCORE:  6.   MEDICATIONS:   SLEEP ARCHITECTURE:  The patient had a total sleep time of 237 minutes  with no slow wave sleep and only 43 minutes of REM.  Sleep onset latency  was prolonged at 46 minutes, and REM onset was prolonged as well at 138  minutes.  Sleep efficiency was poor at 61%.   RESPIRATORY DATA:  The patient was found to have 5 obstructive apneas  and 13 hypopneas for an apnea-hypopnea index of 5 events per hour.  He  was also noted to have 203 respiratory effort-related arousals, giving  him a respiratory disturbance index of 56 events per hour.  There was  mild-to-moderate snoring noted throughout.  The patient did not meet  split-night criteria secondary to very few of his events being true  apneas and hypopneas.   OXYGEN DATA:  There was O2 desaturation as low as 78% with his  obstructive events.   CARDIAC DATA:  Occasional PVCs, but no clinically significant arrhythmia  seen.   MOVEMENT-PARASOMNIA:  There were no significant leg jerks or abnormal  behavior seen.   IMPRESSIONS-RECOMMENDATIONS:  1. At least mild-to-moderate obstructive sleep apnea with most of the      patient's events being represented by respiratory effort-related      arousals.  The patient had an apnea-hypopnea index of 5 events per      hour, and a respiratory disturbance index of 56 events per hour.      There was O2 desaturation as low as 78%.  Treatment for this degree      of sleep apnea can include a trial of weight loss if  applicable,      upper airway surgery, oral appliance,      and also continuous positive airway pressure.  Clinical correlation      is suggested.  2. Occasional premature ventricular contraction, but no clinically      significant arrhythmia seen.      Barbaraann Share, MD,FCCP  Diplomate, American Board of Sleep  Medicine  Electronically Signed     [facility]/MEDQ  D:  06/04/2008 18:55:25  T:  06/05/2008 06:41:35  Job:  [id number]

## 2010-05-27 NOTE — Assessment & Plan Note (Signed)
[facility] HEALTHCARE                            CARDIOLOGY OFFICE NOTE   BURLEIGH, BROCKMANN                        MRN:          [id number]  DATE:09/05/2007                            DOB:          03/23/1955    PRIMARY CARE PHYSICIAN:  Dr. Derrell Lolling.   CLINICAL HISTORY:  Mr. 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  He presented  on July 21, 2007, with a diaphragmatic wall infarction, was treated with  a PROMUS drug-eluting stent.  He had residual 70% lesions in the  circumflex and LAD.  He was readmitted to the hospital on August 29, 2007, with chest pain and had an adenosine Myoview scan, which showed no  evidence of ischemia.   He has continued to have symptoms of fatigue.  He also notes he is quite  sensitive to heat and he gets more fatigued with that.  He was washing  his car 1 day and felt completely washed out from that.  He has also had  some occasional palpitations.  He has not returned to work.  He had been  in the rehab program until his hospitalization on August 29, 2007, but  has not been back yet.   His past medical history is significant for GERD and hyperlipidemia.   His current medications include Nexium, Plavix, metoprolol 25 mg one-  half tablet b.i.d., Crestor, and aspirin.   On examination, blood pressure was 110/80 and the pulse 72 and regular.  There was no venous tension.  The carotid pulses were full without  bruits.  Chest was clear.  Heart, rhythm was regular.  I could hear no  murmurs or gallops.  Abdomen was soft with normal bowel sounds.  Peripheral pulses were full with no peripheral edema.   Electrocardiogram showed recent diaphragmatic wall infarction.   IMPRESSION:  1. Coronary artery disease status post recent diaphragmatic wall      infarction treated with a drug-eluting stent to the right coronary      artery with residual 70% stenosis in the mid LAD  and 70%-80%      stenosis in the circumflex artery.  2. Normal left ventricular function with an ejection fraction of 60%.  3. Hyperlipidemia.  4. Gastroesophageal reflux disease.   RECOMMENDATIONS:  Mr. Hayhurst is having persistent symptoms of fatigue.  He has had only atypical chest pain since his discharge from the  hospital.  We will plan to approve him to get back in rehab and see if  we can increase his exercise activity.  If he is not doing well and has  chest pain or poor exercise tolerance then I will consider doing an  exercise Myoview scan,  so we can judge his exercise tolerance.  We will keep him out of work  until I see him back in 4 weeks.     Bruce Elvera Lennox Juanda Chance, MD, [facility]  Electronically Signed    BRB/MedQ  DD: 09/05/2007  DT: 09/06/2007  Job #: [id number]

## 2010-05-27 NOTE — Assessment & Plan Note (Signed)
[facility] HEALTHCARE                            CARDIOLOGY OFFICE NOTE   SHLOK, RAZ                        MRN:          [id number]  DATE:01/27/2008                            DOB:          [birth year]    PRIMARY CARE PHYSICIAN:  Cheryl Flash with [facility].   CLINICAL HISTORY:  Mr. 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  He had a diaphragmatic wall infarction in 2009 treated with a  drug-eluting stent.  He had continued pain following that, and we  evaluated him with a Myoview scan, which was negative.  He was admitted  recently with recurrent chest pain and had positive enzymes consistent  with a non-ST-elevation myocardial infarction.  He underwent stenting of  the circumflex artery and LAD with drug-eluting stents by Dr. Clifton James.   Since that time, he has continued to have chest pain.  He describes this  is left sided.  He feels different than the pain he had with his recent  acute coronary artery syndrome and that there is not pressure involved.  The pain seems to be precipitated by cold or by anxiety.   PAST MEDICAL HISTORY:  Significant for hyperlipidemia.  He also has  history of elevated CK on Crestor, and we decreased his dose from 40-20.   CURRENT MEDICATIONS:  1. Plavix.  2. Aspirin.  3. Metoprolol 25 mg one-half tablet b.i.d.  4. Fish oil.  5. Crestor 40 mg one-half tablet daily.  6. Protonix 40 mg daily.   PHYSICAL EXAMINATION:  VITAL SIGNS:  Blood pressure is 120/80 and the  pulse is 81 and regular.  NECK:  There was no venous distension.  The carotid pulses were full  without bruits.  CHEST:  Clear.  CARDIAC:  Rhythm was regular.  I could hear no murmurs or gallops.  ABDOMEN:  Soft with normal bowel sounds.  EXTREMITIES:  Peripheral pulses were full.  There was no peripheral  edema.   IMPRESSION:  1. 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  2. Hyperlipidemia.  3. Elevated CPK on Crestor.  4. Good left ventricular function.  5. Gastroesophageal reflux disease.   RECOMMENDATIONS:  Unfortunately, Mr. Pasha still has chest pain.  He  also has a lot of anxiety.  He has also had dizziness, weakness, and low  blood pressure.  His pulse rates, however, have been in the 90s.  He has  been restricting his salt, and I told him that not restrict his salt and  hopefully his blood pressure will increase.  I would rather not to cut  back on his metoprolol with his fast heart rate.  Unfortunately, stress  test has not been very helpful in evaluating his chest pain in the past.  We will plan  to see him back in about 4 weeks, and if he is still having  pain, we will consider evaluation with angiography.  He may also need  some help in dealing with anxiety.  He would like to try to work this on  his own for the next 4 weeks.  If he will not make any progress, we will  consider either a referral to Dr. Dellia Cloud or possibly some antianxiety  medications.  We will get a lipid and liver profile in the next few days  and decide if we need to make any adjustments in his therapies.  We will  try to get his LDL down to less than 20.  I will talk to him about  possibly substituting Zantac or Protonix because of potential  interaction with Plavix.     Bruce Elvera Lennox Juanda Chance, MD, [facility]  Electronically Signed    BRB/MedQ  DD: 01/27/2008  DT: 01/28/2008  Job #: [id number]

## 2010-05-27 NOTE — H&P (Signed)
NAMEJEREMI, [name] NO.:  [id number]   MEDICAL RECORD NO.:  [id number]          PATIENT TYPE:  OIB   LOCATION:  2903                         FACILITY:  [facility]   PHYSICIAN:  [name] Beals. Juanda Chance, MD, FACCDATE OF BIRTH:  1955-01-15   DATE OF ADMISSION:  07/21/2007  DATE OF DISCHARGE:                              HISTORY & PHYSICAL   PRIMARY CARDIOLOGIST:  [facility] being seen by Dr. Juanda Cooper.   PRIMARY CARE PHYSICIAN:  Dr. Derrell Lolling in [facility].   PATIENT PROFILE:  A 55 year old Asian male without prior cardiac history  who presents with acute inferoposterior ST-segment elevation myocardial  infarction.   PROBLEM LIST:  1. Acute inferoposterior ST-segment elevation myocardial      infarction/coronary artery disease.  2. Gastroesophageal reflux disease.  3. History or hydrocele and varicocele status post surgery.   HISTORY OF PRESENT ILLNESS:  This is a 55 year old male without prior  cardiac history who was in his usual state of health until yesterday  when he developed substernal chest pressure at rest lasting about 1  hour, resolving spontaneously.  He had a recurrent episode of chest  discomfort this afternoon while walking to launch and then a second  episode this afternoon while walking from [place] at approximately 12:45.  At this time, symptoms were associated with shortness of breath and  diaphoresis.  He eventually saw his employer and subsequently EMS was  activated approximately 2 hours after onset of pain.  ECG upon EMS  arrival showed 2-3 mm ST segment elevation in inferior leads with T-wave  inversion and ST-segment depression in the leads aVL, V1, and V2.  Code  stenting was activated and the patient was taken to the [facility]  Lab; we arrived at 15:20.  Currently, he continues to complain of chest  pain on a nonrebreather mask.  Cardiac catheterization is ongoing.  Onset of pain at 12:45 p.m., EMS activation 14:43 p.m., and cath lab  arrival 15:20 p.m.   ALLERGIES:  PENICILLIN and STREPTOMYCIN.   HOME MEDICATION:  Nexium 40 mg daily.  He has been treated with 4 baby  aspirin and 1 sublingual nitroglycerin by EMS.   FAMILY HISTORY:  Not able to obtain secondary to acuity.   SOCIAL HISTORY:  Lives in [place] with his wife.  He works as an  Systems developer.  He denies tobacco abuse.  He occasionally has an alcoholic  beverage.  Denies drug abuse.   REVIEW OF SYSTEMS:  Unable to obtain secondary to acuity.   PHYSICAL EXAMINATION:  VITAL SIGNS:  Heart rate 84, respirations 18,  blood pressure 135/96, and pulse ox 100% on 100% nonrebreather.  GENERAL:  Pleasant male in no acute distress.  Awake and oriented x3.  HEENT:  Normal nares, grossly intact.  Nonfocal.  SKIN:  Warm and dry without lesions or masses.  NECK:  No bruits or JVD.  LUNGS:  Respirations are regular and unlabored, clear to auscultation.  CARDIAC:  Regular S1 and S2.  No S3 or S4.  No murmurs.  ABDOMEN:  Round, soft, nontender, and nondistended.  Bowel sounds  present  x4.  EXTREMITIES:  Warm, dry, and pink.  No clubbing, cyanosis, or edema.  Dorsalis pedis and posterior tibial pulses 2+ and equal bilaterally.   ACCESSORY CLINICAL FINDINGS:  Chest x-ray and lab work is pending.  EKG  shows sinus rhythm.  The rate is 72, normal axis, 34-mm ST-elevation, 2-  3 aVF, 2-3 mm ST depression, T-wave inversion in V1, V2, and aVL.   ASSESSMENT AND PLAN:  1. Acute inferoposterior ST-segment elevation myocardial      infarction/coronary artery disease.  Preliminary cath results      revealed 100% mid right coronary artery stenosis and is currently      undergoing percutaneous intervention.  We will plan to add aspirin,      Plavix, statin, as well as beta-blocker as tolerated.  He has had      some bradycardia and hypertension in the lab.  Pending LV function.      We will consider ACE inhibitor therapy if tolerated.  2. Interventional cardiac rehab.  3. Lipid  status, currently unknown.  Check lipids, LFTs.  Add high      dose statin.  4. History of gastroesophageal reflux disease.  We will hold off on      PPI at this time.      Nicolasa Ducking, ANP      Bruce R. Juanda Chance, MD, [facility]  Electronically Signed    CB/MEDQ  D:  07/21/2007  T:  07/22/2007  Job:  [id number]

## 2010-05-27 NOTE — Discharge Summary (Signed)
NAMEWOODROW, DRAB               ACCOUNT NO.:  [id number]   MEDICAL RECORD NO.:  [id number]          PATIENT TYPE:  INP   LOCATION:  6522                         FACILITY:  [facility]   PHYSICIAN:  Everardo Beals. Juanda Chance, MD, FACCDATE OF BIRTH:  02/06/1955   DATE OF ADMISSION:  08/29/2007  DATE OF DISCHARGE:  08/30/2007                               DISCHARGE SUMMARY   PRIMARY CARDIOLOGIST:  Everardo Beals. Juanda Chance, MD, [facility]   PRIMARY CARE PHYSICIAN:  Dr. Malka So in [facility].   FINAL DISCHARGE DIAGNOSES:  1. Atypical chest pain.  2. Coronary artery disease.      a.     July 21, 2007, acute inferior-posterior ST elevated       myocardial infarction.      b.     July 21, 2007, cardiac catheterization with PCI.  Left main       normal.  Left anterior descending 70% mid tandem stenosis.  Left       circumflex 70-80%.  Proximal right coronary artery 99% .  Mid       ejection fraction 60% with inferior hypokinesis.  Right coronary       artery was stented with a 3.0 x 15-mm PROMUS drug-eluting stent.  3. Hyperlipidemia.  4. Gastroesophageal reflux disease.  5. History of hydrocele and variceal repair.   PROCEDURES PERFORMED DURING HOSPITALIZATION:  1. Stress Myoview.      a.     No evidence of ischemia, EF of 60%, evidence of inferior       posterior MI.   HOSPITAL COURSE:  A 55 year old Asian male with recent inferior  posterior ST elevated MI and subsequent PCI with drug-eluting stent to  the right coronary artery with residual disease, had significant fatigue  after taking his a.m. medications and with overall low energy and he  states then he has beta-blocker.  He tells [place] the beta-blocker was  contributing to this significantly.  Also noted left-sided sharp chest  pain occurring at rest approximately 2-3 times a week lasting about 3  minutes and resolving spontaneously.  The patient also reports  occasional flutter in his left chest and tachy palpitations lasting a  couple of minutes  and resolving spontaneously.  The patient was seeing  his primary care physician secondary to his multiple complaints.  EKG  performed showed inferior Qs with T-wave inversion in leads III and aVF  compared to EKG in October 2008;  it was abnormal.  The patient was sent  over to [facility] for reevaluation secondary to his symptoms and EKG.  The patient was seen and examined in the emergency room by Dietrich Pates,  MD and Nicolasa Ducking, nurse practitioner.  The patient was admitted  to rule out myocardial infarction in the setting of recurrent chest pain  with known history of CAD.  He had cardiac enzymes cycled and was found  to be negative.  During hospitalization, the patient's Lopressor was  discontinued, but he was continued on other medications.  The patient's  troponin was 0.01, 0.01, and 0.01 respectively and he had a followup  adenosine Myoview stress test on 08/30/2007 which was found to be  negative for ischemia.  The patient will be discharged home on  medications that he was taking prior to admission and will follow with  Dr. Charlies Constable on a previously scheduled appointment on September 05, 2007, at 8:45 a.m.   DISCHARGE LABS:  TSH 2.162, magnesium 2.3, troponins negative x4 as  stated above.  Sodium 138, potassium 3.5, chloride 104, CO2 of 29,  glucose 97, BUN 13, creatinine 0.97, cholesterol 140, triglycerides 67,  HDL 39, and LDL 88.  Hemoglobin 14.2, hematocrit 41.3, white blood cells  5.9, and platelets 174.   DISCHARGE MEDICATIONS:  1. Nexium 40 mg daily.  2. Metoprolol 12.5 mg b.i.d.  3. Aspirin 325 daily.  4. Plavix 75 mg daily.  5. Crestor 1 tablet 40 mg at bedtime.  6. Nitroglycerin sublingual p.r.n.   ALLERGIES:  PENICILLIN and STREPTOMYCIN.   FOLLOWUP PLANS AND APPOINTMENTS:  1. The patient to a follow up with Dr. Charlies Constable on September 05, 2007, at 8:45 a.m.  2. The patient to follow up with Dr. Derrell Lolling, primary care physician as      needed for  medical management.  3. The patient has been advised on refilling nitroglycerin for home      use as necessary every 3 months.  4. The patient has been advised to not do any strenuous activities and      hold off on cardiac rehab until seen by [name].  The patient      verbalizes understanding.   Time spent with the patient to include physician time 30 minutes.      Bettey Mare. Lyman Bishop, NP      Everardo Beals. Juanda Chance, MD, [facility]  Electronically Signed    KML/MEDQ  D:  08/30/2007  T:  08/31/2007  Job:  [id number]

## 2010-05-27 NOTE — Cardiovascular Report (Signed)
[name], [name] NO.:  [id number]   MEDICAL RECORD NO.:  [id number]          PATIENT TYPE:  OBV   LOCATION:  6529                         FACILITY:  [facility]   PHYSICIAN:  Verne Carrow, MDDATE OF BIRTH:  26-Feb-1955   DATE OF PROCEDURE:  12/22/2007  DATE OF DISCHARGE:                            CARDIAC CATHETERIZATION   PRIMARY CARDIOLOGIST:  Everardo Beals. Juanda Chance, MD, [facility]   PROCEDURES PERFORMED:  1. Percutaneous coronary intervention of the proximal circumflex      artery with placement of a XIENCE drug-eluting stent.  2. Percutaneous coronary intervention of the mid left anterior      descending with placement of a Taxus drug-eluting stent.  3. Fractional flow reserve of the mid left anterior descending.  4. Placement of Angio-Seal femoral artery closure device.   OPERATOR:  Verne Carrow, MD   INDICATIONS:  Chest pain.   DETAILS OF PROCEDURE:  The diagnostic catheterization was performed by  Dr. Arvilla Meres.  The patient was found to have a severe stenosis  of the proximal circumflex artery as well as a moderate to severe  stenosis in the mid LAD.  The patient was agreeable to proceeding with a  percutaneous intervention with stent placement.  An Angiomax bolus was  given and a drip was started.  The patient was given 300 mg of Plavix in  the Cath Lab.  I first turned my attention to the proximal circumflex  lesion.  A 6-French XB LAD 3.5 guiding catheter was used to selectively  engage the left main coronary artery.  A Cougar intracoronary wire was  then passed down the circumflex artery without difficulty.  A 2.5 x 12  mm balloon was inflated to 10 atmospheres for pre-dilatation.  A 2.5 x  15 mm XIENCE drug-eluting stent was then deployed in the area of  stenosis.  A 2.75 x 12 mm Noncompliant balloon was used for post-  dilatation of the lesion.  Prior to the intervention, the stenosis was  80% and following the intervention was 0%.   The lesion in the mid LAD was of questionable significance.  I elected  to perform a fractional flow reserve of this lesion.  The baseline FFR  was 0.94.  After giving 48 mcg of adenosine, the FFR was 0.81.  I then  gave 84 mcg of adenosine and the FFR was 0.81.  I felt that based on the  angiographic appearance and the FFR that we should intervene on this  lesion in the mid LAD.  The wire used for the pressure measurement was  used for the intervention.  A 2.5 x 12 mm balloon was inflated at 2  different locations in the midportion of the vessel.  A 2.75 x 32 mm  Taxus drug-eluting stent was then placed in the mid LAD.  A 2.75 x 20 mm  Noncompliant balloon was used for post-dilatation of the lesion.  The  stenosis prior to the intervention was 80% and following the  intervention was 0% in both locations in the mid LAD.   The patient tolerated the procedure well and was taken  to the holding  area in stable condition.  An Angio-Seal femoral artery closure device  was placed in the arteriotomy site in the right femoral artery prior to  the patient leaving the Cath Lab.   IMPRESSION:  Successful percutaneous coronary intervention of the  proximal circumflex artery with placement of a drug-eluting stent and  successful percutaneous intervention in the mid left anterior descending  with placement of a drug-eluting stent.   RECOMMENDATIONS:  The patient should be continued on his current medical  therapy as well as aspirin and Plavix for at least 1 year.      Verne Carrow, MD  Electronically Signed     CM/MEDQ  D:  12/22/2007  T:  12/23/2007  Job:  [id number]

## 2010-05-27 NOTE — Cardiovascular Report (Signed)
[name], [name]               ACCOUNT NO.:  [id number]   MEDICAL RECORD NO.:  [id number]          PATIENT TYPE:  OIB   LOCATION:  1962                         FACILITY:  [facility]   PHYSICIAN:  Bruce R. Juanda Chance, MD, FACCDATE OF BIRTH:  1955/08/31   DATE OF PROCEDURE:  03/09/2008  DATE OF DISCHARGE:  03/09/2008                            CARDIAC CATHETERIZATION   CLINICAL HISTORY:  [name] is 56 years old and works at [employer] in  [place].  He had a diaphragmatic wall infarction in 2009, treated with  a Promus stent to the mid-to-distal right coronary artery.  He later had  2-vessel PCI of the LAD and circumflex artery with drug-eluting stent by  Dr. Clifton James.   Since that time, he has continued to have chest pain which has been  somewhat atypical for ischemia.  He has also had a lot of anxiety.  I  admitted the patient to evaluate him further.  Stress tests have not  been too helpful in the past, so we brought him in for angiography  today.   PROCEDURE:  The procedure was performed via the right femoral artery  using arterial sheath and 4-French preformed coronary catheters.  A  front wall arterial puncture was performed and Omnipaque contrast was  used.  The patient tolerated the procedure well and left the laboratory  in satisfactory condition.   RESULTS:  The aortic pressure was 128/80 and left ventricular pressure  128/24.   The left main coronary artery:  The left main coronary artery was free  of significant disease.   Left anterior descending artery:  The left anterior descending artery  gave rise to 2 diagonal branches, septal perforator, and then 2 more  diagonal branches.  There was 0% stenosis at the long stent in the mid  LAD.  There was 50% narrowing in the distal LAD after the stent.   The circumflex artery:  The circumflex artery gave rise to a ramus  branch, an atrial branch, and a large marginal branch.  There was 0%  stenosis at the stent site in the  proximal to mid circumflex artery.   The right coronary artery:  The right coronary artery was a moderate-  sized vessel, gave rise to a right ventricle branch, posterior  descending branch, and 2 posterolateral branches.  There was 30-40%  narrowing in the proximal midvessel.  There was 0% stenosis at the stent  site in the mid-to-distal vessel.   The left ventriculogram:  The left ventriculogram performed in the RAO  projection showed slight hypokinesis of mid inferior wall.  The overall  wall motion was good and estimated ejection fraction was 60%.   CONCLUSION:  Coronary artery disease, status post prior diaphragmatic  wall infarction treated with PCI and subsequent elective PCI of the LAD  and circumflex artery with 0% stenosis at the stent site in the mid LAD  and 50% narrowing in the distal LAD after the stent, 0% stenosis at the  stent site in the proximal to mid circumflex  artery, 30-40% narrowing in the proximal mid right coronary and 0%  stenosis at  the stent site in the mid-to-distal right coronary with  slight inferior wall hypokinesis and an estimated ejection fraction of  60%.   DISPOSITION:  The patient returned to [facility] for further  observation.      Bruce Elvera Lennox Juanda Chance, MD, [facility]  Electronically Signed     BRB/MEDQ  D:  03/09/2008  T:  03/10/2008  Job:  [id number]   cc:   Dr. Synetta Fail

## 2010-05-27 NOTE — Cardiovascular Report (Signed)
[name], [name] NO.:  [id number]   MEDICAL RECORD NO.:  [id number]          PATIENT TYPE:  OBV   LOCATION:  6529                         FACILITY:  [facility]   PHYSICIAN:  Bevelyn Buckles. Bensimhon, MDDATE OF BIRTH:  20-Aug-1955   DATE OF PROCEDURE:  12/22/2007  DATE OF DISCHARGE:                            CARDIAC CATHETERIZATION   PATIENT IDENTIFICATION:  [name] is a very pleasant 55 year old man  with a history of coronary artery disease.  He experienced an inferior  ST-elevation myocardial infarction in July 2009 and underwent cardiac  catheterization by [name].  At that time, he placed a PROMUS drug-  eluting stent to mid distal RCA.  It was noted that he had borderline  lesions in both left circumflex and LAD which were treated medically.  He has been in cardiac rehab for 3 months and doing well.  However, he  was undergoing a stress test yesterday to proceed into the maintenance  phase and during fairly high work load had a 6/10 chest pain.  The pain  resolved with rest.  He was brought to the emergency room and we  admitted him.  His cardiac enzymes have been negative.  He was brought  for diagnostic angiography.   PROCEDURES PERFORMED:  1. Selective coronary angiography.  2. Left heart cath.  3. Left ventriculogram.   DESCRIPTION OF PROCEDURE:  The risks and indication of the  catheterization were explained, consent was signed and placed on the  chart.  A 5-French arterial sheath was placed in the right femoral  artery using a modified Seldinger technique.  Standard catheters  including JL-4, JR-4, and angled pigtail were used for procedure.  All  catheter exchanges made over a wire.  No apparent complications.  Central aortic pressure was 104/64 with a mean of 83.  LV pressure was  104/2 with an EDP of 11.   Left main was normal.   LAD was a moderate-sized vessel coursing to the apex.  It gave off a  single diagonal.  In the mid-LAD after a  septal perforator, there was a  70-80% focal lesion, this was followed by another 70% lesion and a 60%  lesion downstream.   The left circumflex gave off a small OM-1 and large OM-2.  Proximal to  mid AV groove circumflex, there was an 80% focal lesion.   Right coronary was a large dominant artery gave off PDA and  posterolateral.  There was a 30% tubular lesion proximally and a 40%  lesion in the midsection.  The drug-eluting stent in the mid-to-distal  RCA was widely patent.  There was a 40-50% lesion in the mid PDA and a  30% lesion in the distal RCA leading into the posterolateral.   Left ventriculogram done by hand injection showed an EF of 55% with  severe inferobasilar hypokinesis.   ASSESSMENT:  1. Three-vessel coronary artery disease as described above with a      widely patent RCA stent.  2. High-grade lesion in the left circumflex.  3. Borderline lesions in the left anterior descending.   PLAN:  As discussion,  I have reviewed the films with Dr. Clifton James and  Dr. Excell Seltzer.  We will proceed with percutaneous intervention and stenting  of the circumflex lesion and flow wire interrogation of the LAD lesion  with possible stenting.      Bevelyn Buckles. Bensimhon, MD  Electronically Signed     DRB/MEDQ  D:  12/22/2007  T:  12/23/2007  Job:  [id number]

## 2010-05-27 NOTE — Discharge Summary (Signed)
[name], [name] NO.:  [id number]   MEDICAL RECORD NO.:  [id number]          PATIENT TYPE:  OIB   LOCATION:  2903                         FACILITY:  [facility]   PHYSICIAN:  Luis Abed, MD, FACCDATE OF BIRTH:  August 01, 1955   DATE OF ADMISSION:  07/21/2007  DATE OF DISCHARGE:  07/23/2007                         DISCHARGE SUMMARY - REFERRING   DISCHARGE DIAGNOSES:  1. Acute inferior posterior ST elevated myocardial infarction.  2. Hyperlipidemia.  3. Hypotension and junctional bradycardia post-procedure.  4. Hyperglycemia possibly associated with myocardial infarction.   PROCEDURES PERFORMED:  Emergent cardiac catheterization with drug-  eluting stent to the mid RCA by [name] on July 21, 2007.   HISTORY:  [name] is a 55 year old Asian male who presents with an  acute inferior posterior ST-segment elevation myocardial infarction.  On  the day prior to admission, he developed substernal chest pressure that  lasted about an hour and resolved spontaneously.  However, he had  reoccurring episodes on the afternoon of admission.  These were  associated with shortness of breath and diaphoresis.  After notifying  his employer, EMS was summoned.  EMS noted ST elevation.  He was taken  directly to the catheterization lab.   ALLERGIES:  1. PENICILLIN.  2. STREPTOMYCIN.   LABORATORY:  Admission H&H was 14.0 and 41.3, normal indices, platelets  213, WBCs 8.4.  On July 22, 2007, H&H was 13.0 and 38.3, normal indices,  platelets 195, WBCs 13.8, PTT 113, PT 14.8, sodium 137, potassium 3.0,  BUN 13, creatinine 1.0, normal LFTs, glucose 185.  On the date of  discharge on July 22, 2007, potassium 3.7, BUN 9, creatinine 1.04.  Initial total CK-MB was 125 and 1.6, and troponin is 0.01.  However,  second CK-MB peaked at 1869 and 187.3 with a relative index of 10.  Subsequent enzymes were declining.  Second troponin was 22.23, third  troponin was 47.22.  Subsequent  troponins were declining.  Fasting  lipids showed total cholesterol 220, triglycerides 93, HDL 34, LDL 167,  TSH 1.69.  Urinalysis was unremarkable.   DIAGNOSTICS:  1. Chest x-ray on July 21, 2007 showed pulmonary hyperaeration      suggestive of COPD, emphysema, no acute findings.  2. EKGs on admission showed ST elevation inferior posterior.      Subsequent EKG showed evolving EKG.   HOSPITAL COURSE:  The patient was taken emergently to the  catheterization laboratory by Dr. Charlies Constable.  Catheterization  revealed a 99% mid RCA which was reduced to 0% with a drug-eluting  stent.  He does have residual disease, the 70-80% proximal circ and  70/70% mid LAD.  EF was approximately 60% with mid inferior hypokinesis.  [name] felt that he should have medical treatment of the residual  coronary artery disease.  Postprocedure, the patient did have some chest  discomfort, nausea and hypotension.  He described it as indigestion.  Post-nitroglycerin, blood pressure dropped to the 70s and the patient  became bradycardic with a junctional escape rhythm.  Post-receiving  normal saline bolus, his blood pressure and heart rate improved.  Beta  blocker and ACE inhibitor was held initially secondary to hypotension  and felt that it should be restarted if blood pressure could tolerate  it.  Cardiac rehab assisted with education and ambulation.  By July 23, 2007, he was ambulating the hall, catheterization site was intact and it  was felt that he could be discharged home.  Blood pressure still  remained in the 90-100s, but given his recent myocardial infarction, a  low-dose beta blocker was resumed.   DISPOSITION:  The patient is discharged home and asked to maintain a low-  sodium heart-healthy diet.  Wound care as per supplemental sheet post-  catheterization.  He was advised no lifting, driving, sexual activity  for 2 weeks.   NEW MEDICATIONS:  1. Aspirin 325 mg daily.  2. Plavix 75 mg  daily.  3. Nitroglycerin 0.4 as needed.  4. Lopressor 25 mg half a tablet b.i.d.  5. Crestor 40 mg q.h.s.  6. He was given permission to continue Nexium 40 mg daily.   DISCHARGE INSTRUCTIONS:  1. He will need blood work in 6-8 weeks to follow up on FLP and LFTs      since Crestor was initiated.  2. He is asked to bring all medications to all appointments.  3. The office will call with a follow up appointment with [name]      in approximately 2 weeks.   Discharge time 35 minutes.      Joellyn Rued, PA-C      Luis Abed, MD, [facility]  Electronically Signed    EW/MEDQ  D:  07/23/2007  T:  07/23/2007  Job:  [id number]   cc:   Derrell Lolling, MD  Everardo Beals Juanda Chance, MD, [facility]

## 2010-05-27 NOTE — Discharge Summary (Signed)
NAMEHUMBERTO, [name]               ACCOUNT NO.:  [id number]   MEDICAL RECORD NO.:  [id number]          PATIENT TYPE:  OBV   LOCATION:  6529                         FACILITY:  [facility]   PHYSICIAN:  Everardo Beals. Juanda Chance, MD, FACCDATE OF BIRTH:  [birth year]   DATE OF ADMISSION:  12/21/2007  DATE OF DISCHARGE:  12/23/2007                               DISCHARGE SUMMARY   DISCHARGE DIAGNOSES:  1. Coronary artery disease status post cardiac catheterization this      admission with percutaneous coronary intervention.  The patient      underwent successful percutaneous coronary intervention of the      proximal circumflex artery with placement of a drug-eluting stent,      successful percutaneous intervention in the left anterior      descending with placement of a drug-eluting stent.  Recommendations      for aspirin indefinitely of Plavix for 1 year.  2. Past history of coronary artery disease status post inferior-      posterior ST-elevated MI in July 2009 with drug-eluting stent to      the right coronary artery, residual coronary artery disease with      left anterior descending 70%, circumflex 78% with an ejection      fraction of 60%.  3. Hyperlipidemia.  4. Gastroesophageal reflux disease/hiatal hernia.  5. Participant in the TRACER study at this admission.   HOSPITAL COURSE:  Mr. Lauderback is a patient of Dr. Charlies Constable and Dr.  Derrell Lolling and Cheryl Flash, [place].  Mr. Carmean was at cardiac rehab on day of  admission doing the exercise capacity test.  He developed substernal  left-sided chest pain, treated with sublingual nitroglycerin.  It was  felt the patient needed further workup.  He was admitted, started on  heparin, nitroglycerin, and continued his home medications.  The patient  went to the cath lab on December 22, 2007, results were as stated above.  The patient then underwent percutaneous intervention.  He had the XIENCE  drug-eluting stent to the proximal circumflex artery and a Taxus  drug-  eluting stent to the mid LAD.  Mr. Mandler tolerated the procedure  without complications.  [name] went to see the patient on day of  discharge.  Vital signs were stable.  The patient was being discharged  home to follow up with [name] at  outpatient.  At the time of discharge, potassium 3.7, creatinine 0.9,  hematocrit 42.1, and platelets 175,000.  Cardiac enzymes negative.   DURATION OF DISCHARGE ENCOUNTER:  Less than 30 minutes.      Dorian Pod, ACNP      Bruce R. Juanda Chance, MD, [facility]  Electronically Signed    MB/MEDQ  D:  12/23/2007  T:  12/23/2007  Job:  [id number]   cc:   Romie Jumper, PA  Synetta Fail, M.D.

## 2010-06-13 ENCOUNTER — Telehealth: Payer: Self-pay | Admitting: Cardiovascular Disease

## 2010-06-13 MED ORDER — NITROGLYCERIN 0.4 MG SL SUBL: 0.4000 mg | SUBLINGUAL_TABLET | SUBLINGUAL | Status: DC | PRN

## 2010-06-13 NOTE — Telephone Encounter (Signed)
Pt needs nitro rx# [id number] to be call [facility] to [facility] # [phone] options #3

## 2010-06-23 ENCOUNTER — Telehealth: Payer: Self-pay | Admitting: Cardiovascular Disease

## 2010-06-23 MED ORDER — CLOPIDOGREL BISULFATE 75 MG PO TABS: 75.0000 mg | ORAL_TABLET | Freq: Every day | ORAL | Status: DC

## 2010-06-23 NOTE — Telephone Encounter (Signed)
plavix uses [facility] [address]

## 2010-09-24 ENCOUNTER — Telehealth: Payer: Self-pay | Admitting: Cardiovascular Disease

## 2010-09-24 MED ORDER — METOPROLOL SUCCINATE ER 25 MG PO TB24: 25.0000 mg | ORAL_TABLET | Freq: Every day | ORAL | Status: DC

## 2010-09-24 NOTE — Telephone Encounter (Signed)
[facility], requesting refill metoprolol 25 mg 30 days, 6 refills, faxed yesterday

## 2010-10-09 LAB — COMPREHENSIVE METABOLIC PANEL
Albumin: 3.7
Alkaline Phosphatase: 47
BUN: 9
CO2: 21
CO2: 27
Calcium: 9
Chloride: 107
Creatinine, Ser: 1
GFR calc Af Amer: >60
GFR calc non Af Amer: >60
Glucose, Bld: 185 — ABNORMAL HIGH
Potassium: 3.7
Total Bilirubin: 0.9
Total Protein: 6.6

## 2010-10-09 LAB — CARDIAC PANEL(CRET KIN+CKTOT+MB+TROPI)
CK, MB: 1.6
CK, MB: 85.1 — ABNORMAL HIGH
Relative Index: 6.9 — ABNORMAL HIGH
Relative Index: 7.6 — ABNORMAL HIGH
Total CK: 125
Troponin I: 22.23
Troponin I: 25.32
Troponin I: 47.22

## 2010-10-09 LAB — PROTIME-INR
INR: 1.1
Prothrombin Time: 14.8

## 2010-10-09 LAB — URINALYSIS, ROUTINE W REFLEX MICROSCOPIC
Bilirubin Urine: NEGATIVE
Ketones, ur: NEGATIVE
Nitrite: NEGATIVE
Specific Gravity, Urine: 1.009
Urobilinogen, UA: 0.2

## 2010-10-09 LAB — CBC
HCT: 38.3 — ABNORMAL LOW
Hemoglobin: 13
Hemoglobin: 14
MCHC: 33.9
MCV: 89.2
Platelets: 195
RBC: 4.26
RBC: 4.63
RDW: 13.3
WBC: 13.8 — ABNORMAL HIGH

## 2010-10-09 LAB — TSH: TSH: 1.679

## 2010-10-09 LAB — DIFFERENTIAL
Lymphocytes Relative: 22
Lymphs Abs: 1.8
Monocytes Relative: 6
Neutrophils Relative %: 72

## 2010-10-09 LAB — LIPID PANEL
LDL Cholesterol: 167 — ABNORMAL HIGH
VLDL: 19

## 2010-10-09 LAB — APTT: aPTT: 113 — ABNORMAL HIGH

## 2010-10-17 LAB — BASIC METABOLIC PANEL
BUN: 11 mg/dL (ref 6–23)
CO2: 23 mEq/L (ref 19–32)
Calcium: 9.3 mg/dL (ref 8.4–10.5)
Chloride: 108 mEq/L (ref 96–112)
Creatinine, Ser: 1.01 mg/dL (ref 0.4–1.5)
GFR calc Af Amer: >60 mL/min (ref >60)
GFR calc non Af Amer: >60 mL/min (ref >60)
Glucose, Bld: 98 mg/dL (ref 70–99)
Potassium: 3.7 mEq/L (ref 3.5–5.1)
Sodium: 141 mEq/L (ref 135–145)

## 2010-10-17 LAB — DIFFERENTIAL
Basophils Relative: 1 % (ref 0–1)
Monocytes Absolute: 0.6 10*3/uL (ref 0.1–1.0)
Monocytes Relative: 11 % (ref 3–12)
Neutro Abs: 3.8 10*3/uL (ref 1.7–7.7)

## 2010-10-17 LAB — CBC
HCT: 42.1 % (ref 39.0–52.0)
MCHC: 32.7 g/dL (ref 30.0–36.0)
MCV: 91 fL (ref 78.0–100.0)
MCV: 91.2 fL (ref 78.0–100.0)
Platelets: 180 10*3/uL (ref 150–400)
RBC: 4.62 MIL/uL (ref 4.22–5.81)
RBC: 4.78 MIL/uL (ref 4.22–5.81)
WBC: 5.7 10*3/uL (ref 4.0–10.5)
WBC: 6.4 10*3/uL (ref 4.0–10.5)

## 2010-10-17 LAB — HEPATIC FUNCTION PANEL
ALT: 56 U/L — ABNORMAL HIGH (ref 0–53)
AST: 38 U/L — ABNORMAL HIGH (ref 0–37)
Alkaline Phosphatase: 53 U/L (ref 39–117)
Bilirubin, Direct: 0.1 mg/dL (ref 0.0–0.3)
Total Bilirubin: 0.7 mg/dL (ref 0.3–1.2)

## 2010-10-17 LAB — CK TOTAL AND CKMB (NOT AT ARMC)
CK, MB: 1.7 ng/mL (ref 0.3–4.0)
Relative Index: 1.1 (ref 0.0–2.5)
Total CK: 154 U/L (ref 7–232)

## 2010-10-17 LAB — TROPONIN I: Troponin I: 0.01 ng/mL (ref 0.00–0.06)

## 2010-10-17 LAB — HEPARIN LEVEL (UNFRACTIONATED): Heparin Unfractionated: 0.82 IU/mL — ABNORMAL HIGH (ref 0.30–0.70)

## 2010-10-17 LAB — PROTIME-INR: Prothrombin Time: 13.5 seconds (ref 11.6–15.2)

## 2010-11-13 ENCOUNTER — Telehealth: Payer: Self-pay | Admitting: Cardiovascular Disease

## 2010-11-13 NOTE — Telephone Encounter (Signed)
New message  Pt wants refill of famotidine Please call to [facility] [address]

## 2010-11-19 ENCOUNTER — Other Ambulatory Visit: Payer: Self-pay

## 2010-11-19 MED ORDER — FAMOTIDINE 40 MG PO TABS: 40.0000 mg | ORAL_TABLET | Freq: Every day | ORAL | Status: DC

## 2010-12-09 ENCOUNTER — Ambulatory Visit (INDEPENDENT_AMBULATORY_CARE_PROVIDER_SITE_OTHER): Payer: BC Managed Care – PPO | Admitting: Cardiovascular Disease

## 2010-12-09 ENCOUNTER — Encounter: Payer: Self-pay | Admitting: Cardiovascular Disease

## 2010-12-09 ENCOUNTER — Ambulatory Visit: Payer: BC Managed Care – PPO | Admitting: Cardiovascular Disease

## 2010-12-09 VITALS — BP 133/91 | HR 85 | Ht 73.0 in | Wt 157.0 lb

## 2010-12-09 DIAGNOSIS — I2510 Atherosclerotic heart disease of native coronary artery without angina pectoris: Secondary | ICD-10-CM

## 2010-12-09 NOTE — Progress Notes (Signed)
History of Present Illness:55 yo male with history of CAD, OSA, hyperlipidemia here today for cardiac follow up. He has been followed in the past by [name]. I saw him for the first time in April 2012. He had an inferior MI in 2009 treated with a drug-eluting stent to the right coronary artery. In December 2009 he had a non-ST elevation MI treated with drug-eluting stents to the circumflex and LAD. He had a followup catheterization in February 2010 which showed nonobstructive disease.  He has been doing well overall. No heavy chest pain. Occasional dull chest pains. These lasts for a few seconds. He also describes fatigue at times but this is only occasional. His anginal equivalent prior to stenting procedures was jaw pain and left chest pain. He has had none of this over the last two years.   Primary care is Dr. Ninfa Meeker, [place], [place]).     Past Medical History  Diagnosis Date  . Coronary artery disease   . Hyperlipidemia   . GERD (gastroesophageal reflux disease)   . Back pain     Past Surgical History  Procedure Date  . Tonsillectomy     Current Outpatient Prescriptions  Medication Sig Dispense Refill  . aspirin 81 MG tablet Take 81 mg by mouth daily.        . clopidogrel (PLAVIX) 75 MG tablet Take 1 tablet (75 mg total) by mouth daily.  30 tablet  12  . famotidine (PEPCID) 40 MG tablet Take 1 tablet (40 mg total) by mouth daily.  30 tablet  3  . metoprolol (TOPROL-XL) 50 MG 24 hr tablet Take 1/2 tab daily       . nitroGLYCERIN (NITROSTAT) 0.4 MG SL tablet Place 1 tablet (0.4 mg total) under the tongue every 5 (five) minutes as needed.  25 tablet  12  . rosuvastatin (CRESTOR) 20 MG tablet Take 20 mg by mouth daily. 1/2 tab daily         Allergies  Allergen Reactions  . Amoxicillin     REACTION: unspecified  . Penicillins     History   Social History  . Marital Status: Married    Spouse Name: N/A    Number of Children: N/A  . Years of Education: N/A    Occupational History  . Not on file.   Social History Main Topics  . Smoking status: Never Smoker   . Smokeless tobacco: Not on file  . Alcohol Use: Not on file  . Drug Use: Not on file  . Sexually Active: Not on file   Other Topics Concern  . Not on file   Social History Narrative  . No narrative on file    Family History  Problem Relation Age of Onset  . Coronary artery disease Mother   . Hypertension Father     Review of Systems:  As stated in the HPI and otherwise negative.   BP 133/91  Pulse 85  Ht 6\' 1"  (1.854 m)  Wt 157 lb (71.215 kg)  BMI 20.71 kg/m2  Physical Examination: General: Well developed, well nourished, NAD HEENT: OP clear, mucus membranes moist SKIN: warm, dry. No rashes. Neuro: No focal deficits Musculoskeletal: Muscle strength 5/5 all ext Psychiatric: Mood and affect normal Neck: No JVD, no carotid bruits, no thyromegaly, no lymphadenopathy. Lungs:Clear bilaterally, no wheezes, rhonci, crackles Cardiovascular: Regular rate and rhythm. No murmurs, gallops or rubs. Abdomen:Soft. Bowel sounds present. Non-tender.  Extremities: No lower extremity edema. Pulses are 2 + in the bilateral  DP/PT.

## 2010-12-09 NOTE — Assessment & Plan Note (Addendum)
Stable. No changes. Will check lipids and LFTs at next visit. BP has been well controlled at home.

## 2010-12-09 NOTE — Patient Instructions (Signed)
Your physician wants you to follow-up in: 6 months. You will receive a reminder letter in the mail two months in advance. If you don't receive a letter, please call our office to schedule the follow-up appointment.  Your physician recommends that you return for fasting lab work in 6 months on day of appt with Dr. Clifton James

## 2011-05-06 ENCOUNTER — Ambulatory Visit (INDEPENDENT_AMBULATORY_CARE_PROVIDER_SITE_OTHER): Payer: BC Managed Care – PPO | Admitting: *Deleted

## 2011-05-06 DIAGNOSIS — E785 Hyperlipidemia, unspecified: Secondary | ICD-10-CM

## 2011-05-06 LAB — HEPATIC FUNCTION PANEL
Bilirubin, Direct: 0.1 mg/dL (ref 0.0–0.3)
Total Bilirubin: 0.7 mg/dL (ref 0.3–1.2)
Total Protein: 7.3 g/dL (ref 6.0–8.3)

## 2011-05-06 LAB — LIPID PANEL
Cholesterol: 148 mg/dL (ref 0–200)
HDL: 55.5 mg/dL (ref >39.00)
LDL Cholesterol: 76 mg/dL (ref 0–99)
VLDL: 16.6 mg/dL (ref 0.0–40.0)

## 2011-05-11 ENCOUNTER — Encounter: Payer: Self-pay | Admitting: Cardiovascular Disease

## 2011-05-11 ENCOUNTER — Ambulatory Visit (INDEPENDENT_AMBULATORY_CARE_PROVIDER_SITE_OTHER): Payer: BC Managed Care – PPO | Admitting: Cardiovascular Disease

## 2011-05-11 VITALS — BP 122/83 | HR 73 | Ht 72.0 in | Wt 154.0 lb

## 2011-05-11 DIAGNOSIS — I2510 Atherosclerotic heart disease of native coronary artery without angina pectoris: Secondary | ICD-10-CM

## 2011-05-11 NOTE — Progress Notes (Signed)
History of Present Illness: 56 yo male with history of CAD, OSA, hyperlipidemia here today for cardiac follow up. He has been followed in the past by [name]. I saw him for the first time in April 2012. He had an inferior MI in 2009 treated with a drug-eluting stent to the right coronary artery. In December 2009 he had a non-ST elevation MI treated with drug-eluting stents to the circumflex and LAD. He had a follow-up catheterization in February 2010 which showed nonobstructive disease. I last saw him in November 2012.   He has been doing well overall. No heavy chest pain. Occasional dull chest pains. These lasts for a few seconds. 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 His anginal equivalent prior to stenting procedures was jaw pain and left chest pain. He has had none of this over the last three years.   Primary Care Physician: Dr. Ninfa Meeker, [place], [place]).   Last Lipid Profile:  Lipid Panel     Component Value Date/Time   CHOL 148 05/06/2011 0832   TRIG 83.0 05/06/2011 0832   HDL 55.50 05/06/2011 0832   CHOLHDL 3 05/06/2011 0832   VLDL 16.6 05/06/2011 0832   LDLCALC 76 05/06/2011 0832     Past Medical History  Diagnosis Date  . Coronary artery disease   . Hyperlipidemia   . GERD (gastroesophageal reflux disease)   . Back pain     Past Surgical History  Procedure Date  . Tonsillectomy     Current Outpatient Prescriptions  Medication Sig Dispense Refill  . aspirin 81 MG tablet Take 81 mg by mouth daily.        . clopidogrel (PLAVIX) 75 MG tablet Take 1 tablet (75 mg total) by mouth daily.  30 tablet  12  . famotidine (PEPCID) 40 MG tablet Take 1 tablet (40 mg total) by mouth daily.  30 tablet  3  . gabapentin (NEURONTIN) 100 MG capsule 1 tab as needed      . metoprolol (TOPROL-XL) 50 MG 24 hr tablet Take 1/2 tab daily       . nitroGLYCERIN (NITROSTAT) 0.4 MG SL tablet Place 1 tablet (0.4 mg total) under the tongue every 5 (five) minutes as needed.  25 tablet   12  . rosuvastatin (CRESTOR) 20 MG tablet Take 10 mg by mouth daily.         Allergies  Allergen Reactions  . Amoxicillin     REACTION: unspecified  . Penicillins     History   Social History  . Marital Status: Married    Spouse Name: N/A    Number of Children: N/A  . Years of Education: N/A   Occupational History  . Not on file.   Social History Main Topics  . Smoking status: Never Smoker   . Smokeless tobacco: Not on file  . Alcohol Use: Not on file  . Drug Use: Not on file  . Sexually Active: Not on file   Other Topics Concern  . Not on file   Social History Narrative  . No narrative on file    Family History  Problem Relation Age of Onset  . Coronary artery disease Mother   . Hypertension Father     Review of Systems:  As stated in the HPI and otherwise negative.   BP 122/83  Pulse 73  Ht 6' (1.829 m)  Wt 154 lb (69.854 kg)  BMI 20.89 kg/m2  Physical Examination: General: Well developed, well nourished, NAD  HEENT: OP clear, mucus membranes moist SKIN: warm, dry. No rashes. Neuro: No focal deficits Musculoskeletal: Muscle strength 5/5 all ext Psychiatric: Mood and affect normal Neck: No JVD, no carotid bruits, no thyromegaly, no lymphadenopathy. Lungs:Clear bilaterally, no wheezes, rhonci, crackles Cardiovascular: Regular rate and rhythm. No murmurs, gallops or rubs. Abdomen:Soft. Bowel sounds present. Non-tender.  Extremities: No lower extremity edema. Pulses are 2 + in the bilateral DP/PT.  EKG: NSR, rate 73 bpm.

## 2011-05-11 NOTE — Assessment & Plan Note (Addendum)
Stable. No changes today. BP well controlled. Lipids well controlled. OK to travel to [place].

## 2011-05-11 NOTE — Patient Instructions (Signed)
Your physician wants you to follow-up in:  6 months. You will receive a reminder letter in the mail two months in advance. If you don't receive a letter, please call our office to schedule the follow-up appointment.   

## 2011-05-26 ENCOUNTER — Telehealth: Payer: Self-pay | Admitting: Cardiovascular Disease

## 2011-05-26 MED ORDER — FAMOTIDINE 40 MG PO TABS: 40.0000 mg | ORAL_TABLET | Freq: Every day | ORAL | Status: DC

## 2011-05-26 MED ORDER — NITROGLYCERIN 0.4 MG SL SUBL: 0.4000 mg | SUBLINGUAL_TABLET | SUBLINGUAL | Status: DC | PRN

## 2011-05-26 NOTE — Telephone Encounter (Signed)
Spoke with pt who reports he will be traveling to [place]. I explained to him that Toprol lasts for 24 hours and he should not take increased dose.  I told him to try to space it out every 24 hours while traveling.  Last office visit note indicates he may travel to [place] and I gave him this information.  He will try and take 2 shorter flights instead of one longer flight.  Pt made aware he should get up and move around plane frequently.

## 2011-05-26 NOTE — Telephone Encounter (Signed)
Pt calling re metoprolol , going overseas and wants to know if he can take the whole pill 50 mg to last 48 hrs  Instead of 1/2 pill to last 24 hrs while on the plane? pls call [phone] also , how long can he stay in the air without it effecting his heart? 1st flight 8-9 hrs then connecting flight about 13 hrs

## 2011-06-26 ENCOUNTER — Other Ambulatory Visit (HOSPITAL_COMMUNITY): Payer: Self-pay

## 2011-06-26 MED ORDER — CLOPIDOGREL BISULFATE 75 MG PO TABS: 75.0000 mg | ORAL_TABLET | Freq: Every day | ORAL | Status: DC

## 2011-06-26 NOTE — Telephone Encounter (Signed)
..   Requested Prescriptions   Signed Prescriptions Disp Refills  . clopidogrel (PLAVIX) 75 MG tablet 30 tablet 6    Sig: Take 1 tablet (75 mg total) by mouth daily.    Authorizing Provider: Verne Carrow D    Ordering User: Christella Hartigan, Talar Fraley Judie Petit

## 2011-10-14 ENCOUNTER — Ambulatory Visit: Payer: BC Managed Care – PPO | Admitting: Physical Therapy

## 2011-10-15 ENCOUNTER — Ambulatory Visit
Payer: BC Managed Care – PPO | Attending: Internal Medicine | Admitting: Rehabilitative and Restorative Service Providers"

## 2011-10-15 DIAGNOSIS — IMO0001 Reserved for inherently not codable concepts without codable children: Secondary | ICD-10-CM | POA: Insufficient documentation

## 2011-10-15 DIAGNOSIS — M25539 Pain in unspecified wrist: Secondary | ICD-10-CM | POA: Insufficient documentation

## 2011-10-21 ENCOUNTER — Telehealth: Payer: Self-pay | Admitting: Cardiovascular Disease

## 2011-10-21 ENCOUNTER — Other Ambulatory Visit: Payer: Self-pay | Admitting: Cardiovascular Disease

## 2011-10-21 DIAGNOSIS — I2510 Atherosclerotic heart disease of native coronary artery without angina pectoris: Secondary | ICD-10-CM

## 2011-10-21 MED ORDER — METOPROLOL SUCCINATE ER 50 MG PO TB24: 25.0000 mg | ORAL_TABLET | Freq: Every day | ORAL | Status: DC

## 2011-10-21 NOTE — Telephone Encounter (Signed)
Refill- Metoprolol,  Verified preferred [facility] [place], [place]  Notes state refill was sent out today, but pharmacy told pt RX was not received.  Pt only has 1 pill left.

## 2011-10-22 ENCOUNTER — Other Ambulatory Visit: Payer: Self-pay

## 2011-10-22 MED ORDER — METOPROLOL SUCCINATE ER 50 MG PO TB24: ORAL_TABLET | ORAL | Status: DC

## 2011-10-26 MED ORDER — METOPROLOL SUCCINATE ER 25 MG PO TB24: 12.5000 mg | ORAL_TABLET | Freq: Every day | ORAL | Status: DC

## 2011-10-26 NOTE — Telephone Encounter (Signed)
F/u  Need clarification on metoprolol 25 mg or 50 mg. Patient states he has been taken 25 mg.   Please submit a refill request for metoprolol.

## 2011-10-26 NOTE — Telephone Encounter (Signed)
Spoke with pt who clarified he is taking 12.5 mg Toprol daily and has been on this dose for a long time.  Will send this refill to [facility]

## 2011-10-26 NOTE — Telephone Encounter (Signed)
No answer on home number listed for call back.  Left message on cell phone to call back.  I called pharmacy and they stated they have been filling prescription for Toprol XL 25 mg half tablet daily (12.5mg  total). Last 2 office notes indicate pt is taking Toprol 50 mg half tablet daily (25 mg total).  I called pt to clarify.

## 2011-10-28 ENCOUNTER — Encounter: Payer: BC Managed Care – PPO | Admitting: Physical Therapy

## 2011-11-10 ENCOUNTER — Ambulatory Visit (INDEPENDENT_AMBULATORY_CARE_PROVIDER_SITE_OTHER): Payer: BC Managed Care – PPO | Admitting: Cardiovascular Disease

## 2011-11-10 ENCOUNTER — Encounter: Payer: Self-pay | Admitting: Cardiovascular Disease

## 2011-11-10 VITALS — BP 122/82 | HR 89 | Ht 72.0 in | Wt 159.0 lb

## 2011-11-10 DIAGNOSIS — I2510 Atherosclerotic heart disease of native coronary artery without angina pectoris: Secondary | ICD-10-CM

## 2011-11-10 NOTE — Progress Notes (Signed)
History of Present Illness: 56 yo male with history of CAD, OSA, hyperlipidemia here today for cardiac follow up. He has been followed in the past by [name]. I saw him for the first time in April 2012. He had an inferior MI in 2009 treated with a drug-eluting stent to the right coronary artery. In December 2009 he had a non-ST elevation MI treated with drug-eluting stents to the circumflex and LAD. He had a follow-up catheterization in February 2010 which showed nonobstructive disease. I last saw him in April 2013.   He has been doing well overall. No heavy chest pain. Occasional dull chest pains.  His anginal equivalent prior to stenting procedures was jaw pain and left chest pain. He has had none of this over the last four years. He has been walking some over the last few months but not exercising every day.   Primary Care Physician: Dr. Ninfa Meeker, [place], [place]).   Last Lipid Profile:Lipid Panel     Component Value Date/Time   CHOL 148 05/06/2011 0832   TRIG 83.0 05/06/2011 0832   HDL 55.50 05/06/2011 0832   CHOLHDL 3 05/06/2011 0832   VLDL 16.6 05/06/2011 0832   LDLCALC 76 05/06/2011 0832    Past Medical History  Diagnosis Date  . Coronary artery disease   . Hyperlipidemia   . GERD (gastroesophageal reflux disease)   . Back pain     Past Surgical History  Procedure Date  . Tonsillectomy     Current Outpatient Prescriptions  Medication Sig Dispense Refill  . aspirin 81 MG tablet Take 81 mg by mouth daily.        . clopidogrel (PLAVIX) 75 MG tablet Take 1 tablet (75 mg total) by mouth daily.  30 tablet  6  . famotidine (PEPCID) 40 MG tablet Take 1 tablet (40 mg total) by mouth daily.  30 tablet  6  . gabapentin (NEURONTIN) 100 MG capsule 1 tab as needed      . metoprolol succinate (TOPROL XL) 25 MG 24 hr tablet Take 0.5 tablets (12.5 mg total) by mouth daily.  15 tablet  11  . nitroGLYCERIN (NITROSTAT) 0.4 MG SL tablet Place 1 tablet (0.4 mg total) under the tongue every 5  (five) minutes as needed.  25 tablet  6  . rosuvastatin (CRESTOR) 20 MG tablet Take 10 mg by mouth daily.         Allergies  Allergen Reactions  . Amoxicillin     REACTION: unspecified  . Penicillins     History   Social History  . Marital Status: Married    Spouse Name: N/A    Number of Children: N/A  . Years of Education: N/A   Occupational History  . Not on file.   Social History Main Topics  . Smoking status: Never Smoker   . Smokeless tobacco: Not on file  . Alcohol Use: Not on file  . Drug Use: Not on file  . Sexually Active: Not on file   Other Topics Concern  . Not on file   Social History Narrative  . No narrative on file    Family History  Problem Relation Age of Onset  . Coronary artery disease Mother   . Hypertension Father     Review of Systems:  As stated in the HPI and otherwise negative.   BP 122/82  Pulse 89  Ht 6' (1.829 m)  Wt 159 lb (72.122 kg)  BMI 21.56 kg/m2  Physical Examination: General: Well  developed, well nourished, NAD HEENT: OP clear, mucus membranes moist SKIN: warm, dry. No rashes. Neuro: No focal deficits Musculoskeletal: Muscle strength 5/5 all ext Psychiatric: Mood and affect normal Neck: No JVD, no carotid bruits, no thyromegaly, no lymphadenopathy. Lungs:Clear bilaterally, no wheezes, rhonci, crackles Cardiovascular: Regular rate and rhythm. No murmurs, gallops or rubs. Abdomen:Soft. Bowel sounds present. Non-tender.  Extremities: No lower extremity edema. Pulses are 2 + in the bilateral DP/PT.  EKG: NSR, rate 86 bpm. Normal EKG.   Assessment and Plan:   1. CAD: Stable. He is on good medical therapy. No changes today. BP well controlled. Lipids well controlled. Will repeat echo to assess LV function.

## 2011-11-10 NOTE — Patient Instructions (Addendum)

## 2011-11-20 ENCOUNTER — Ambulatory Visit (HOSPITAL_COMMUNITY): Payer: BC Managed Care – PPO | Attending: Cardiovascular Disease

## 2011-11-20 DIAGNOSIS — I379 Nonrheumatic pulmonary valve disorder, unspecified: Secondary | ICD-10-CM | POA: Insufficient documentation

## 2011-11-20 DIAGNOSIS — I2510 Atherosclerotic heart disease of native coronary artery without angina pectoris: Secondary | ICD-10-CM | POA: Insufficient documentation

## 2011-11-20 DIAGNOSIS — I369 Nonrheumatic tricuspid valve disorder, unspecified: Secondary | ICD-10-CM | POA: Insufficient documentation

## 2011-11-20 DIAGNOSIS — I059 Rheumatic mitral valve disease, unspecified: Secondary | ICD-10-CM | POA: Insufficient documentation

## 2011-11-20 NOTE — Progress Notes (Signed)
Echocardiogram performed.  

## 2011-11-25 ENCOUNTER — Telehealth: Payer: Self-pay | Admitting: Cardiovascular Disease

## 2011-11-25 NOTE — Telephone Encounter (Signed)
Spoke with pt and reviewed echo results with him.  Will mail copy to him per his request.

## 2011-11-25 NOTE — Telephone Encounter (Signed)
New Problem:    Patient called in wanting to know the results of his ECHO.  Please call back.

## 2012-01-18 ENCOUNTER — Other Ambulatory Visit: Payer: Self-pay | Admitting: *Deleted

## 2012-01-18 MED ORDER — CLOPIDOGREL BISULFATE 75 MG PO TABS: 75.0000 mg | ORAL_TABLET | Freq: Every day | ORAL | Status: DC

## 2012-02-15 ENCOUNTER — Telehealth: Payer: Self-pay | Admitting: Cardiovascular Disease

## 2012-02-15 MED ORDER — ROSUVASTATIN CALCIUM 10 MG PO TABS: 10.0000 mg | ORAL_TABLET | Freq: Every day | ORAL | Status: DC

## 2012-02-15 NOTE — Telephone Encounter (Signed)
New Problem    Refill Request Crestor 10 mg  To [facility]

## 2012-02-15 NOTE — Telephone Encounter (Signed)
Rx sent to [facility] on [address].  Pt was notified.

## 2012-02-15 NOTE — Telephone Encounter (Signed)
This is Dr McAlhany's patient. 

## 2012-02-15 NOTE — Telephone Encounter (Signed)
New Problem      Pt would like to speak to nurse regarding Crestor prescription.

## 2012-05-11 ENCOUNTER — Encounter: Payer: Self-pay | Admitting: Cardiovascular Disease

## 2012-05-11 ENCOUNTER — Ambulatory Visit (INDEPENDENT_AMBULATORY_CARE_PROVIDER_SITE_OTHER): Payer: BC Managed Care – PPO | Admitting: Cardiovascular Disease

## 2012-05-11 VITALS — BP 130/86 | HR 84 | Ht 72.0 in | Wt 155.0 lb

## 2012-05-11 DIAGNOSIS — I2510 Atherosclerotic heart disease of native coronary artery without angina pectoris: Secondary | ICD-10-CM

## 2012-05-11 DIAGNOSIS — E785 Hyperlipidemia, unspecified: Secondary | ICD-10-CM

## 2012-05-11 NOTE — Patient Instructions (Addendum)
Your physician wants you to follow-up in:  12 months.  You will receive a reminder letter in the mail two months in advance. If you don't receive a letter, please call our office to schedule the follow-up appointment.  Your physician has requested that you have an exercise tolerance test. To be done in June with Dr. Clifton James. For further information please visit [email or web address] Please also follow instruction sheet, as given.

## 2012-05-11 NOTE — Progress Notes (Signed)
History of Present Illness: 57 yo male with history of CAD, OSA, hyperlipidemia here today for cardiac follow up. He has been followed in the past by [name]. I saw him for the first time in April 2012. He had an inferior MI in 2009 treated with a drug-eluting stent to the right coronary artery. In December 2009 he had a non-ST elevation MI treated with drug-eluting stents to the circumflex and LAD. He had a follow-up catheterization in February 2010 which showed nonobstructive disease. I last saw him in October 2013.   He has been doing well overall. No heavy chest pain. Occasional dull chest pains. His anginal equivalent prior to stenting procedures was jaw pain and left chest pain. He has stopped drinking caffeine.   Primary Care Physician: Dr. Derrell Lolling, [place], [place]).   Last Lipid Profile:Lipid Panel     Component Value Date/Time   CHOL 148 05/06/2011 0832   TRIG 83.0 05/06/2011 0832   HDL 55.50 05/06/2011 0832   CHOLHDL 3 05/06/2011 0832   VLDL 16.6 05/06/2011 0832   LDLCALC 76 05/06/2011 0832     Past Medical History  Diagnosis Date  . Coronary artery disease   . Hyperlipidemia   . GERD (gastroesophageal reflux disease)   . Back pain     Past Surgical History  Procedure Laterality Date  . Tonsillectomy      Current Outpatient Prescriptions  Medication Sig Dispense Refill  . aspirin 81 MG tablet Take 81 mg by mouth daily.        . Cholecalciferol (VITAMIN D-3 PO) Take by mouth. 2000 iu  1 tab daily      . clopidogrel (PLAVIX) 75 MG tablet Take 1 tablet (75 mg total) by mouth daily.  30 tablet  6  . fish oil-omega-3 fatty acids 1000 MG capsule Take 2 g by mouth as needed.      . gabapentin (NEURONTIN) 100 MG capsule 1 tab as needed      . metoprolol succinate (TOPROL XL) 25 MG 24 hr tablet Take 0.5 tablets (12.5 mg total) by mouth daily.  15 tablet  11  . nitroGLYCERIN (NITROSTAT) 0.4 MG SL tablet Place 1 tablet (0.4 mg total) under the tongue every 5 (five) minutes as  needed.  25 tablet  6  . rosuvastatin (CRESTOR) 10 MG tablet Take 1 tablet (10 mg total) by mouth daily.  30 tablet  6   No current facility-administered medications for this visit.    Allergies  Allergen Reactions  . Amoxicillin     REACTION: unspecified  . Penicillins     History   Social History  . Marital Status: Married    Spouse Name: N/A    Number of Children: N/A  . Years of Education: N/A   Occupational History  . Not on file.   Social History Main Topics  . Smoking status: Never Smoker   . Smokeless tobacco: Not on file  . Alcohol Use: Not on file  . Drug Use: Not on file  . Sexually Active: Not on file   Other Topics Concern  . Not on file   Social History Narrative  . No narrative on file    Family History  Problem Relation Age of Onset  . Coronary artery disease Mother   . Hypertension Father     Review of Systems:  As stated in the HPI and otherwise negative.   BP 130/86  Pulse 84  Ht 6' (1.829 m)  Wt 155 lb (  70.308 kg)  BMI 21.02 kg/m2  Physical Examination: General: Well developed, well nourished, NAD HEENT: OP clear, mucus membranes moist SKIN: warm, dry. No rashes. Neuro: No focal deficits Musculoskeletal: Muscle strength 5/5 all ext Psychiatric: Mood and affect normal Neck: No JVD, no carotid bruits, no thyromegaly, no lymphadenopathy. Lungs:Clear bilaterally, no wheezes, rhonci, crackles Cardiovascular: Regular rate and rhythm. No murmurs, gallops or rubs. Abdomen:Soft. Bowel sounds present. Non-tender.  Extremities: No lower extremity edema. Pulses are 2 + in the bilateral DP/PT.  Echo 11/20/11: Left ventricle: The cavity size was normal. Wall thickness was normal. Systolic function was normal. The estimated ejection fraction was in the range of 55% to 65%. Wall motion was normal; there were no regional wall motion abnormalities. Left ventricular diastolic function parameters were normal. - Atrial septum: No defect or patent  foramen ovale was identified.  Assessment and Plan:   1. CAD: Stable. He is on good medical therapy. No changes today. BP well controlled. Lipids well controlled.  Will arrange treadmill stress test to exclude   2. Hyperlipidemia: He is on a statin. He will having lipids and LFTs in primary care next month.

## 2012-06-23 ENCOUNTER — Ambulatory Visit (INDEPENDENT_AMBULATORY_CARE_PROVIDER_SITE_OTHER): Payer: BC Managed Care – PPO | Admitting: Cardiovascular Disease

## 2012-06-23 DIAGNOSIS — R5381 Other malaise: Secondary | ICD-10-CM

## 2012-06-23 DIAGNOSIS — R5383 Other fatigue: Secondary | ICD-10-CM

## 2012-06-23 DIAGNOSIS — I2510 Atherosclerotic heart disease of native coronary artery without angina pectoris: Secondary | ICD-10-CM

## 2012-06-23 NOTE — Progress Notes (Signed)
Exercise Treadmill Test  Pre-Exercise Testing Evaluation Rhythm: normal sinus  Rate: 93     Test  Exercise Tolerance Test Ordering MD: Melene Muller, MD  Interpreting MD: Melene Muller, MD  Unique Test No: 1  Treadmill:  1  Indication for ETT: known ASHD  Contraindication to ETT: No   Stress Modality: exercise - treadmill  Cardiac Imaging Performed: non   Protocol: standard Bruce - maximal  Max BP: 166/82  Max MPHR (bpm):  163 85% MPR (bpm):  139  MPHR obtained (bpm):  169 % MPHR obtained:  104  Reached 85% MPHR (min:sec):  6:15 Total Exercise Time (min-sec):  10:00  Workload in METS:  11.7 Borg Scale: 16  Reason ETT Terminated:  fatigue    ST Segment Analysis At Rest: normal ST segments - no evidence of significant ST depression With Exercise: no evidence of significant ST depression  Other Information Arrhythmia:  No Angina during ETT:  absent (0) Quality of ETT:  non-diagnostic  ETT Interpretation:  normal - no evidence of ischemia by ST analysis  Comments: Pt exercised for 10 minutes of the Bruce protocol. No chest pain. No ischemic EKG changes. Testing stopped due to fatigue.   Recommendations: No further ischemic testing. Continue current meds.

## 2012-08-16 ENCOUNTER — Other Ambulatory Visit: Payer: Self-pay | Admitting: *Deleted

## 2012-08-16 MED ORDER — CLOPIDOGREL BISULFATE 75 MG PO TABS: 75.0000 mg | ORAL_TABLET | Freq: Every day | ORAL | Status: DC

## 2012-09-13 ENCOUNTER — Other Ambulatory Visit: Payer: Self-pay

## 2012-09-13 MED ORDER — NITROGLYCERIN 0.4 MG SL SUBL: 0.4000 mg | SUBLINGUAL_TABLET | SUBLINGUAL | Status: DC | PRN

## 2012-10-17 ENCOUNTER — Other Ambulatory Visit: Payer: Self-pay

## 2012-10-17 DIAGNOSIS — I2510 Atherosclerotic heart disease of native coronary artery without angina pectoris: Secondary | ICD-10-CM

## 2012-10-17 MED ORDER — METOPROLOL SUCCINATE ER 25 MG PO TB24: 12.5000 mg | ORAL_TABLET | Freq: Every day | ORAL | Status: DC

## 2013-01-19 ENCOUNTER — Telehealth: Payer: Self-pay | Admitting: Cardiovascular Disease

## 2013-01-19 NOTE — Telephone Encounter (Signed)
Returned call to patient no answer.LMTC. 

## 2013-01-19 NOTE — Telephone Encounter (Signed)
New problem:  Pt is wanting a call back to discuss his health and how he is to manage it in cold weather... Pt would like a call back.

## 2013-01-19 NOTE — Telephone Encounter (Signed)
Received call back from patient Dr.McAlhany's nurse out of office today.Advised patient not to exercise outside in cold weather,advised to wait until warmest part of the day,but do not exercise outside in extreme cold weather. Message sent to Dr.McAlhany's nurse Enis Slipper RN.

## 2013-03-30 DIAGNOSIS — K449 Diaphragmatic hernia without obstruction or gangrene: Secondary | ICD-10-CM | POA: Insufficient documentation

## 2013-03-30 DIAGNOSIS — J309 Allergic rhinitis, unspecified: Secondary | ICD-10-CM | POA: Insufficient documentation

## 2013-03-30 DIAGNOSIS — K648 Other hemorrhoids: Secondary | ICD-10-CM

## 2013-03-30 DIAGNOSIS — K219 Gastro-esophageal reflux disease without esophagitis: Secondary | ICD-10-CM | POA: Insufficient documentation

## 2013-03-30 DIAGNOSIS — D126 Benign neoplasm of colon, unspecified: Secondary | ICD-10-CM | POA: Insufficient documentation

## 2013-03-30 HISTORY — DX: Other hemorrhoids: K64.8

## 2013-03-30 HISTORY — DX: Diaphragmatic hernia without obstruction or gangrene: K44.9

## 2013-03-30 HISTORY — DX: Allergic rhinitis, unspecified: J30.9

## 2013-05-12 ENCOUNTER — Encounter: Payer: Self-pay | Admitting: Cardiovascular Disease

## 2013-05-12 ENCOUNTER — Ambulatory Visit (INDEPENDENT_AMBULATORY_CARE_PROVIDER_SITE_OTHER): Payer: BC Managed Care – PPO | Admitting: Cardiovascular Disease

## 2013-05-12 VITALS — BP 120/77 | Ht 72.0 in | Wt 156.0 lb

## 2013-05-12 DIAGNOSIS — I2510 Atherosclerotic heart disease of native coronary artery without angina pectoris: Secondary | ICD-10-CM

## 2013-05-12 DIAGNOSIS — E785 Hyperlipidemia, unspecified: Secondary | ICD-10-CM

## 2013-05-12 NOTE — Progress Notes (Signed)
History of Present Illness: 58 yo male with history of CAD, OSA, hyperlipidemia here today for cardiac follow up. He has been followed in the past by Dr. Olevia Perches. I saw him for the first time in April 2012. He had an inferior MI in 2009 treated with a drug-eluting stent to the right coronary artery. In December 2009 he had a non-ST elevation MI treated with drug-eluting stents to the circumflex and LAD. He had a follow-up catheterization in February 2010 which showed nonobstructive disease. Exercise stress myoview June 2014 with no ischemia.   He has been doing well overall. No chest pain or SOB. He exercises regularly.   Primary Care Physician: Dr. Valora Piccolo, [place], [place]).   Last Lipid Profile: Followed in primary care.    Past Medical History  Diagnosis Date  . Coronary artery disease   . Hyperlipidemia   . GERD (gastroesophageal reflux disease)   . Back pain     Past Surgical History  Procedure Laterality Date  . Tonsillectomy      Current Outpatient Prescriptions  Medication Sig Dispense Refill  . aspirin 81 MG tablet Take 81 mg by mouth daily.        . Cholecalciferol (VITAMIN D-3 PO) Take by mouth. 2000 iu  1 tab daily      . clopidogrel (PLAVIX) 75 MG tablet Take 1 tablet (75 mg total) by mouth daily.  30 tablet  11  . fish oil-omega-3 fatty acids 1000 MG capsule Take 2 g by mouth as needed.      . gabapentin (NEURONTIN) 100 MG capsule Take two tabs  At night      . metoprolol succinate (TOPROL XL) 25 MG 24 hr tablet Take 0.5 tablets (12.5 mg total) by mouth daily.  15 tablet  11  . nitroGLYCERIN (NITROSTAT) 0.4 MG SL tablet Place 1 tablet (0.4 mg total) under the tongue every 5 (five) minutes as needed.  25 tablet  6  . rosuvastatin (CRESTOR) 20 MG tablet Take 20 mg by mouth daily.       No current facility-administered medications for this visit.    Allergies  Allergen Reactions  . Amoxicillin     REACTION: unspecified  . Penicillins     History   Social  History  . Marital Status: Married    Spouse Name: N/A    Number of Children: N/A  . Years of Education: N/A   Occupational History  . Not on file.   Social History Main Topics  . Smoking status: Never Smoker   . Smokeless tobacco: Not on file  . Alcohol Use: Not on file  . Drug Use: Not on file  . Sexual Activity: Not on file   Other Topics Concern  . Not on file   Social History Narrative  . No narrative on file    Family History  Problem Relation Age of Onset  . Coronary artery disease Mother   . Hypertension Father     Review of Systems:  As stated in the HPI and otherwise negative.   BP 120/77  Ht 6' (1.829 m)  Wt 156 lb (70.761 kg)  BMI 21.15 kg/m2  Physical Examination: General: Well developed, well nourished, NAD HEENT: OP clear, mucus membranes moist SKIN: warm, dry. No rashes. Neuro: No focal deficits Musculoskeletal: Muscle strength 5/5 all ext Psychiatric: Mood and affect normal Neck: No JVD, no carotid bruits, no thyromegaly, no lymphadenopathy. Lungs:Clear bilaterally, no wheezes, rhonci, crackles Cardiovascular: Regular rate and rhythm. No murmurs,  gallops or rubs. Abdomen:Soft. Bowel sounds present. Non-tender.  Extremities: No lower extremity edema. Pulses are 2 + in the bilateral DP/PT.  Echo 11/20/11: Left ventricle: The cavity size was normal. Wall thickness was normal. Systolic function was normal. The estimated ejection fraction was in the range of 55% to 65%. Wall motion was normal; there were no regional wall motion abnormalities. Left ventricular diastolic function parameters were normal. - Atrial septum: No defect or patent foramen ovale was identified.  Assessment and Plan:   1. CAD: Stable. He is on good medical therapy. No changes today. BP well controlled. Lipids well controlled. Exercise treadmill stress test June 2014 with no ischemia.   2. Hyperlipidemia: He is on a statin. Lipids followed in primary care.

## 2013-05-12 NOTE — Patient Instructions (Signed)
Your physician wants you to follow-up in:  12 months.  You will receive a reminder letter in the mail two months in advance. If you don't receive a letter, please call our office to schedule the follow-up appointment.   

## 2013-07-24 ENCOUNTER — Telehealth: Payer: Self-pay | Admitting: Cardiovascular Disease

## 2013-07-24 NOTE — Telephone Encounter (Signed)
New message           Pt would like to know if Angelena Form could see his wife sooner / pt has a new pt appt on 8/25 with mcalhany

## 2013-07-24 NOTE — Telephone Encounter (Signed)
LMTCB

## 2013-07-27 NOTE — Telephone Encounter (Signed)
SEE OTHER PHONE NOTE   CALL WAS RE  WIFE  NOT   THIS PT .Adonis Housekeeper

## 2013-07-27 NOTE — Telephone Encounter (Signed)
LMTCB ./CY 

## 2013-08-14 ENCOUNTER — Other Ambulatory Visit: Payer: Self-pay

## 2013-08-14 MED ORDER — CLOPIDOGREL BISULFATE 75 MG PO TABS: 75.0000 mg | ORAL_TABLET | Freq: Every day | ORAL | Status: DC

## 2013-10-17 ENCOUNTER — Other Ambulatory Visit: Payer: Self-pay | Admitting: *Deleted

## 2013-10-17 MED ORDER — METOPROLOL SUCCINATE ER 25 MG PO TB24: 12.5000 mg | ORAL_TABLET | Freq: Every day | ORAL | Status: DC

## 2013-12-19 ENCOUNTER — Other Ambulatory Visit: Payer: Self-pay

## 2013-12-19 MED ORDER — CLOPIDOGREL BISULFATE 75 MG PO TABS: 75.0000 mg | ORAL_TABLET | Freq: Every day | ORAL | Status: DC

## 2014-01-17 ENCOUNTER — Telehealth: Payer: Self-pay | Admitting: Cardiovascular Disease

## 2014-01-17 NOTE — Telephone Encounter (Signed)
Calling stating he is wanting Dr. Julianne Handler to change his Metoprolol and Plavix to twice a day due to the "weather".  He states when the temperature gets below 32 degrees and he is outside he gets an uncomfortable feeling in his chest and he thinks by changing his medication to twice a day would help.  Advised that he could not take his Plavix but once a day.  Tried to explain to him that the weather should not effect his medications.  States he does not have the uncomfortable feeling in chest when inside just when temp below 32 degrees.  He insists that he see Dr. Julianne Handler to discuss but advised he did not have an opening until February. Then he insists that Dr. Julianne Handler be called and inform him that he wants to change the medication.  Advised again that he is on the long acting Metoprolol and would have to be changed to short acting but really would not make any difference as far as weather is concerned.  He still does not seem to understand the dose of medication does not relate to weather.  Will forward to Dr. Julianne Handler for review.

## 2014-01-17 NOTE — Telephone Encounter (Signed)
Notified of Dr. Reatha Armour recommendations.  Again advised the temperature is not related to his medications.  Advised that he just needs to bundle up.  He states he does but still feels like changing his medication would help.  Again advised weather not related to his meds.  He still disagreed.

## 2014-01-17 NOTE — Telephone Encounter (Signed)
New Message  Pt wanted to see Dr. Angelena Form ASAP to ask about his medications.  Is aware taht his next avail appt is not until Feb. He wanted to know, since the cold weather has started, if he can start taking his metropolol and plavix twice/day (1 in am, 1 in pm) instead of once(in the am). Please call back and discuss.

## 2014-01-17 NOTE — Telephone Encounter (Signed)
Thanks Rodena Piety. Can you let him know that you spoke to me and I would like to continue there current meds. I agree that the dose should remain the same. He should avoid extremes in the temperature. Thanks, chris

## 2014-04-17 ENCOUNTER — Telehealth: Payer: Self-pay | Admitting: Cardiovascular Disease

## 2014-04-17 DIAGNOSIS — I2510 Atherosclerotic heart disease of native coronary artery without angina pectoris: Secondary | ICD-10-CM

## 2014-04-17 DIAGNOSIS — E785 Hyperlipidemia, unspecified: Secondary | ICD-10-CM

## 2014-04-17 NOTE — Telephone Encounter (Signed)
Pt states he has not had any lab and particularly his cholesterol checked in at least 6 months and probably more than a year. Pt has an appt with Dr Angelena Form 04/19/14 and is requesting to come for lab, if ordered by Dr Angelena Form, tomorrow prior to appt 04/19/14.   Pt advised I will forward to Dr Angelena Form for review.

## 2014-04-17 NOTE — Telephone Encounter (Signed)
New message     Pt has an appt on thurs.  He want to come in tomorrow for a cholesterol test but there is no order in the system.  Will the doctor ok a lab visit for tomorrow?

## 2014-04-18 ENCOUNTER — Other Ambulatory Visit (INDEPENDENT_AMBULATORY_CARE_PROVIDER_SITE_OTHER): Payer: BC Managed Care – PPO | Admitting: *Deleted

## 2014-04-18 ENCOUNTER — Other Ambulatory Visit: Payer: Self-pay

## 2014-04-18 DIAGNOSIS — I2510 Atherosclerotic heart disease of native coronary artery without angina pectoris: Secondary | ICD-10-CM

## 2014-04-18 DIAGNOSIS — E785 Hyperlipidemia, unspecified: Secondary | ICD-10-CM | POA: Diagnosis not present

## 2014-04-18 MED ORDER — METOPROLOL SUCCINATE ER 25 MG PO TB24: 12.5000 mg | ORAL_TABLET | Freq: Every day | ORAL | Status: DC

## 2014-04-18 NOTE — Telephone Encounter (Signed)
[name], I thought his labs were followed in primary care but if he wishes to have cholesterol and CMET we can do this. [name]

## 2014-04-18 NOTE — Telephone Encounter (Signed)
Spoke with pt. He ate a bowl of cereal about 7:30 AM today but has not eaten since.  He will continue to fast today and come in for lab work around 3:30 -4:00 today.

## 2014-04-19 ENCOUNTER — Encounter: Payer: Self-pay | Admitting: Cardiovascular Disease

## 2014-04-19 ENCOUNTER — Ambulatory Visit (INDEPENDENT_AMBULATORY_CARE_PROVIDER_SITE_OTHER): Payer: BC Managed Care – PPO | Admitting: Cardiovascular Disease

## 2014-04-19 VITALS — BP 104/62 | HR 83 | Ht 72.0 in | Wt 155.0 lb

## 2014-04-19 DIAGNOSIS — E785 Hyperlipidemia, unspecified: Secondary | ICD-10-CM

## 2014-04-19 DIAGNOSIS — I2510 Atherosclerotic heart disease of native coronary artery without angina pectoris: Secondary | ICD-10-CM | POA: Diagnosis not present

## 2014-04-19 LAB — BASIC METABOLIC PANEL
BUN: 13 mg/dL (ref 6–23)
CALCIUM: 9.9 mg/dL (ref 8.4–10.5)
CO2: 29 mEq/L (ref 19–32)
Chloride: 103 mEq/L (ref 96–112)
Creatinine, Ser: 1.03 mg/dL (ref 0.40–1.50)
GFR: 78.54 mL/min (ref >60.00)
Glucose, Bld: 94 mg/dL (ref 70–99)
Potassium: 3.9 mEq/L (ref 3.5–5.1)
SODIUM: 138 meq/L (ref 135–145)

## 2014-04-19 LAB — HEPATIC FUNCTION PANEL
ALT: 23 U/L (ref 0–53)
AST: 20 U/L (ref 0–37)
Albumin: 4.6 g/dL (ref 3.5–5.2)
Alkaline Phosphatase: 57 U/L (ref 39–117)
BILIRUBIN TOTAL: 0.7 mg/dL (ref 0.2–1.2)
Bilirubin, Direct: 0.1 mg/dL (ref 0.0–0.3)
Total Protein: 7.6 g/dL (ref 6.0–8.3)

## 2014-04-19 LAB — LIPID PANEL
Cholesterol: 167 mg/dL (ref 0–200)
HDL: 46 mg/dL (ref >39.00)
LDL Cholesterol: 102 mg/dL — ABNORMAL HIGH (ref 0–99)
NonHDL: 121
Total CHOL/HDL Ratio: 4
Triglycerides: 94 mg/dL (ref 0.0–149.0)
VLDL: 18.8 mg/dL (ref 0.0–40.0)

## 2014-04-19 MED ORDER — METOPROLOL SUCCINATE ER 25 MG PO TB24: 12.5000 mg | ORAL_TABLET | Freq: Every day | ORAL | Status: DC

## 2014-04-19 NOTE — Progress Notes (Signed)
Chief Complaint  Patient presents with  . Coronary Artery Disease    History of Present Illness: 59yo male with history of CAD, OSA, hyperlipidemia here today for cardiac follow up. He had an inferior MI in 2009 treated with a drug-eluting stent placed in the right coronary artery. In December 2009 he had a non-ST elevation MI treated with drug-eluting stents placed in the circumflex and LAD. He had a follow-up catheterization in February 2010 which showed nonobstructive disease. Exercise stress myoview June 2014 with no ischemia.   He has been doing well overall. No chest pain or SOB. He exercises regularly. Some pain lateral left thigh at night and has been told this is sciatica.   Primary Care Physician: Dr. Valora Piccolo, [place], [place]).    Past Medical History  Diagnosis Date  . Coronary artery disease   . Hyperlipidemia   . GERD (gastroesophageal reflux disease)   . Back pain     Past Surgical History  Procedure Laterality Date  . Tonsillectomy      Current Outpatient Prescriptions  Medication Sig Dispense Refill  . aspirin 81 MG tablet Take 81 mg by mouth daily.      Gretta Arab POWD Take 0.25 Doses by mouth as needed (general health).    . Cholecalciferol (VITAMIN D-3 PO) Take by mouth. 2000 iu  1 tab daily    . clopidogrel (PLAVIX) 75 MG tablet Take 1 tablet (75 mg total) by mouth daily. 30 tablet 3  . fish oil-omega-3 fatty acids 1000 MG capsule Take 2 g by mouth as needed.    . gabapentin (NEURONTIN) 100 MG capsule Take two tabs  At night    . Lactobacillus (PROBIOTIC CHILDRENS PO) Take 1 capsule by mouth as needed (after taking an  antibiotic).    . metoprolol succinate (TOPROL XL) 25 MG 24 hr tablet Take 0.5 tablets (12.5 mg total) by mouth daily. 15 tablet 1  . nitroGLYCERIN (NITROSTAT) 0.4 MG SL tablet Place 1 tablet (0.4 mg total) under the tongue every 5 (five) minutes as needed. 25 tablet 6  . rosuvastatin (CRESTOR) 20 MG tablet Take 20 mg by mouth daily.    . Saw  Palmetto 450 MG CAPS Take 450 mg by mouth as needed (as a health supplement).      No current facility-administered medications for this visit.    Allergies  Allergen Reactions  . Amoxicillin     REACTION: unspecified  . Citalopram Hydrobromide     Other reaction(s): DROWSINESS  . Clarithromycin     Other reaction(s): DIARRHEA  . Guaifenesin     Other reaction(s): HEADACHE  . Penicillins     Other reaction(s): SWELLING  . Streptomycin     Other reaction(s): CHOKES    History   Social History  . Marital Status: Married    Spouse Name: N/A  . Number of Children: N/A  . Years of Education: N/A   Occupational History  . Not on file.   Social History Main Topics  . Smoking status: Never Smoker   . Smokeless tobacco: Not on file  . Alcohol Use: Not on file  . Drug Use: Not on file  . Sexual Activity: Not on file   Other Topics Concern  . Not on file   Social History Narrative    Family History  Problem Relation Age of Onset  . Coronary artery disease Mother   . Hypertension Father   . Heart attack Paternal Grandfather     Review of  Systems:  As stated in the HPI and otherwise negative.   BP 104/62 mmHg  Pulse 83  Ht 6' (1.829 m)  Wt 155 lb (70.308 kg)  BMI 21.02 kg/m2  Physical Examination: General: Well developed, well nourished, NAD HEENT: OP clear, mucus membranes moist SKIN: warm, dry. No rashes. Neuro: No focal deficits Musculoskeletal: Muscle strength 5/5 all ext Psychiatric: Mood and affect normal Neck: No JVD, no carotid bruits, no thyromegaly, no lymphadenopathy. Lungs:Clear bilaterally, no wheezes, rhonci, crackles Cardiovascular: Regular rate and rhythm. No murmurs, gallops or rubs. Abdomen:Soft. Bowel sounds present. Non-tender.  Extremities: No lower extremity edema. Pulses are 2 + in the bilateral DP/PT.  Echo 11/20/11: Left ventricle: The cavity size was normal. Wall thickness was normal. Systolic function was normal. The  estimated ejection fraction was in the range of 55% to 65%. Wall motion was normal; there were no regional wall motion abnormalities. Left ventricular diastolic function parameters were normal. - Atrial septum: No defect or patent foramen ovale was Identified.  EKG:  EKG is ordered today. The ekg ordered today demonstrates NSR, rate 83 bpm.   Recent Labs: 04/18/2014: ALT 23; BUN 13; Creatinine 1.03; Potassium 3.9; Sodium 138   Lipid Panel    Component Value Date/Time   CHOL 167 04/18/2014 1613   TRIG 94.0 04/18/2014 1613   HDL 46.00 04/18/2014 1613   CHOLHDL 4 04/18/2014 1613   VLDL 18.8 04/18/2014 1613   LDLCALC 102* 04/18/2014 1613   LDLDIRECT 146.2 11/20/2009 0907     Wt Readings from Last 3 Encounters:  04/19/14 155 lb (70.308 kg)  05/12/13 156 lb (70.761 kg)  05/11/12 155 lb (70.308 kg)     Other studies Reviewed: Additional studies/ records that were reviewed today include:  Review of the above records demonstrates:   Assessment and Plan:   1. CAD: Stable. He is on good medical therapy. No changes today. BP well controlled. Exercise treadmill stress test June 2014 with no ischemia.   2. Hyperlipidemia: He is on a statin. Lipids followed in primary care.   Current medicines are reviewed at length with the patient today.  The patient does not have concerns regarding medicines.  The following changes have been made:  no change  Labs/ tests ordered today include:  No orders of the defined types were placed in this encounter.    Disposition:   FU with me in 12  months  Signed, Lauree Chandler, MD 04/19/2014 4:08 PM    [facility] [place], [place], [place]  [address] Phone: [phone]; Fax: [phone]

## 2014-04-19 NOTE — Patient Instructions (Signed)
Medication Instructions:  Your physician recommends that you continue on your current medications as directed. Please refer to the Current Medication list given to you today.   Labwork: none  Testing/Procedures: none  Follow-Up: Your physician wants you to follow-up in:  12 months.  You will receive a reminder letter in the mail two months in advance. If you don't receive a letter, please call our office to schedule the follow-up appointment.        

## 2014-04-20 ENCOUNTER — Telehealth: Payer: Self-pay | Admitting: Cardiovascular Disease

## 2014-04-20 NOTE — Telephone Encounter (Signed)
New Message      Pt calling to request for Fraser Din to send him a copy of his lab results. Call back if you have any questions.

## 2014-04-23 NOTE — Telephone Encounter (Signed)
Copy of lab results sent to pt

## 2014-04-24 ENCOUNTER — Other Ambulatory Visit: Payer: Self-pay | Admitting: *Deleted

## 2014-04-24 MED ORDER — CLOPIDOGREL BISULFATE 75 MG PO TABS: 75.0000 mg | ORAL_TABLET | Freq: Every day | ORAL | Status: DC

## 2014-08-16 DIAGNOSIS — F5101 Primary insomnia: Secondary | ICD-10-CM | POA: Insufficient documentation

## 2014-09-13 ENCOUNTER — Other Ambulatory Visit: Payer: Self-pay

## 2014-09-13 MED ORDER — NITROGLYCERIN 0.4 MG SL SUBL: 0.4000 mg | SUBLINGUAL_TABLET | SUBLINGUAL | Status: DC | PRN

## 2014-09-13 NOTE — Telephone Encounter (Signed)
[name] Blanks, MD at 04/19/2014 3:16 PM   nitroGLYCERIN (NITROSTAT) 0.4 MG SL tablet Place 1 tablet (0.4 mg total) under the tongue every 5 (five) minutes as needed         Patient Instructions     Medication Instructions:  Your physician recommends that you continue on your current medications as directed. Please refer to the Current Medication list given to you today.

## 2015-04-17 ENCOUNTER — Ambulatory Visit: Payer: BC Managed Care – PPO | Admitting: Cardiovascular Disease

## 2015-04-18 ENCOUNTER — Ambulatory Visit (INDEPENDENT_AMBULATORY_CARE_PROVIDER_SITE_OTHER): Payer: BC Managed Care – PPO | Admitting: Cardiovascular Disease

## 2015-04-18 ENCOUNTER — Encounter: Payer: Self-pay | Admitting: Cardiovascular Disease

## 2015-04-18 ENCOUNTER — Ambulatory Visit: Payer: BC Managed Care – PPO | Admitting: Cardiovascular Disease

## 2015-04-18 VITALS — BP 118/88 | HR 79 | Ht 72.0 in | Wt 159.0 lb

## 2015-04-18 DIAGNOSIS — R000 Tachycardia, unspecified: Secondary | ICD-10-CM

## 2015-04-18 DIAGNOSIS — E785 Hyperlipidemia, unspecified: Secondary | ICD-10-CM

## 2015-04-18 DIAGNOSIS — I2510 Atherosclerotic heart disease of native coronary artery without angina pectoris: Secondary | ICD-10-CM | POA: Diagnosis not present

## 2015-04-18 LAB — CBC WITH DIFFERENTIAL/PLATELET
BASOS ABS: 61 {cells}/uL (ref 0–200)
Basophils Relative: 1 %
EOS ABS: 122 {cells}/uL (ref 15–500)
Eosinophils Relative: 2 %
HCT: 42.8 % (ref 38.5–50.0)
Hemoglobin: 14.5 g/dL (ref 13.2–17.1)
LYMPHS ABS: 1525 {cells}/uL (ref 850–3900)
Lymphocytes Relative: 25 %
MCH: 29.6 pg (ref 27.0–33.0)
MCHC: 33.9 g/dL (ref 32.0–36.0)
MCV: 87.3 fL (ref 80.0–100.0)
MONOS PCT: 12 %
MPV: 10.3 fL (ref 7.5–12.5)
Monocytes Absolute: 732 cells/uL (ref 200–950)
Neutro Abs: 3660 cells/uL (ref 1500–7800)
Neutrophils Relative %: 60 %
PLATELETS: 230 10*3/uL (ref 140–400)
RBC: 4.9 MIL/uL (ref 4.20–5.80)
RDW: 13.6 % (ref 11.0–15.0)
WBC: 6.1 10*3/uL (ref 3.8–10.8)

## 2015-04-18 LAB — BASIC METABOLIC PANEL
BUN: 14 mg/dL (ref 7–25)
CO2: 26 mmol/L (ref 20–31)
CREATININE: 1.01 mg/dL (ref 0.70–1.25)
Calcium: 9.3 mg/dL (ref 8.6–10.3)
Chloride: 102 mmol/L (ref 98–110)
Glucose, Bld: 126 mg/dL — ABNORMAL HIGH (ref 65–99)
POTASSIUM: 3.8 mmol/L (ref 3.5–5.3)
Sodium: 139 mmol/L (ref 135–146)

## 2015-04-18 LAB — TSH: TSH: 1.83 mIU/L (ref 0.40–4.50)

## 2015-04-18 MED ORDER — METOPROLOL SUCCINATE ER 25 MG PO TB24: 25.0000 mg | ORAL_TABLET | Freq: Every day | ORAL | Status: DC

## 2015-04-18 NOTE — Patient Instructions (Signed)
Medication Instructions:  Your physician has recommended you make the following change in your medication:  Increase Toprol to 25 mg by mouth daily.   Labwork: Lab work to be done today--BMP, TSH, CBC  Testing/Procedures: none  Follow-Up: Your physician wants you to follow-up in:  6 months.  You will receive a reminder letter in the mail two months in advance. If you don't receive a letter, please call our office to schedule the follow-up appointment.   Any Other Special Instructions Will Be Listed Below (If Applicable).     If you need a refill on your cardiac medications before your next appointment, please call your pharmacy.

## 2015-04-18 NOTE — Progress Notes (Signed)
Chief Complaint  Patient presents with  . Tachycardia    no complaints    History of Present Illness: 60 yo male with history of CAD, OSA, hyperlipidemia here today for cardiac follow up. He had an inferior MI in 2009 treated with a drug-eluting stent placed in the right coronary artery. In December 2009 he had a non-ST elevation MI treated with drug-eluting stents placed in the circumflex and LAD. He had a follow-up catheterization in February 2010 which showed nonobstructive disease. Exercise stress myoview June 2014 with no ischemia.   He has been doing well overall. No chest pain or SOB. He has felt his heart racing several times over the last few weeks. HR has been 80-110 bpm per his records. He has had recent back pain and has been using prednisone. No LE edema. He did have a recent long car trip.   Primary Care Physician: Dr. Valora Piccolo, [place], [place]).    Past Medical History  Diagnosis Date  . Coronary artery disease   . Hyperlipidemia   . GERD (gastroesophageal reflux disease)   . Back pain     Past Surgical History  Procedure Laterality Date  . Tonsillectomy      Current Outpatient Prescriptions  Medication Sig Dispense Refill  . acetaminophen (TYLENOL) 500 MG tablet Take 500 mg by mouth every 4 (four) hours as needed for mild pain.    Marland Kitchen aspirin 81 MG tablet Take 81 mg by mouth daily.      Gretta Arab POWD Take 0.25 Doses by mouth as needed (general health).    . Cholecalciferol (VITAMIN D-3 PO) Take by mouth. 2000 iu  1 tab daily    . clopidogrel (PLAVIX) 75 MG tablet Take 1 tablet (75 mg total) by mouth daily. 30 tablet 11  . Lactobacillus (PROBIOTIC CHILDRENS PO) Take 1 capsule by mouth as needed (after taking an  antibiotic).    . Melatonin 3 MG TABS Take 3 mg by mouth as directed.    . metoprolol succinate (TOPROL XL) 25 MG 24 hr tablet Take 1 tablet (25 mg total) by mouth daily. 30 tablet 11  . nitroGLYCERIN (NITROSTAT) 0.4 MG SL tablet Place 1 tablet (0.4 mg  total) under the tongue every 5 (five) minutes as needed. 25 tablet 3  . pantoprazole (PROTONIX) 40 MG tablet Take 40 mg by mouth daily.    . rosuvastatin (CRESTOR) 20 MG tablet Take 20 mg by mouth daily.    . Saw Palmetto 450 MG CAPS Take 450 mg by mouth as needed (as a health supplement).      No current facility-administered medications for this visit.    Allergies  Allergen Reactions  . Amoxicillin     REACTION: unspecified  . Citalopram Hydrobromide     Other reaction(s): DROWSINESS  . Clarithromycin     Other reaction(s): DIARRHEA  . Guaifenesin     Other reaction(s): HEADACHE  . Penicillins     Other reaction(s): SWELLING  . Streptomycin     Other reaction(s): CHOKES    Social History   Social History  . Marital Status: Married    Spouse Name: N/A  . Number of Children: N/A  . Years of Education: N/A   Occupational History  . Not on file.   Social History Main Topics  . Smoking status: Never Smoker   . Smokeless tobacco: Not on file  . Alcohol Use: Not on file  . Drug Use: Not on file  . Sexual Activity: Not on  file   Other Topics Concern  . Not on file   Social History Narrative    Family History  Problem Relation Age of Onset  . Coronary artery disease Mother   . Hypertension Father   . Heart attack Paternal Grandfather     Review of Systems:  As stated in the HPI and otherwise negative.   BP 118/88 mmHg  Pulse 79  Ht 6' (1.829 m)  Wt 159 lb (72.122 kg)  BMI 21.56 kg/m2  SpO2 98%  Physical Examination: General: Well developed, well nourished, NAD HEENT: OP clear, mucus membranes moist SKIN: warm, dry. No rashes. Neuro: No focal deficits Musculoskeletal: Muscle strength 5/5 all ext Psychiatric: Mood and affect normal Neck: No JVD, no carotid bruits, no thyromegaly, no lymphadenopathy. Lungs:Clear bilaterally, no wheezes, rhonci, crackles Cardiovascular: Regular rate and rhythm. No murmurs, gallops or rubs. Abdomen:Soft. Bowel sounds  present. Non-tender.  Extremities: No lower extremity edema. Pulses are 2 + in the bilateral DP/PT.  Echo 11/20/11: Left ventricle: The cavity size was normal. Wall thickness was normal. Systolic function was normal. The estimated ejection fraction was in the range of 55% to 65%. Wall motion was normal; there were no regional wall motion abnormalities. Left ventricular diastolic function parameters were normal. - Atrial septum: No defect or patent foramen ovale was Identified.  EKG:  EKG is ordered today. The ekg ordered today demonstrates NSR, rate 79 bpm.   Recent Labs: No results found for requested labs within last 365 days.   Lipid Panel    Component Value Date/Time   CHOL 167 04/18/2014 1613   TRIG 94.0 04/18/2014 1613   HDL 46.00 04/18/2014 1613   CHOLHDL 4 04/18/2014 1613   VLDL 18.8 04/18/2014 1613   LDLCALC 102* 04/18/2014 1613   LDLDIRECT 146.2 11/20/2009 0907     Wt Readings from Last 3 Encounters:  04/18/15 159 lb (72.122 kg)  04/19/14 155 lb (70.308 kg)  05/12/13 156 lb (70.761 kg)     Other studies Reviewed: Additional studies/ records that were reviewed today include:  Review of the above records demonstrates:   Assessment and Plan:   1. CAD: Stable. He is on good medical therapy. No changes today. BP well controlled. Exercise treadmill stress test June 2014 with no ischemia.   2. Hyperlipidemia: He is on a statin. Lipids followed in primary care.   3. Tachycardia: His HR is likely elevated due to his recent prednisone use. EKG is overall normal today. He is in sinus. Will check TSH, BMET and CBC. Increase Toprol to 25 mg daily. He will call back in 10 days and let us know how he feels.   Current medicines are reviewed at length with the patient today.  The patient does not have concerns regarding medicines.  The following changes have been made:  no change  Labs/ tests ordered today include:   Orders Placed This Encounter  Procedures  . Basic  Metabolic Panel (BMET)  . CBC w/Diff  . TSH  . EKG 12-Lead    Disposition:   FU with me in 6  months  Signed, Lauree Chandler, MD 04/18/2015 4:51 PM    [facility] [place], [place], [place]  [address] Phone: [phone]; Fax: [phone]

## 2015-05-07 ENCOUNTER — Telehealth: Payer: Self-pay | Admitting: Cardiovascular Disease

## 2015-05-07 NOTE — Telephone Encounter (Signed)
Spoke with pt and told him Dr. Angelena Form had reviewed readings.  Pt will bring BP readings and cuff to next appt

## 2015-05-07 NOTE — Telephone Encounter (Signed)
error 

## 2015-05-07 NOTE — Telephone Encounter (Signed)
Left message for pt to call back  °

## 2015-05-07 NOTE — Telephone Encounter (Signed)
New message  Pt following up on BP readings he faxed today. Please call back and discuss.

## 2015-05-07 NOTE — Telephone Encounter (Signed)
Spoke with pt and told him I have not received fax.  He will refax to our office. Pt also reports he had chest heaviness Saturday evening after working in yard. Describes as an uncomfortable feeling.  Also had burning in throat when working in the yard.  He took tylenol Saturday evening and went to bed.  He was able to sleep.  Sunday morning he was still feeling bad so took 3 NTG over a 15 minute with relief of heaviness. Today he reports he is feeling OK.  No more episodes of heaviness. He called our office earlier and scheduled appt with Cecilie Kicks, NP for May 21, 2015.  I told pt to let us know if he has these symptoms again prior to appt or to go to ED if symptoms severe

## 2015-05-07 NOTE — Telephone Encounter (Signed)
Fax of BP readings received.  Medical records has scanned in (under media tab)

## 2015-05-07 NOTE — Telephone Encounter (Signed)
I reviewed these. They are ok with a few exceptions that are on the lower side. Can we ask him to bring log of pressures and HR with him to next appt? chris

## 2015-05-07 NOTE — Telephone Encounter (Signed)
Agree. cdm 

## 2015-05-15 ENCOUNTER — Other Ambulatory Visit: Payer: Self-pay | Admitting: Cardiovascular Disease

## 2015-05-21 ENCOUNTER — Ambulatory Visit (INDEPENDENT_AMBULATORY_CARE_PROVIDER_SITE_OTHER): Payer: BC Managed Care – PPO | Admitting: Cardiology

## 2015-05-21 ENCOUNTER — Encounter: Payer: Self-pay | Admitting: Cardiology

## 2015-05-21 VITALS — BP 116/82 | HR 88 | Ht 72.0 in | Wt 157.4 lb

## 2015-05-21 DIAGNOSIS — R0789 Other chest pain: Secondary | ICD-10-CM | POA: Diagnosis not present

## 2015-05-21 DIAGNOSIS — I2510 Atherosclerotic heart disease of native coronary artery without angina pectoris: Secondary | ICD-10-CM | POA: Diagnosis not present

## 2015-05-21 DIAGNOSIS — I959 Hypotension, unspecified: Secondary | ICD-10-CM

## 2015-05-21 DIAGNOSIS — E785 Hyperlipidemia, unspecified: Secondary | ICD-10-CM | POA: Diagnosis not present

## 2015-05-21 MED ORDER — METOPROLOL SUCCINATE ER 25 MG PO TB24: 12.5000 mg | ORAL_TABLET | Freq: Every day | ORAL | Status: DC

## 2015-05-21 NOTE — Patient Instructions (Signed)
Medication Instructions:  1) DECREASE your Metoprolol Succinate to a half tab (12.5mg )  Labwork: None  Testing/Procedures: Your physician has requested that you have en exercise stress myoview. For further information please visit [email or web address]. Please follow instruction sheet, as given.    Follow-Up: Your physician recommends that you schedule a follow-up appointment after stress test is completed with Dr. Angelena Form or an extender on a day that Dr. Angelena Form is in the office.   Any Other Special Instructions Will Be Listed Below (If Applicable).     If you need a refill on your cardiac medications before your next appointment, please call your pharmacy.

## 2015-05-21 NOTE — Progress Notes (Signed)
Cardiology Office Note   Date:  05/21/2015   ID:  Nachman Guttery, DOB 1956/01/01, MRN [id number]  PCP:  Lilian Coma., MD  Cardiologist:  Dr. Clydene Fake    No chief complaint on file.     History of Present Illness: [name] is a 60 y.o. male who presents for BP eval after calling in with low BPs.   He has a history of CAD, OSA, hyperlipidemia here today for cardiac follow up. He had an inferior MI in 2009 treated with a drug-eluting stent placed in the right coronary artery. In December 2009 he had a non-ST elevation MI treated with drug-eluting stents placed in the circumflex and LAD. He had a follow-up catheterization in February 2010 which showed nonobstructive disease. Exercise stress myoview June 2014 with no ischemia.   On last visit with Dr. Aundra Dubin pt was doing well.  He has since called back with low BP and is here today for eval.     Per his home BP checks BP to 93/67 ; 82/64;  Mostly 104 or so /76.  Our BP check is about the same as his in the office.  He also relates an episode of chest pressure and it took 3 NTG sl before relief.  On another day he was mowing the grass and had chest/throat burning.  With rest it resolved.  This has been occuring if he increases the mowing ie ditches and hills. It does not feel like his indigestion.   Past Medical History  Diagnosis Date  . Coronary artery disease   . Hyperlipidemia   . GERD (gastroesophageal reflux disease)   . Back pain     Past Surgical History  Procedure Laterality Date  . Tonsillectomy       Current Outpatient Prescriptions  Medication Sig Dispense Refill  . acetaminophen (TYLENOL) 500 MG tablet Take 500 mg by mouth every 4 (four) hours as needed for mild pain.    Marland Kitchen aspirin 81 MG tablet Take 81 mg by mouth daily.      Gretta Arab POWD Take 0.25 Doses by mouth as needed (general health).    . Cholecalciferol (VITAMIN D-3 PO) Take by mouth. 2000 iu  1 tab daily    . clopidogrel (PLAVIX) 75 MG tablet take 1  tablet by mouth once daily 30 tablet 11  . Lactobacillus (PROBIOTIC CHILDRENS PO) Take 1 capsule by mouth as needed (after taking an  antibiotic).    . Melatonin 3 MG TABS Take 3 mg by mouth as directed.    . metoprolol succinate (TOPROL XL) 25 MG 24 hr tablet Take 1 tablet (25 mg total) by mouth daily. 30 tablet 11  . nitroGLYCERIN (NITROSTAT) 0.4 MG SL tablet Place 1 tablet (0.4 mg total) under the tongue every 5 (five) minutes as needed. 25 tablet 3  . pantoprazole (PROTONIX) 40 MG tablet Take 40 mg by mouth daily.    . rosuvastatin (CRESTOR) 20 MG tablet Take 20 mg by mouth daily.    . Saw Palmetto 450 MG CAPS Take 450 mg by mouth as needed (as a health supplement).      No current facility-administered medications for this visit.    Allergies:   Amoxicillin; Citalopram hydrobromide; Clarithromycin; Guaifenesin; Penicillins; and Streptomycin    Social History:  The patient  reports that he has never smoked. He does not have any smokeless tobacco history on file.   Family History:  The patient's family history includes Coronary artery disease in his mother; Heart attack  in his paternal grandfather; Hypertension in his father.    ROS:  General:no colds or fevers, no weight changes Skin:no rashes or ulcers HEENT:no blurred vision, no congestion CV:see HPI PUL:see HPI GI:no diarrhea constipation or melena, no indigestion GU:no hematuria, no dysuria MS:no joint pain, no claudication Neuro:no syncope, no lightheadedness Endo:no diabetes, no thyroid disease  Wt Readings from Last 3 Encounters:  04/18/15 159 lb (72.122 kg)  04/19/14 155 lb (70.308 kg)  05/12/13 156 lb (70.761 kg)     PHYSICAL EXAM: VS:  There were no vitals taken for this visit. , BMI There is no weight on file to calculate BMI. General:Pleasant affect, NAD Skin:Warm and dry, brisk capillary refill HEENT:normocephalic, sclera clear, mucus membranes moist Neck:supple, no JVD, no bruits  Heart:S1S2 RRR without  murmur, gallup, rub or click Lungs:clear without rales, rhonchi, or wheezes [id number], non tender, + BS, do not palpate liver spleen or masses Ext:no lower ext edema, 2+ pedal pulses, 2+ radial pulses Neuro:alert and oriented X 3, MAE, follows commands, + facial symmetry    EKG:  EKG is ordered today. The ekg ordered today demonstrates SR no acute changes.    Recent Labs: 04/18/2015: BUN 14; Creat 1.01; Hemoglobin 14.5; Platelets 230; Potassium 3.8; Sodium 139; TSH 1.83    Lipid Panel    Component Value Date/Time   CHOL 167 04/18/2014 1613   TRIG 94.0 04/18/2014 1613   HDL 46.00 04/18/2014 1613   CHOLHDL 4 04/18/2014 1613   VLDL 18.8 04/18/2014 1613   LDLCALC 102* 04/18/2014 1613   LDLDIRECT 146.2 11/20/2009 0907     MORE RECENT LIPIDS DONE THROUGH [facility] :: TCHOL 179 LDL 102 TG 79 HDL 52  Other studies Reviewed: Additional studies/ records that were reviewed today include: . Echo 11/20/11: Left ventricle: The cavity size was normal. Wall thickness was normal. Systolic function was normal. The estimated ejection fraction was in the range of 55% to 65%. Wall motion was normal; there were no regional wall motion abnormalities. Left ventricular diastolic function parameters were normal. - Atrial septum: No defect or patent foramen ovale was Identified.  ASSESSMENT AND PLAN:  1. Hypotension BP is lower than his usual with increased fatigue.  He has changed from coffee and black tea to green tea.  I am decreasing his toprol to 12.5 mg daily.    2.  Chest pain with CAD  One episode at rest relief with 3 NTG.  Will do exercise myoivew to evaluate.  3. CAD: . He is on good medical therapy.but with chest pain see above.  4. Hyperlipidemia: He is on a statin. Lipids followed in primary care. Improved lipid panel  5. Tachycardia: His HR is likely elevated due to his recent prednisone use. EKG is overall normal today. He is in sinus. toprol was increased with increased  HR but with low BP have decreased back to 12.5.  He will follow up with APP on day Dr. Angelena Form is in the office. If stress is positive may need cath.   Current medicines are reviewed with the patient today.  The patient Has no concerns regarding medicines.  The following changes have been made:  See above Labs/ tests ordered today include:see above  Disposition:   FU:  see above  Signed, Isaiah Serge, NP  05/21/2015 2:02 PM    [facility] [place], [place], [place], [place] Phone: [phone]; Fax: [phone]

## 2015-05-23 ENCOUNTER — Telehealth (HOSPITAL_COMMUNITY): Payer: Self-pay | Admitting: *Deleted

## 2015-05-23 ENCOUNTER — Telehealth: Payer: Self-pay | Admitting: Cardiovascular Disease

## 2015-05-23 NOTE — Telephone Encounter (Signed)
Pt is calling you back in regards to instructions for stress test on 5/17.  He will have his cell phone close by for when you return his call,   Thanks

## 2015-05-23 NOTE — Telephone Encounter (Signed)
Left message on voicemail in reference to upcoming appointment scheduled for 05/29/15. Phone number given for a call back so details instructions can be given. Dee Paden, Ranae [place]

## 2015-05-24 ENCOUNTER — Telehealth (HOSPITAL_COMMUNITY): Payer: Self-pay | Admitting: Radiology

## 2015-05-24 NOTE — Telephone Encounter (Signed)
Patient given detailed instructions per Myocardial Perfusion Study Information Sheet for the test on 05/29/2015 at 7:15. Patient notified to arrive 15 minutes early and that it is imperative to arrive on time for appointment to keep from having the test rescheduled.  If you need to cancel or reschedule your appointment, please call the office within 24 hours of your appointment. Failure to do so may result in a cancellation of your appointment, and a $50 no show fee. Patient verbalized understanding.EHK

## 2015-05-28 ENCOUNTER — Encounter: Payer: Self-pay | Admitting: Cardiovascular Disease

## 2015-05-29 ENCOUNTER — Ambulatory Visit (HOSPITAL_COMMUNITY): Payer: BC Managed Care – PPO | Attending: Cardiology

## 2015-05-29 DIAGNOSIS — R9439 Abnormal result of other cardiovascular function study: Secondary | ICD-10-CM | POA: Insufficient documentation

## 2015-05-29 DIAGNOSIS — I2510 Atherosclerotic heart disease of native coronary artery without angina pectoris: Secondary | ICD-10-CM | POA: Diagnosis not present

## 2015-05-29 DIAGNOSIS — I252 Old myocardial infarction: Secondary | ICD-10-CM | POA: Diagnosis not present

## 2015-05-29 DIAGNOSIS — R0609 Other forms of dyspnea: Secondary | ICD-10-CM | POA: Insufficient documentation

## 2015-05-29 DIAGNOSIS — R0789 Other chest pain: Secondary | ICD-10-CM | POA: Diagnosis not present

## 2015-05-29 LAB — MYOCARDIAL PERFUSION IMAGING
CHL CUP MPHR: 160 {beats}/min
CHL CUP NUCLEAR SDS: 5
CHL CUP RESTING HR STRESS: 63 {beats}/min
CSEPEW: 10.7 METS
CSEPHR: 98 %
CSEPPHR: 157 {beats}/min
Exercise duration (min): 9 min
Exercise duration (sec): 26 s
LHR: 0.27
LVDIAVOL: 87 mL (ref 62–150)
LVSYSVOL: 40 mL
SRS: 0
SSS: 5
TID: 1.14

## 2015-05-29 MED ORDER — TECHNETIUM TC 99M TETROFOSMIN IV KIT
32.8000 | PACK | Freq: Once | INTRAVENOUS | Status: AC | PRN
  Administered 2015-05-29: 32.8 via INTRAVENOUS
  Filled 2015-05-29: qty 33

## 2015-05-29 MED ORDER — TECHNETIUM TC 99M TETROFOSMIN IV KIT
10.1000 | PACK | Freq: Once | INTRAVENOUS | Status: AC | PRN
  Administered 2015-05-29: 10.1 via INTRAVENOUS
  Filled 2015-05-29: qty 10

## 2015-06-17 ENCOUNTER — Encounter: Payer: Self-pay | Admitting: Physician Assistant

## 2015-06-17 ENCOUNTER — Encounter: Payer: BC Managed Care – PPO | Admitting: Physician Assistant

## 2015-06-17 NOTE — Progress Notes (Signed)
This encounter was created in error - please disregard.

## 2015-06-17 NOTE — Progress Notes (Deleted)
Canceled.

## 2015-06-18 ENCOUNTER — Ambulatory Visit (INDEPENDENT_AMBULATORY_CARE_PROVIDER_SITE_OTHER): Payer: BC Managed Care – PPO | Admitting: Cardiovascular Disease

## 2015-06-18 ENCOUNTER — Encounter: Payer: Self-pay | Admitting: Cardiovascular Disease

## 2015-06-18 VITALS — BP 120/78 | HR 85 | Ht 72.0 in | Wt 160.8 lb

## 2015-06-18 DIAGNOSIS — I25110 Atherosclerotic heart disease of native coronary artery with unstable angina pectoris: Secondary | ICD-10-CM

## 2015-06-18 MED ORDER — ISOSORBIDE MONONITRATE ER 30 MG PO TB24: 30.0000 mg | ORAL_TABLET | Freq: Every day | ORAL | Status: DC

## 2015-06-18 NOTE — Patient Instructions (Addendum)
Medication Instructions:  Your physician has recommended you make the following change in your medication: Start Isosorbide mononitrate 30 mg by mouth daily.    Labwork: none  Testing/Procedures: none  Follow-Up: Your physician recommends that you schedule a follow-up appointment in:  6-8 weeks. --Scheduled for July 28,2017 at 9:15    Any Other Special Instructions Will Be Listed Below (If Applicable).     If you need a refill on your cardiac medications before your next appointment, please call your pharmacy.

## 2015-06-18 NOTE — Progress Notes (Signed)
Chief Complaint  Patient presents with  . Chest Pain    History of Present Illness: 60 yo male with history of CAD, OSA, hyperlipidemia here today for cardiac follow up. He had an inferior MI in July 2009 treated with a drug-eluting stent placed in the right coronary artery. In December 2009 he had a non-ST elevation MI treated with drug-eluting stents placed in the circumflex and LAD. He had a follow-up catheterization in February 2010 which showed nonobstructive disease. Exercise stress myoview June 2014 with no ischemia. I saw him in April 2017 and he was doing well but he was seen back here May 2017 by Cecilie Kicks, NP and he was having fatigue with low BP. Toprol reduced. Described chest burning while mowing the grass. Nuclear stress test 05/29/15 with no ischemia.   He is here today for follow up. He has continued to have burning when pushing the mower and with any moderate exertion. He also has pain under his left breast that radiates to the left chest wall and left axilla. This is worsened with exertion. He has also noticed excessive sweating.   Primary Care Physician: Dr. Valora Piccolo, [place], [place]).    Past Medical History  Diagnosis Date  . Coronary artery [place]     [place] [address] s/p DES to RCA. b. NSTEMI 12/2007 s/p DES to Cx and LAD.  Marland Kitchen Hyperlipidemia   . GERD (gastroesophageal reflux disease)   . Back pain     Past Surgical History  Procedure Laterality Date  . Tonsillectomy      Current Outpatient Prescriptions  Medication Sig Dispense Refill  . acetaminophen (TYLENOL) 500 MG tablet Take 500 mg by mouth every 4 (four) hours as needed for mild pain.    Marland Kitchen aspirin 81 MG tablet Take 81 mg by mouth daily.      Gretta Arab POWD Take 0.25 Doses by mouth as needed (general health).    . Cholecalciferol (VITAMIN D-3 PO) Take by mouth. 2000 iu  1 tab daily    . clopidogrel (PLAVIX) 75 MG tablet take 1 tablet by mouth once daily 30 tablet 11  . Lactobacillus (PROBIOTIC CHILDRENS  PO) Take 1 capsule by mouth as needed (after taking an  antibiotic).    . Melatonin 3 MG CAPS Take 1 capsule by mouth at bedtime.    . metoprolol succinate (TOPROL XL) 25 MG 24 hr tablet Take 0.5 tablets (12.5 mg total) by mouth daily. 90 tablet 3  . nitroGLYCERIN (NITROSTAT) 0.4 MG SL tablet Place 1 tablet (0.4 mg total) under the tongue every 5 (five) minutes as needed. 25 tablet 3  . pantoprazole (PROTONIX) 40 MG tablet Take 40 mg by mouth daily.    . rosuvastatin (CRESTOR) 20 MG tablet Take 20 mg by mouth daily.    . Saw Palmetto 450 MG CAPS Take 450 mg by mouth as needed (as a health supplement).     . isosorbide mononitrate (IMDUR) 30 MG 24 hr tablet Take 1 tablet (30 mg total) by mouth daily. 30 tablet 11   No current facility-administered medications for this visit.    Allergies  Allergen Reactions  . Amoxicillin     REACTION: unspecified REACTION: unspecified  . Citalopram Hydrobromide     Other reaction(s): DROWSINESS  . Clarithromycin     Other reaction(s): DIARRHEA  . Guaifenesin     Other reaction(s): HEADACHE  . Penicillins     Other reaction(s): SWELLING  . Streptomycin     Other  reaction(s): CHOKES    Social History   Social History  . Marital Status: Married    Spouse Name: N/A  . Number of Children: N/A  . Years of Education: N/A   Occupational History  . Not on file.   Social History Main Topics  . Smoking status: Never Smoker   . Smokeless tobacco: Not on file  . Alcohol Use: Not on file  . Drug Use: Not on file  . Sexual Activity: Not on file   Other Topics Concern  . Not on file   Social History Narrative    Family History  Problem Relation Age of Onset  . Coronary artery disease Mother   . Hypertension Father   . Heart attack Paternal Grandfather     Review of Systems:  As stated in the HPI and otherwise negative.   BP 120/78 mmHg  Pulse 85  Ht 6' (1.829 m)  Wt 160 lb 12.8 oz (72.938 kg)  BMI 21.80 kg/m2  SpO2 98%  Physical  Examination: General: Well developed, well nourished, NAD HEENT: OP clear, mucus membranes moist SKIN: warm, dry. No rashes. Neuro: No focal deficits Musculoskeletal: Muscle strength 5/5 all ext Psychiatric: Mood and affect normal Neck: No JVD, no carotid bruits, no thyromegaly, no lymphadenopathy. Lungs:Clear bilaterally, no wheezes, rhonci, crackles Cardiovascular: Regular rate and rhythm. No murmurs, gallops or rubs. Abdomen:Soft. Bowel sounds present. Non-tender.  Extremities: No lower extremity edema. Pulses are 2 + in the bilateral DP/PT.  Echo 11/20/11: Left ventricle: The cavity size was normal. Wall thickness was normal. Systolic function was normal. The estimated ejection fraction was in the range of 55% to 65%. Wall motion was normal; there were no regional wall motion abnormalities. Left ventricular diastolic function parameters were normal. - Atrial septum: No defect or patent foramen ovale was Identified.  EKG:  EKG is not ordered today. The ekg ordered today demonstrates   Recent Labs: 04/18/2015: BUN 14; Creat 1.01; Hemoglobin 14.5; Platelets 230; Potassium 3.8; Sodium 139; TSH 1.83   Lipid Panel    Component Value Date/Time   CHOL 167 04/18/2014 1613   TRIG 94.0 04/18/2014 1613   HDL 46.00 04/18/2014 1613   CHOLHDL 4 04/18/2014 1613   VLDL 18.8 04/18/2014 1613   LDLCALC 102* 04/18/2014 1613   LDLDIRECT 146.2 11/20/2009 0907     Wt Readings from Last 3 Encounters:  06/18/15 160 lb 12.8 oz (72.938 kg)  05/21/15 157 lb 6.4 oz (71.396 kg)  04/18/15 159 lb (72.122 kg)     Other studies Reviewed: Additional studies/ records that were reviewed today include:  Review of the above records demonstrates:   Assessment and Plan:   1. CAD with unstable angina: He has had chest pain with exertion. This is concerning for unstable angina. Last cath 2010. He has stents in the RCA, Circumflex and LAD. Nuclear stress test May 2017 with no ischemia but cannot exclude  balanced ischemia given his known three vessel CAD.  He is on good medical therapy. We have discussed a cardiac catheterization but he wishes to think about this. Will start Imdur 30 mg daily. He will call back to arrange cath. He will go to the ED if symptoms worsen.   2. Hyperlipidemia: He is on a statin. Lipids followed in primary care.   Current medicines are reviewed at length with the patient today.  The patient does not have concerns regarding medicines.  The following changes have been made:  no change  Labs/ tests ordered today include:  No orders of the defined types were placed in this encounter.    Disposition:   FU with me in 6  months  Signed, Lauree Chandler, MD 06/18/2015 2:48 PM    [facility] [address], [place], [place]  [address] Phone: [phone]; Fax: [phone]

## 2015-08-09 ENCOUNTER — Encounter: Payer: Self-pay | Admitting: Cardiovascular Disease

## 2015-08-09 ENCOUNTER — Ambulatory Visit (INDEPENDENT_AMBULATORY_CARE_PROVIDER_SITE_OTHER): Payer: BC Managed Care – PPO | Admitting: Cardiovascular Disease

## 2015-08-09 VITALS — BP 120/76 | HR 81 | Ht 72.0 in | Wt 159.0 lb

## 2015-08-09 DIAGNOSIS — E785 Hyperlipidemia, unspecified: Secondary | ICD-10-CM

## 2015-08-09 DIAGNOSIS — I2510 Atherosclerotic heart disease of native coronary artery without angina pectoris: Secondary | ICD-10-CM

## 2015-08-09 DIAGNOSIS — R5383 Other fatigue: Secondary | ICD-10-CM

## 2015-08-09 NOTE — Progress Notes (Signed)
No chief complaint on file.   History of Present Illness: 60 yo male with history of CAD, OSA, hyperlipidemia here today for cardiac follow up. He had an inferior MI in July 2009 treated with a drug-eluting stent placed in the right coronary artery. In December 2009 he had a non-ST elevation MI treated with drug-eluting stents placed in the circumflex and LAD. He had a follow-up catheterization in February 2010 which showed nonobstructive disease. Exercise stress myoview June 2014 with no ischemia. I saw him in April 2017 and he was doing well but he was seen back here May 2017 by Cecilie Kicks, NP and he was having fatigue with low BP. Toprol reduced. Described chest burning while mowing the grass. Nuclear stress test 05/29/15 with no ischemia. I saw him in June 2017 and he c/o chest pain with moderate exertion. Imdur started. I offered a cardiac cath but he wanted to try the Imdur first.   He is here today for follow up. He has continued to have burning when pushing the mower and with any moderate exertion. He also has pain under his left breast that radiates to the left chest wall and left axilla. This is worsened with exertion. He has also noticed excessive sweating.   Primary Care Physician: Dr. Valora Piccolo, [place], [place]).    Past Medical History:  Diagnosis Date  . Back pain   . Coronary artery disease    [place] [address] s/p DES to RCA. b. NSTEMI 12/2007 s/p DES to Cx and LAD.  Marland Kitchen GERD (gastroesophageal reflux disease)   . Hyperlipidemia     Past Surgical History:  Procedure Laterality Date  . TONSILLECTOMY      Current Outpatient Prescriptions  Medication Sig Dispense Refill  . acetaminophen (TYLENOL) 500 MG tablet Take 500 mg by mouth every 4 (four) hours as needed for mild pain.    Marland Kitchen aspirin 81 MG tablet Take 81 mg by mouth daily.      . Cholecalciferol (VITAMIN D-3 PO) Take by mouth. 2000 iu  1 tab daily    . clopidogrel (PLAVIX) 75 MG tablet take 1 tablet by mouth once daily 30  tablet 11  . isosorbide mononitrate (IMDUR) 30 MG 24 hr tablet Take 1 tablet (30 mg total) by mouth daily. 30 tablet 11  . Melatonin 3 MG CAPS Take 1 capsule by mouth at bedtime.    . metoprolol succinate (TOPROL XL) 25 MG 24 hr tablet Take 0.5 tablets (12.5 mg total) by mouth daily. 90 tablet 3  . nitroGLYCERIN (NITROSTAT) 0.4 MG SL tablet Place 1 tablet (0.4 mg total) under the tongue every 5 (five) minutes as needed. 25 tablet 3  . OVER THE COUNTER MEDICATION Neem Leaf--healthy skin and digestion    . pantoprazole (PROTONIX) 40 MG tablet Take 40 mg by mouth daily.    . rosuvastatin (CRESTOR) 20 MG tablet Take 20 mg by mouth daily.    . Saw Palmetto 450 MG CAPS Take 450 mg by mouth as needed (as a health supplement).      No current facility-administered medications for this visit.     Allergies  Allergen Reactions  . Amoxicillin     REACTION: unspecified REACTION: unspecified  . Citalopram Hydrobromide     Other reaction(s): DROWSINESS Other reaction(s): DROWSINESS  . Clarithromycin     Other reaction(s): DIARRHEA Other reaction(s): DIARRHEA  . Guaifenesin     Other reaction(s): HEADACHE Other reaction(s): HEADACHE  . Penicillins     Other  reaction(s): SWELLING Other reaction(s): SWELLING  . Streptomycin     Other reaction(s): CHOKES Other reaction(s): CHOKES    Social History   Social History  . Marital status: Married    Spouse name: N/A  . Number of children: N/A  . Years of education: N/A   Occupational History  . Not on file.   Social History Main Topics  . Smoking status: Never Smoker  . Smokeless tobacco: Never Used  . Alcohol use No  . Drug use: No  . Sexual activity: Not on file   Other Topics Concern  . Not on file   Social History Narrative  . No narrative on file    Family History  Problem Relation Age of Onset  . Coronary artery disease Mother   . Hypertension Father   . Heart attack Paternal Grandfather     Review of Systems:  As  stated in the HPI and otherwise negative.   BP 120/76   Pulse 81   Ht 6' (1.829 m)   Wt 159 lb (72.1 kg)   SpO2 98%   BMI 21.56 kg/m   Physical Examination: General: Well developed, well nourished, NAD  HEENT: OP clear, mucus membranes moist  SKIN: warm, dry. No rashes. Neuro: No focal deficits  Musculoskeletal: Muscle strength 5/5 all ext  Psychiatric: Mood and affect normal  Neck: No JVD, no carotid bruits, no thyromegaly, no lymphadenopathy.  Lungs:Clear bilaterally, no wheezes, rhonci, crackles Cardiovascular: Regular rate and rhythm. No murmurs, gallops or rubs. Abdomen:Soft. Bowel sounds present. Non-tender.  Extremities: No lower extremity edema. Pulses are 2 + in the bilateral DP/PT.  Echo 11/20/11: Left ventricle: The cavity size was normal. Wall thickness was normal. Systolic function was normal. The estimated ejection fraction was in the range of 55% to 65%. Wall motion was normal; there were no regional wall motion abnormalities. Left ventricular diastolic function parameters were normal. - Atrial septum: No defect or patent foramen ovale was Identified.  Stress Myoview May 2017:  Nuclear stress EF: 54%. No wall motion abnormalities  There was no ST segment deviation noted during stress.  Defect 1: There is a small defect of mild severity present in the basal inferior location. Likely diaphragmatic attenuation artifact  This is a low risk study. No significant ischemia identified.  EKG:  EKG is not ordered today. The ekg ordered today demonstrates   Recent Labs: 04/18/2015: BUN 14; Creat 1.01; Hemoglobin 14.5; Platelets 230; Potassium 3.8; Sodium 139; TSH 1.83   Lipid Panel    Component Value Date/Time   CHOL 167 04/18/2014 1613   TRIG 94.0 04/18/2014 1613   HDL 46.00 04/18/2014 1613   CHOLHDL 4 04/18/2014 1613   VLDL 18.8 04/18/2014 1613   LDLCALC 102 (H) 04/18/2014 1613   LDLDIRECT 146.2 11/20/2009 0907     Wt Readings from Last 3 Encounters:    08/09/15 159 lb (72.1 kg)  06/18/15 160 lb 12.8 oz (72.9 kg)  05/21/15 157 lb 6.4 oz (71.4 kg)     Other studies Reviewed: Additional studies/ records that were reviewed today include:  Review of the above records demonstrates:   Assessment and Plan:   1. CAD with unstable angina: He has had no chest pain with exertion. He did not start Imdur. Last cath 2010. He has stents in the RCA, Circumflex and LAD. Nuclear stress test May 2017 with no ischemia. He is on good medical therapy. If he has recurrence of chest pain, will try Imdur.    2. Hyperlipidemia:  He is on a statin. Lipids followed in primary care.   3. Daytime fatigue: He has remote history of OSA but no longer used CPAP. I have offered a sleep study if he continues to have issues.   Current medicines are reviewed at length with the patient today.  The patient does not have concerns regarding medicines.  The following changes have been made:  no change  Labs/ tests ordered today include:   No orders of the defined types were placed in this encounter.   Disposition:   FU with me in 6  months  Signed, Lauree Chandler, MD 08/09/2015 10:01 AM    [facility] [place], [place], [place]  [address] Phone: [phone]; Fax: [phone]

## 2015-08-09 NOTE — Patient Instructions (Signed)

## 2015-10-04 ENCOUNTER — Other Ambulatory Visit: Payer: Self-pay | Admitting: Cardiovascular Disease

## 2015-12-18 ENCOUNTER — Telehealth: Payer: Self-pay | Admitting: Cardiovascular Disease

## 2015-12-18 NOTE — Telephone Encounter (Signed)
I spoke with pt. He is calling to schedule 6 month appt for end of January. I scheduled him to see Dr. Angelena Form on 02/10/16 at 11:00

## 2016-02-10 ENCOUNTER — Ambulatory Visit (INDEPENDENT_AMBULATORY_CARE_PROVIDER_SITE_OTHER): Payer: BC Managed Care – PPO | Admitting: Cardiovascular Disease

## 2016-02-10 ENCOUNTER — Encounter: Payer: Self-pay | Admitting: Cardiovascular Disease

## 2016-02-10 VITALS — BP 120/76 | HR 83 | Ht 73.0 in | Wt 158.6 lb

## 2016-02-10 DIAGNOSIS — E7800 Pure hypercholesterolemia, unspecified: Secondary | ICD-10-CM

## 2016-02-10 DIAGNOSIS — I2510 Atherosclerotic heart disease of native coronary artery without angina pectoris: Secondary | ICD-10-CM | POA: Diagnosis not present

## 2016-02-10 NOTE — Progress Notes (Signed)
Chief Complaint  Patient presents with  . Follow-up    6 months    History of Present Illness: 61 yo male with history of CAD, OSA, hyperlipidemia here today for cardiac follow up. 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 In December 2009 he had a non-ST elevation MI treated with drug-eluting stents in the circumflex and LAD. He had a follow-up catheterization in February 2010 which showed nonobstructive disease. Exercise stress myoview June 2014 with no ischemia. I saw him in April 2017 and he was doing well but he was seen back here May 2017 by Cecilie Kicks, NP and he was having fatigue with low BP. Toprol reduced. Described chest burning while mowing the grass. Nuclear stress test 05/29/15 with no ischemia. I saw him in June 2017 and he c/o chest pain with moderate exertion. Imdur started. I offered a cardiac cath but he wanted to try the Imdur first. He did not start the Imdur.   He is here today for follow up.  He is feeling well. Rare mild chest pain. He is exercising. No dyspnea. No palpitations.   Primary Care Physician: Lilian Coma., MD   Past Medical History:  Diagnosis Date  . Back pain   . Coronary [place]    [place] [address] s/p DES to RCA. b. NSTEMI 12/2007 s/p DES to Cx and LAD.  Marland Kitchen GERD (gastroesophageal reflux disease)   . Hyperlipidemia     Past Surgical History:  Procedure Laterality Date  . TONSILLECTOMY      Current Outpatient Prescriptions  Medication Sig Dispense Refill  . acetaminophen (TYLENOL) 500 MG tablet Take 500 mg by mouth every 4 (four) hours as needed for mild pain.    Marland Kitchen aspirin 81 MG tablet Take 81 mg by mouth daily.      . Cholecalciferol (VITAMIN D3) 400 units CAPS Take 400 Units by mouth daily.    . clopidogrel (PLAVIX) 75 MG tablet take 1 tablet by mouth once daily 30 tablet 11  . isosorbide mononitrate (IMDUR) 30 MG 24 hr tablet Take 1 tablet (30 mg total) by mouth daily. 30  tablet 11  . metoprolol succinate (TOPROL XL) 25 MG 24 hr tablet Take 0.5 tablets (12.5 mg total) by mouth daily. 90 tablet 3  . nitroGLYCERIN (NITROSTAT) 0.4 MG SL tablet Place 0.4 mg under the tongue every 5 (five) minutes as needed for chest pain (Call 911 at 3rd doses within 15 minutes).    Marland Kitchen OVER THE COUNTER MEDICATION Neem Leaf--healthy skin and digestion    . OVER THE COUNTER MEDICATION Take 1 capsule by mouth daily. FOLIGEN    . pantoprazole (PROTONIX) 40 MG tablet Take 40 mg by mouth daily.    . rosuvastatin (CRESTOR) 20 MG tablet Take 20 mg by mouth daily.    . Saw Palmetto 450 MG CAPS Take 450 mg by mouth as needed (as a health supplement).      No current facility-administered medications for this visit.     Allergies  Allergen Reactions  . Amoxicillin     REACTION: unspecified REACTION: unspecified  . Citalopram Hydrobromide     Other reaction(s): DROWSINESS Other reaction(s): DROWSINESS  . Clarithromycin     Other reaction(s): DIARRHEA Other reaction(s): DIARRHEA  . Guaifenesin     Other reaction(s): HEADACHE Other reaction(s): HEADACHE  . Penicillins     Other reaction(s): SWELLING Other reaction(s): SWELLING  . Streptomycin  Other reaction(s): CHOKES Other reaction(s): CHOKES    Social History   Social History  . Marital status: Married    Spouse name: N/A  . Number of children: N/A  . Years of education: N/A   Occupational History  . Not on file.   Social History Main Topics  . Smoking status: Never Smoker  . Smokeless tobacco: Never Used  . Alcohol use No  . Drug use: No  . Sexual activity: Not on file   Other Topics Concern  . Not on file   Social History Narrative  . No narrative on file    Family History  Problem Relation Age of Onset  . Coronary artery disease Mother   . Hypertension Father   . Heart attack Paternal Grandfather     Review of Systems:  As stated in the HPI and otherwise negative.   BP 120/76 (BP Location:  Left Arm)   Pulse 83   Ht 6\' 1"  (1.854 m)   Wt 158 lb 9.6 oz (71.9 kg)   BMI 20.92 kg/m   Physical Examination: General: Well developed, well nourished, NAD  HEENT: OP clear, mucus membranes moist  SKIN: warm, dry. No rashes. Neuro: No focal deficits  Musculoskeletal: Muscle strength 5/5 all ext  Psychiatric: Mood and affect normal  Neck: No JVD, no carotid bruits, no thyromegaly, no lymphadenopathy.  Lungs:Clear bilaterally, no wheezes, rhonci, crackles Cardiovascular: Regular rate and rhythm. No murmurs, gallops or rubs. Abdomen:Soft. Bowel sounds present. Non-tender.  Extremities: No lower extremity edema. Pulses are 2 + in the bilateral DP/PT.  Echo 11/20/11: Left ventricle: The cavity size was normal. Wall thickness was normal. Systolic function was normal. The estimated ejection fraction was in the range of 55% to 65%. Wall motion was normal; there were no regional wall motion abnormalities. Left ventricular diastolic function parameters were normal. - Atrial septum: No defect or patent foramen ovale was Identified.  Stress Myoview May 2017:  Nuclear stress EF: 54%. No wall motion abnormalities  There was no ST segment deviation noted during stress.  Defect 1: There is a small defect of mild severity present in the basal inferior location. Likely diaphragmatic attenuation artifact  This is a low risk study. No significant ischemia identified.  EKG:  EKG is ordered today. The ekg ordered today demonstrates NSR, rate 83 bpm.   Recent Labs: 04/18/2015: BUN 14; Creat 1.01; Hemoglobin 14.5; Platelets 230; Potassium 3.8; Sodium 139; TSH 1.83   Lipid Panel    Component Value Date/Time   CHOL 167 04/18/2014 1613   TRIG 94.0 04/18/2014 1613   HDL 46.00 04/18/2014 1613   CHOLHDL 4 04/18/2014 1613   VLDL 18.8 04/18/2014 1613   LDLCALC 102 (H) 04/18/2014 1613   LDLDIRECT 146.2 11/20/2009 0907     Wt Readings from Last 3 Encounters:  02/10/16 158 lb 9.6 oz (71.9 kg)    08/09/15 159 lb (72.1 kg)  06/18/15 160 lb 12.8 oz (72.9 kg)     Other studies Reviewed: Additional studies/ records that were reviewed today include:  Review of the above records demonstrates:   Assessment and Plan:   1. CAD with unstable angina: He has had no chest pain with exertion. He did not start Imdur. Last cath 2010. He has stents in the RCA, Circumflex and LAD. Nuclear stress test May 2017 with no ischemia. He is on good medical therapy. Continue ASA, Plavix, Toprol, Crestor.   2. Hyperlipidemia: He is on a statin. Lipids followed in primary care. Goal LDL  less than 70.   3. Daytime fatigue: He has remote history of OSA but no longer uses CPAP. Daytime fatigue has improved. He does not desire to have a sleep study.   Current medicines are reviewed at length with the patient today.  The patient does not have concerns regarding medicines.  The following changes have been made:  no change  Labs/ tests ordered today include:   Orders Placed This Encounter  Procedures  . EKG 12-Lead    Disposition:   FU with me in 6  months  Signed, Lauree Chandler, MD 02/10/2016 11:38 AM    [facility] [address], [place], [place]  [address] Phone: [phone]; Fax: [phone]

## 2016-02-10 NOTE — Patient Instructions (Signed)
Medication Instructions:  Your physician recommends that you continue on your current medications as directed. Please refer to the Current Medication list given to you today.   Labwork: none  Testing/Procedures: non  Follow-Up: Your physician recommends that you schedule a follow-up appointment in: 6 months. Please call our office in about 3 months to schedule this appointment.     Any Other Special Instructions Will Be Listed Below (If Applicable).     If you need a refill on your cardiac medications before your next appointment, please call your pharmacy.

## 2016-04-17 ENCOUNTER — Telehealth: Payer: Self-pay | Admitting: Cardiovascular Disease

## 2016-04-17 NOTE — Telephone Encounter (Signed)
Patient will be leaving for [place] in about 4-5 days to be with his father who is ill.  He may be caring for him and transporting him to Dr. Kendrick Fries.  Advised patient to stay out of the sun/heat during the hottest times of the day.  Try to stay cool inside.  Do activities as tolerated.  Drink plenty of fluids.  He has the prescription for imdur.  Has not used it but will take it with him.   He gets fleeting chest tightness about once every 2-4 days.  Pt is aware I will forward to Dr. Angelena Form for any further recommendations and will call him back if needed.  He is appreciative for the information provided.

## 2016-04-17 NOTE — Telephone Encounter (Signed)
New message     Pt will be traveling to [place] next week and will be gone for 3 weeks.  It is 99 degrees there.  Pt want to know what precautions, limitations, do's and don't's he should take healthwise. Please call

## 2016-04-20 NOTE — Telephone Encounter (Signed)
No further recs. Thanks, chris

## 2016-05-14 DIAGNOSIS — R7303 Prediabetes: Secondary | ICD-10-CM | POA: Insufficient documentation

## 2016-05-31 ENCOUNTER — Other Ambulatory Visit: Payer: Self-pay | Admitting: Cardiology

## 2016-07-28 ENCOUNTER — Encounter: Payer: Self-pay | Admitting: *Deleted

## 2016-07-28 IMAGING — NM NM MISC PROCEDURE
3 series · 18 of 18 positions shown · non-contrast
Comparison: none

[Series 1: stress-sum-em-mc_(id)_sa · 6.4mm · 6.40mm/px · 6 of 64 frames shown]
[frame 6/64]
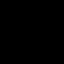
[frame 16/64]
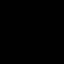
[frame 27/64]
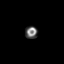
[frame 38/64]
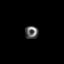
[frame 48/64]
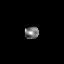
[frame 59/64]
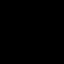

[Series 1: rest_(id)_sa · 6.4mm · 6.40mm/px · 6 of 64 frames shown]
[frame 6/64]
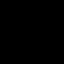
[frame 16/64]
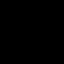
[frame 27/64]
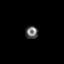
[frame 38/64]
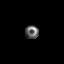
[frame 48/64]
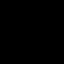
[frame 59/64]
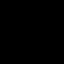

[Series 1: stress-gsp-mc_(id)_sa · 6.4mm · 6.40mm/px · 6 of 512 frames shown]
[frame 43/512]
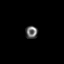
[frame 128/512]
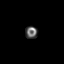
[frame 214/512]
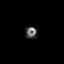
[frame 299/512]
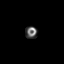
[frame 384/512]
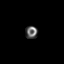
[frame 470/512]
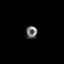

[18 of 18 positions shown; findings below may reference images not displayed]

Canned report from images found in remote index.

Refer to host system for actual result text.

## 2016-08-12 ENCOUNTER — Ambulatory Visit (INDEPENDENT_AMBULATORY_CARE_PROVIDER_SITE_OTHER): Payer: BC Managed Care – PPO | Admitting: Cardiovascular Disease

## 2016-08-12 ENCOUNTER — Encounter (INDEPENDENT_AMBULATORY_CARE_PROVIDER_SITE_OTHER): Payer: Self-pay

## 2016-08-12 ENCOUNTER — Encounter: Payer: Self-pay | Admitting: Cardiovascular Disease

## 2016-08-12 VITALS — BP 102/60 | HR 84 | Ht 73.0 in | Wt 158.0 lb

## 2016-08-12 DIAGNOSIS — I2510 Atherosclerotic heart disease of native coronary artery without angina pectoris: Secondary | ICD-10-CM

## 2016-08-12 DIAGNOSIS — E7800 Pure hypercholesterolemia, unspecified: Secondary | ICD-10-CM

## 2016-08-12 MED ORDER — METOPROLOL SUCCINATE ER 25 MG PO TB24: ORAL_TABLET | ORAL | 3 refills | Status: DC

## 2016-08-12 MED ORDER — ROSUVASTATIN CALCIUM 40 MG PO TABS: 40.0000 mg | ORAL_TABLET | Freq: Every day | ORAL | 3 refills | Status: DC

## 2016-08-12 NOTE — Patient Instructions (Signed)

## 2016-08-12 NOTE — Progress Notes (Signed)
Chief Complaint  Patient presents with  . Follow-up    History of Present Illness: 61 yo male with history of CAD, OSA, hyperlipidemia here today for cardiac follow up. 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 In December 2009 he had a non-ST elevation MI treated with drug-eluting stents in the circumflex and LAD. He had a follow-up catheterization in February 2010 which showed nonobstructive disease. He was seen in our office May 2017 by Cecilie Kicks, NP and he was having fatigue with low BP. Toprol reduced. Described chest burning while mowing the grass. Nuclear stress test 05/29/15 with no ischemia. I saw him in June 2017 and he c/o chest pain with moderate exertion. Imdur was written but he did not start. I offered a cardiac cath but he wanted to try the Imdur first.   He is here today for follow up. The patient denies any chest pain, dyspnea, palpitations, lower extremity edema, orthopnea, PND, dizziness, near syncope or syncope. He does report easy fatigue. Overall feeling well.   Primary Care Physician: Lilian Coma., MD   Past Medical History:  Diagnosis Date  . Back pain   . Coronary artery [place]    [place] [address] s/p DES to RCA. b. NSTEMI 12/2007 s/p DES to Cx and LAD.  Marland Kitchen GERD (gastroesophageal reflux disease)   . Hyperlipidemia     Past Surgical History:  Procedure Laterality Date  . TONSILLECTOMY      Current Outpatient Prescriptions  Medication Sig Dispense Refill  . acetaminophen (TYLENOL) 500 MG tablet Take 500 mg by mouth every 4 (four) hours as needed for mild pain.    Marland Kitchen aspirin 81 MG tablet Take 81 mg by mouth daily.      . Cholecalciferol (VITAMIN D3) 400 units CAPS Take 400 Units by mouth daily.    . clopidogrel (PLAVIX) 75 MG tablet take 1 tablet by mouth once daily 30 tablet 11  . metoprolol succinate (TOPROL-XL) 25 MG 24 hr tablet take HALF tablet by mouth once daily 45 tablet 3  . nitroGLYCERIN  (NITROSTAT) 0.4 MG SL tablet Place 0.4 mg under the tongue every 5 (five) minutes as needed for chest pain (Call 911 at 3rd doses within 15 minutes).    Marland Kitchen OVER THE COUNTER MEDICATION Neem Leaf--healthy skin and digestion    . OVER THE COUNTER MEDICATION Take 1 capsule by mouth daily. FOLIGEN    . pantoprazole (PROTONIX) 40 MG tablet Take 40 mg by mouth daily.    . rosuvastatin (CRESTOR) 40 MG tablet Take 1 tablet (40 mg total) by mouth daily. 90 tablet 3  . Saw Palmetto 450 MG CAPS Take 450 mg by mouth as needed (as a health supplement).     . Turmeric Curcumin 500 MG CAPS Take 1 tablet by mouth daily.     No current facility-administered medications for this visit.     Allergies  Allergen Reactions  . Amoxicillin     REACTION: unspecified REACTION: unspecified  . Citalopram Hydrobromide     Other reaction(s): DROWSINESS Other reaction(s): DROWSINESS  . Clarithromycin     Other reaction(s): DIARRHEA Other reaction(s): DIARRHEA  . Guaifenesin     Other reaction(s): HEADACHE Other reaction(s): HEADACHE  . Penicillins     Other reaction(s): SWELLING Other reaction(s): SWELLING  . Streptomycin     Other reaction(s): CHOKES Other reaction(s): CHOKES    Social History   Social History  . Marital  status: Married    Spouse name: N/A  . Number of children: N/A  . Years of education: N/A   Occupational History  . Not on file.   Social History Main Topics  . Smoking status: Never Smoker  . Smokeless tobacco: Never Used  . Alcohol use No  . Drug use: No  . Sexual activity: Not on file   Other Topics Concern  . Not on file   Social History Narrative  . No narrative on file    Family History  Problem Relation Age of Onset  . Coronary artery disease Mother   . Hypertension Father   . Heart attack Paternal Grandfather     Review of Systems:  As stated in the HPI and otherwise negative.   BP 102/60   Pulse 84   Ht 6\' 1"  (1.854 m)   Wt 158 lb (71.7 kg)   SpO2 98%    BMI 20.85 kg/m   Physical Examination: General: Well developed, well nourished, NAD  HEENT: OP clear, mucus membranes moist  SKIN: warm, dry. No rashes. Neuro: No focal deficits  Musculoskeletal: Muscle strength 5/5 all ext  Psychiatric: Mood and affect normal  Neck: No JVD, no carotid bruits, no thyromegaly, no lymphadenopathy.  Lungs:Clear bilaterally, no wheezes, rhonci, crackles Cardiovascular: Regular rate and rhythm. No murmurs, gallops or rubs. Abdomen:Soft. Bowel sounds present. Non-tender.  Extremities: No lower extremity edema. Pulses are 2 + in the bilateral DP/PT.  Echo 11/20/11: Left ventricle: The cavity size was normal. Wall thickness was normal. Systolic function was normal. The estimated ejection fraction was in the range of 55% to 65%. Wall motion was normal; there were no regional wall motion abnormalities. Left ventricular diastolic function parameters were normal. - Atrial septum: No defect or patent foramen ovale was Identified.  Stress Myoview May 2017:  Nuclear stress EF: 54%. No wall motion abnormalities  There was no ST segment deviation noted during stress.  Defect 1: There is a small defect of mild severity present in the basal inferior location. Likely diaphragmatic attenuation artifact  This is a low risk study. No significant ischemia identified.  EKG:  EKG is ordered today. The ekg ordered today demonstrates   Recent Labs: No results found for requested labs within last 8760 hours.   Lipid Panel Followed in primary care.    Wt Readings from Last 3 Encounters:  08/12/16 158 lb (71.7 kg)  02/10/16 158 lb 9.6 oz (71.9 kg)  08/09/15 159 lb (72.1 kg)     Other studies Reviewed: Additional studies/ records that were reviewed today include:  Review of the above records demonstrates:   Assessment and Plan:   1. CAD without angina: He has no change in chronic chest pain which has been mostly controlled. Last cath in 2010. Nuclear  stress test in May 2017. Will continue ASA, Plavix, Toprol and Crestor.    2. Hyperlipidemia: His lipids are followed in primary care. Last LDL in primary care May 2018 was 220. He had been off of Crestor. He is now back on Crestor 40 mg daily.     Current medicines are reviewed at length with the patient today.  The patient does not have concerns regarding medicines.  The following changes have been made:  no change  Labs/ tests ordered today include:   No orders of the defined types were placed in this encounter.   Disposition:   FU with me in 12  months  Signed, Lauree Chandler, MD 08/12/2016 10:46 AM  [facility] [address], [place], [place]  [address] Phone: [phone]; Fax: [phone]

## 2017-06-14 ENCOUNTER — Telehealth: Payer: Self-pay | Admitting: Cardiovascular Disease

## 2017-06-14 NOTE — Telephone Encounter (Signed)
New Message     Patient is calling and requesting to speak with nurse. He did not want to provide any information other than he wants to touch bases about his health situation. Please call.

## 2017-06-14 NOTE — Telephone Encounter (Signed)
I spoke with pt. He reports he has been feeling sluggish and with low energy recently.  BP running low at times also--95/58.  Heart rate is 70-80.  Pt stopped Toprol over the weekend and is feeling much better. Energy level has improved. He wanted to make Dr. Angelena Form aware.  Pt is monitoring heart rate and blood pressure.

## 2017-06-18 NOTE — Telephone Encounter (Signed)
Thanks. Noted. Gerald Stabs

## 2017-09-20 ENCOUNTER — Encounter: Payer: Self-pay | Admitting: Cardiovascular Disease

## 2017-09-28 ENCOUNTER — Telehealth: Payer: Self-pay | Admitting: Cardiovascular Disease

## 2017-09-28 NOTE — Telephone Encounter (Signed)
I thought he typically had his labs followed in primary care. If he is no longer doing this, we can arrange a CMET and lipids prior to his appt with me. Thanks, chris

## 2017-09-28 NOTE — Telephone Encounter (Signed)
New Message:    Patient is requesting to have labs done before appt 10/04/17

## 2017-09-28 NOTE — Telephone Encounter (Signed)
Pt is requesting to have labs checked prior to his scheduled OV with Dr Angelena Form on 9/23.  Pt would like them done prior to the OV, so he can go over the results at his 9/23 appt, with Dr Angelena Form.  Informed the pt that Dr Angelena Form and Fraser Din RN are out of the office today, but I will route this request to them for further review and recommendation, and someone from the office will follow-up with him shortly thereafter.  Pt verbalized understanding and agrees with this plan.

## 2017-09-29 NOTE — Telephone Encounter (Signed)
Lipid profile was checked by primary care in May 2019 and is available in care everywhere.  LDL was 92. Pt has not had any changes to cholesterol medication since this lab work was checked. He has been trying to follow a mediterranean diet more closely.  I told him lab results from primary care were available and we did not need to recheck prior to office visit with Dr. Angelena Form.

## 2017-10-04 ENCOUNTER — Ambulatory Visit: Payer: BC Managed Care – PPO | Admitting: Cardiovascular Disease

## 2017-10-04 ENCOUNTER — Encounter (INDEPENDENT_AMBULATORY_CARE_PROVIDER_SITE_OTHER): Payer: Self-pay

## 2017-10-04 ENCOUNTER — Encounter: Payer: Self-pay | Admitting: Cardiovascular Disease

## 2017-10-04 ENCOUNTER — Encounter

## 2017-10-04 VITALS — BP 140/86 | HR 86 | Ht 73.0 in | Wt 151.4 lb

## 2017-10-04 DIAGNOSIS — E7800 Pure hypercholesterolemia, unspecified: Secondary | ICD-10-CM | POA: Diagnosis not present

## 2017-10-04 DIAGNOSIS — I2510 Atherosclerotic heart disease of native coronary artery without angina pectoris: Secondary | ICD-10-CM

## 2017-10-04 NOTE — Progress Notes (Signed)
Chief Complaint  Patient presents with  . Follow-up    CAD   History of Present Illness: 62 yo male with history of CAD, OSA, hyperlipidemia here today for cardiac follow up. 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 In December 2009 he had a non-ST elevation MI treated with drug-eluting stents in the circumflex and LAD. He had a follow-up catheterization in February 2010 which showed nonobstructive disease. He was seen in our office May 2017 and he was having fatigue with low BP and chest burning while mowing the grass. Nuclear stress test 05/29/15 with no ischemia. Toprol was reduced.    He is here today for follow up. The patient denies any chest pain, dyspnea, palpitations, lower extremity edema, orthopnea, PND, dizziness, near syncope or syncope. He had BP 90s at home. Stopped Toprol and his BP improved and his fatigue improved. He brings in a BP log from home and most BP is now in the 110-120 range.   Primary Care Physician: System, Pcp Not In  Past Medical History:  Diagnosis Date  . Back pain   . Coronary [place]    [place] [address] s/p DES to RCA. b. NSTEMI 12/2007 s/p DES to Cx and LAD.  Marland Kitchen GERD (gastroesophageal reflux disease)   . Hyperlipidemia     Past Surgical History:  Procedure Laterality Date  . TONSILLECTOMY      Current Outpatient Medications  Medication Sig Dispense Refill  . acetaminophen (TYLENOL) 500 MG tablet Take 500 mg by mouth every 4 (four) hours as needed for mild pain.    . Cholecalciferol (VITAMIN D3) 400 units CAPS Take 2,000 Units by mouth daily.     . clopidogrel (PLAVIX) 75 MG tablet take 1 tablet by mouth once daily 30 tablet 11  . nitroGLYCERIN (NITROSTAT) 0.4 MG SL tablet Place 0.4 mg under the tongue every 5 (five) minutes as needed for chest pain (Call 911 at 3rd doses within 15 minutes).    Marland Kitchen OVER THE COUNTER MEDICATION Neem Leaf--healthy skin and digestion    . OVER THE COUNTER  MEDICATION Take 1 capsule by mouth daily. FOLIGEN    . pantoprazole (PROTONIX) 40 MG tablet Take 40 mg by mouth daily.    . rosuvastatin (CRESTOR) 40 MG tablet Take 1 tablet (40 mg total) by mouth daily. 90 tablet 3  . Saw Palmetto 450 MG CAPS Take 450 mg by mouth as needed (as a health supplement).     . Turmeric Curcumin 500 MG CAPS Take 1 tablet by mouth daily.     No current facility-administered medications for this visit.     Allergies  Allergen Reactions  . Amoxicillin     REACTION: unspecified REACTION: unspecified  . Citalopram Hydrobromide     Other reaction(s): DROWSINESS Other reaction(s): DROWSINESS  . Clarithromycin     Other reaction(s): DIARRHEA Other reaction(s): DIARRHEA  . Guaifenesin     Other reaction(s): HEADACHE Other reaction(s): HEADACHE  . Penicillins     Other reaction(s): SWELLING Other reaction(s): SWELLING  . Streptomycin     Other reaction(s): CHOKES Other reaction(s): CHOKES    Social History   Socioeconomic History  . Marital status: Married    Spouse name: Not on file  . Number of children: Not on file  . Years of education: Not on file  . Highest education level: Not on file  Occupational History  . Not on file  Social Needs  .  Financial resource strain: Not on file  . Food insecurity:    Worry: Not on file    Inability: Not on file  . Transportation needs:    Medical: Not on file    Non-medical: Not on file  Tobacco Use  . Smoking status: Never Smoker  . Smokeless tobacco: Never Used  Substance and Sexual Activity  . Alcohol use: No    Alcohol/week: 0.0 standard drinks  . Drug use: No  . Sexual activity: Not on file  Lifestyle  . Physical activity:    Days per week: Not on file    Minutes per session: Not on file  . Stress: Not on file  Relationships  . Social connections:    Talks on phone: Not on file    Gets together: Not on file    Attends religious service: Not on file    Active member of club or organization:  Not on file    Attends meetings of clubs or organizations: Not on file    Relationship status: Not on file  . Intimate partner violence:    Fear of current or ex partner: Not on file    Emotionally abused: Not on file    Physically abused: Not on file    Forced sexual activity: Not on file  Other Topics Concern  . Not on file  Social History Narrative  . Not on file    Family History  Problem Relation Age of Onset  . Coronary artery disease Mother   . Hypertension Father   . Heart attack Paternal Grandfather     Review of Systems:  As stated in the HPI and otherwise negative.   BP 140/86   Pulse 86   Ht 6\' 1"  (1.854 m)   Wt 151 lb 6.4 oz (68.7 kg)   SpO2 98%   BMI 19.97 kg/m   Physical Examination:  General: Well developed, well nourished, NAD  HEENT: OP clear, mucus membranes moist  SKIN: warm, dry. No rashes. Neuro: No focal deficits  Musculoskeletal: Muscle strength 5/5 all ext  Psychiatric: Mood and affect normal  Neck: No JVD, no carotid bruits, no thyromegaly, no lymphadenopathy.  Lungs:Clear bilaterally, no wheezes, rhonci, crackles Cardiovascular: Regular rate and rhythm. No murmurs, gallops or rubs. Abdomen:Soft. Bowel sounds present. Non-tender.  Extremities: No lower extremity edema. Pulses are 2 + in the bilateral DP/PT.  Echo 11/20/11: Left ventricle: The cavity size was normal. Wall thickness was normal. Systolic function was normal. The estimated ejection fraction was in the range of 55% to 65%. Wall motion was normal; there were no regional wall motion abnormalities. Left ventricular diastolic function parameters were normal. - Atrial septum: No defect or patent foramen ovale was Identified.  EKG:  EKG is ordered today. The ekg ordered today demonstrates NSR, rate 86 bpm.   Recent Labs: No results found for requested labs within last 8760 hours.   Lipid Panel Followed in primary care.    Wt Readings from Last 3 Encounters:  10/04/17 151  lb 6.4 oz (68.7 kg)  08/12/16 158 lb (71.7 kg)  02/10/16 158 lb 9.6 oz (71.9 kg)     Other studies Reviewed: Additional studies/ records that were reviewed today include:  Review of the above records demonstrates:   Assessment and Plan:   1. CAD without angina: No chest pain. Will continue Plavix and Crestor. He stopped his Toprol due to fatigue.     2. Hyperlipidemia: His lipids are followed in primary care. Continue statin.  Current medicines are reviewed at length with the patient today.  The patient does not have concerns regarding medicines.  The following changes have been made:  no change  Labs/ tests ordered today include:   Orders Placed This Encounter  Procedures  . EKG 12-Lead    Disposition:   FU with me in 12  months  Signed, Lauree Chandler, MD 10/04/2017 8:24 AM    [facility] [place], [place], [place]  [address] Phone: [phone]; Fax: [phone]

## 2017-10-04 NOTE — Patient Instructions (Signed)

## 2017-12-10 ENCOUNTER — Other Ambulatory Visit: Payer: Self-pay | Admitting: Cardiovascular Disease

## 2017-12-14 ENCOUNTER — Other Ambulatory Visit: Payer: Self-pay | Admitting: *Deleted

## 2017-12-14 MED ORDER — NITROGLYCERIN 0.4 MG SL SUBL: 0.4000 mg | SUBLINGUAL_TABLET | SUBLINGUAL | 5 refills | Status: DC | PRN

## 2018-08-04 ENCOUNTER — Telehealth: Payer: Self-pay | Admitting: Cardiovascular Disease

## 2018-08-04 NOTE — Telephone Encounter (Signed)
   Appointment-July 24 with Ferol Luz, PA  COVID-19 Pre-Screening Questions:  . In the past 7 to 10 days have you had a cough,  shortness of breath, headache, congestion, fever (100 or greater) body aches, chills, sore throat, or sudden loss of taste or sense of smell?  no . Have you been around anyone with known Covid 19. no . Have you been around anyone who is awaiting Covid 19 test results in the past 7 to 10 days? no . Have you been around anyone who has been exposed to Covid 19, or has mentioned symptoms of Covid 19 within the past 7 to 10 days? no  If you have any concerns/questions about symptoms patients report during screening (either on the phone or at threshold). Contact the provider seeing the patient or DOD for further guidance.  If neither are available contact a member of the leadership team.      Pt aware to wear mask and arrive 15 minutes prior to appt. He is aware no one can accompany him to appointment.  I spoke with pt. He reports episode of pain lasting a few seconds on Sunday while grocery shopping.   On Monday, Tuesday and Wednesday he had left shoulder and back pain. This pain has improved some today and he is feeling better.  Pain seems to be worse when he moves shoulder a certain way.  He is concerned this could be his heart and is asking if a troponin level could be checked.   I explained to pt the situations in which this level is usually checked.   I scheduled pt to see B Bhagat, PA tomorrow at 11:00 for evaluation of shoulder pain.

## 2018-08-04 NOTE — Progress Notes (Signed)
Cardiology Office Note    Date:  08/05/2018   ID:  Trystan Akhtar, DOB 08/29/1955, MRN [id number]  PCP:  System, Pcp Not In  Cardiologist:  Dr. Angelena Form  Chief Complaint: CP and yearly follow up  History of Present Illness:   Bharath Bernstein is a 63 y.o. male with history of CAD, OSA, and  hyperlipidemia seen for follow up.  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 In December 2009 he had a non-ST elevation MI treated with drug-eluting stents in the circumflex and LAD. He had a follow-up catheterization in February 2010 which showed nonobstructive disease. No ischemia on stress test 05/2015. Previously reduced Toprol 2nd to fatigue and low BP>> stopped Toprol with improved fatigue.   He was doing well on cardiac stand point when last seen by Dr. Angelena Form 09/2017.  Here today for follow-up.  Last Sunday patient had an episode of chest pain and shortness of breath.  He went for grocery shopping in hot sunny day.  He had a mild dyspnea, wearing mask.  While in the store walking, he had sharp "pinching nerve type pain" at substernal area.  He did not felt well for 20 minutes and symptoms resolved spontaneously.  Since then he had no reoccurrence however feeling "sluggish".  This symptoms somewhat different than prior angina.  At baseline, patient is very active doing yard work and walking around house.  Never required to take sublingual nitroglycerin.  He denies palpitation, dizziness, orthopnea, PND syncope or lower extremity edema.  Compliant with his medication.   Past Medical History:  Diagnosis Date  . Back pain   . Coronary artery [place]    [place] [address] s/p DES to RCA. b. NSTEMI 12/2007 s/p DES to Cx and LAD.  Marland Kitchen GERD (gastroesophageal reflux disease)   . Hyperlipidemia     Past Surgical History:  Procedure Laterality Date  . TONSILLECTOMY      Current Medications: Prior to Admission medications   Medication Sig Start  Date End Date Taking? Authorizing Provider  acetaminophen (TYLENOL) 500 MG tablet Take 500 mg by mouth every 4 (four) hours as needed for mild pain.    [provider]  Cholecalciferol (VITAMIN D3) 400 units CAPS Take 2,000 Units by mouth daily.     [provider]  clopidogrel (PLAVIX) 75 MG tablet take 1 tablet by mouth once daily 05/16/15   Burnell Blanks, MD  nitroGLYCERIN (NITROSTAT) 0.4 MG SL tablet Place 1 tablet (0.4 mg total) under the tongue every 5 (five) minutes as needed for chest pain (Call 911 at 3rd dose within 15 minutes). 12/14/17   Burnell Blanks, MD  OVER THE COUNTER MEDICATION Neem Leaf--healthy skin and digestion    [provider]  OVER THE COUNTER MEDICATION Take 1 capsule by mouth daily. FOLIGEN    [provider]  pantoprazole (PROTONIX) 40 MG tablet Take 40 mg by mouth daily.    [provider]  rosuvastatin (CRESTOR) 40 MG tablet Take 1 tablet (40 mg total) by mouth daily. 08/12/16   Burnell Blanks, MD  Saw Palmetto 450 MG CAPS Take 450 mg by mouth as needed (as a health supplement).  03/30/13   [provider]  Turmeric Curcumin 500 MG CAPS Take 1 tablet by mouth daily.    [provider]    Allergies:   Amoxicillin, Citalopram hydrobromide, Clarithromycin, Guaifenesin, Penicillins, and Streptomycin   Social History  Socioeconomic History  . Marital status: Married    Spouse name: Not on file  . Number of children: Not on file  . Years of education: Not on file  . Highest education level: Not on file  Occupational History  . Not on file  Social Needs  . Financial resource strain: Not on file  . Food insecurity    Worry: Not on file    Inability: Not on file  . Transportation needs    Medical: Not on file    Non-medical: Not on file  Tobacco Use  . Smoking status: Never Smoker  . Smokeless tobacco: Never Used  Substance and Sexual Activity  . Alcohol use: No     Alcohol/week: 0.0 standard drinks  . Drug use: No  . Sexual activity: Not on file  Lifestyle  . Physical activity    Days per week: Not on file    Minutes per session: Not on file  . Stress: Not on file  Relationships  . Social Herbalist on phone: Not on file    Gets together: Not on file    Attends religious service: Not on file    Active member of club or organization: Not on file    Attends meetings of clubs or organizations: Not on file    Relationship status: Not on file  Other Topics Concern  . Not on file  Social History Narrative  . Not on file     Family History:  The patient's family history includes Coronary artery disease in his mother; Heart attack in his paternal grandfather; Hypertension in his father.   ROS:   Please see the history of present illness.    ROS All other systems reviewed and are negative.   PHYSICAL EXAM:   VS:  BP 136/84   Pulse 72   Ht 6\' 1"  (1.854 m)   Wt 156 lb (70.8 kg)   SpO2 100%   BMI 20.58 kg/m    GEN: Well nourished, well developed, in no acute distress  HEENT: normal  Neck: no JVD, carotid bruits, or masses Cardiac: RRR; no murmurs, rubs, or gallops,no edema  Respiratory:  clear to auscultation bilaterally, normal work of breathing GI: soft, nontender, nondistended, + BS MS: no deformity or atrophy  Skin: warm and dry, no rash Neuro:  Alert and Oriented x 3, Strength and sensation are intact Psych: euthymic mood, full affect  Wt Readings from Last 3 Encounters:  08/05/18 156 lb (70.8 kg)  10/04/17 151 lb 6.4 oz (68.7 kg)  08/12/16 158 lb (71.7 kg)      Studies/Labs Reviewed:   EKG:  EKG is ordered today.  The ekg ordered today demonstrates NSR at 72 bpm  Recent Labs: No results found for requested labs within last 8760 hours.   Lipid Panel    Component Value Date/Time   CHOL 167 04/18/2014 1613   TRIG 94.0 04/18/2014 1613   HDL 46.00 04/18/2014 1613   CHOLHDL 4 04/18/2014 1613   VLDL 18.8  04/18/2014 1613   LDLCALC 102 (H) 04/18/2014 1613   LDLDIRECT 146.2 11/20/2009 0907    Additional studies/ records that were reviewed today include:   Stress test 05/2015  Nuclear stress EF: 54%. No wall motion abnormalities  There was no ST segment deviation noted during stress.  Defect 1: There is a small defect of mild severity present in the basal inferior location. Likely diaphragmatic attenuation artifact  This is a low risk study. No significant  ischemia identified.       ASSESSMENT & PLAN:   1.  Chest pain/shortness of breath/fatigue -His episode on Sunday (5 days ago) was concerning.  Sudden shortness of breath with chest discomfort.  Symptoms resolved spontaneously within 20 minutes.  This could be anginal equivalent versus heatstroke.  No recurrence however feeling fatigue since then.  At baseline he is very active without any issue.  Discussed noninvasive evaluation versus before watch (spend at least 20 minutes).  He will continue to monitor his symptoms and try sublingual nitroglycerin if reoccurrence.  He will give [place] a call if worsening symptoms.   2. CAD -As summarized above.  Continue Plavix and Crestor.  3. HLD - Continue statin. Followed by PCP.     Medication Adjustments/Labs and Tests Ordered: Current medicines are reviewed at length with the patient today.  Concerns regarding medicines are outlined above.  Medication changes, Labs and Tests ordered today are listed in the Patient Instructions below. Patient Instructions  Medication Instructions:  Your physician recommends that you continue on your current medications as directed. Please refer to the Current Medication list given to you today.  If you need a refill on your cardiac medications before your next appointment, please call your pharmacy.   Lab work: None ordered  If you have labs (blood work) drawn today and your tests are completely normal, you will receive your results only by: Marland Kitchen MyChart  Message (if you have MyChart) OR . A paper copy in the mail If you have any lab test that is abnormal or we need to change your treatment, we will call you to review the results.  Testing/Procedures: None ordered    Follow-Up: At [facility], you and your health needs are our priority.  As part of our continuing mission to provide you with exceptional heart care, we have created designated Provider Care Teams.  These Care Teams include your primary Cardiologist (physician) and Advanced Practice Providers (APPs -  Physician Assistants and Nurse Practitioners) who all work together to provide you with the care you need, when you need it. You will need a follow up appointment in 3 months 11/07/2018 at 9:20.  Please call our office 2 months in advance to schedule this appointment.  You may see Lauree Chandler, MD or one of the following Advanced Practice Providers on your designated Care Team:   [place], PA-C Melina Copa, PA-C . Ermalinda Barrios, PA-C  Any Other Special Instructions Will Be Listed Below (If Applicable).       Jarrett Soho, [place]  08/05/2018 11:37 AM    [facility] [place], [place], [place]  [address] Phone: [phone]; Fax: [phone]

## 2018-08-04 NOTE — Telephone Encounter (Signed)
Pt c/o of Chest Pain: STAT if CP now or developed within 24 hours  1. Are you having CP right now? no  2. Are you experiencing any other symptoms (ex. SOB, nausea, vomiting, sweating)? Left shoulder pain, pain when he moves   3. How long have you been experiencing CP? The chest pain happened on Sunday. Last about 15 mins  4. Is your CP continuous or coming and going? Comes and goes.  Now it happens when he moves a certain way.   5. Have you taken Nitroglycerin? No  ?

## 2018-08-05 ENCOUNTER — Ambulatory Visit: Payer: BC Managed Care – PPO | Admitting: Physician Assistant

## 2018-08-05 ENCOUNTER — Encounter: Payer: Self-pay | Admitting: Physician Assistant

## 2018-08-05 ENCOUNTER — Other Ambulatory Visit: Payer: Self-pay

## 2018-08-05 VITALS — BP 136/84 | HR 72 | Ht 73.0 in | Wt 156.0 lb

## 2018-08-05 DIAGNOSIS — R0789 Other chest pain: Secondary | ICD-10-CM | POA: Diagnosis not present

## 2018-08-05 DIAGNOSIS — E782 Mixed hyperlipidemia: Secondary | ICD-10-CM | POA: Diagnosis not present

## 2018-08-05 DIAGNOSIS — I2510 Atherosclerotic heart disease of native coronary artery without angina pectoris: Secondary | ICD-10-CM

## 2018-08-05 NOTE — Patient Instructions (Addendum)
Medication Instructions:  Your physician recommends that you continue on your current medications as directed. Please refer to the Current Medication list given to you today.  If you need a refill on your cardiac medications before your next appointment, please call your pharmacy.   Lab work: None ordered  If you have labs (blood work) drawn today and your tests are completely normal, you will receive your results only by: Marland Kitchen MyChart Message (if you have MyChart) OR . A paper copy in the mail If you have any lab test that is abnormal or we need to change your treatment, we will call you to review the results.  Testing/Procedures: None ordered    Follow-Up: At [facility], you and your health needs are our priority.  As part of our continuing mission to provide you with exceptional heart care, we have created designated Provider Care Teams.  These Care Teams include your primary Cardiologist (physician) and Advanced Practice Providers (APPs -  Physician Assistants and Nurse Practitioners) who all work together to provide you with the care you need, when you need it. You will need a follow up appointment in 3 months 11/07/2018 at 9:20.  Please call our office 2 months in advance to schedule this appointment.  You may see Lauree Chandler, MD or one of the following Advanced Practice Providers on your designated Care Team:   [place], PA-C Melina Copa, PA-C . Ermalinda Barrios, PA-C  Any Other Special Instructions Will Be Listed Below (If Applicable).

## 2018-08-12 ENCOUNTER — Encounter: Payer: Self-pay | Admitting: *Deleted

## 2018-08-12 ENCOUNTER — Telehealth: Payer: Self-pay | Admitting: Cardiovascular Disease

## 2018-08-12 DIAGNOSIS — M25512 Pain in left shoulder: Secondary | ICD-10-CM

## 2018-08-12 NOTE — Telephone Encounter (Signed)
New Message    Patient is calling in because he is still experiencing pain in his shoulder, and was instructed on his 08/04/18 visit to take ibuprofen for the pain. States since then he has also sent his PCP and been prescribed a muscle relaxer. Patient states that nothing is working to help his shoulder, and it is still waking him up out of his sleep. Please give patient a call back to advise.

## 2018-08-12 NOTE — Telephone Encounter (Signed)
Pt aware and order entered ./cy °

## 2018-08-12 NOTE — Telephone Encounter (Signed)
Can we set him up for an exercise nuclear stress test next week? If he is willing, that is what I would recommend. It sounds from Vin's note that he had some chest pain a few weeks ago. Thanks, Gerald Stabs

## 2018-08-12 NOTE — Telephone Encounter (Signed)
Spoke with pt and continues to c/o  shoulder pain muscle relaxer and Ibuprofen are not helping Asked pt if tried to take NTG to see if this helps Per pt has not and only will if develops chest discomfort Per pt has to much to do and is unable to take Ntg because will need to lay down if takes .Instructed pt to try heat and  see if this helps also may need xray  and if pt feels this is cardiac related then need to try the NTG and if symptoms increase or worsen then need to go to ED for eval and tx Per pt with previous cardiac episode experienced chest and jaw pain .Will forward to Dr Angelena Form for review and recommendations./cy

## 2018-08-17 ENCOUNTER — Telehealth (HOSPITAL_COMMUNITY): Payer: Self-pay

## 2018-08-17 NOTE — Telephone Encounter (Signed)
Encounter complete. 

## 2018-08-19 ENCOUNTER — Ambulatory Visit (HOSPITAL_COMMUNITY)
Admission: RE | Admit: 2018-08-19 | Discharge: 2018-08-19 | Disposition: A | Discharge status: Home / Self Care | Payer: BC Managed Care – PPO | Source: Ambulatory Visit | Attending: Cardiovascular Disease | Admitting: Cardiovascular Disease

## 2018-08-19 ENCOUNTER — Other Ambulatory Visit: Payer: Self-pay

## 2018-08-19 DIAGNOSIS — M25512 Pain in left shoulder: Secondary | ICD-10-CM | POA: Diagnosis not present

## 2018-08-19 LAB — MYOCARDIAL PERFUSION IMAGING
LV dias vol: 87 mL (ref 62–150)
LV sys vol: 39 mL
Peak HR: 112 {beats}/min
Rest HR: 72 {beats}/min
SDS: 1
SRS: 0
SSS: 1
TID: 1.09

## 2018-08-19 MED ORDER — REGADENOSON 0.4 MG/5ML IV SOLN
0.4000 mg | Freq: Once | INTRAVENOUS | Status: AC
  Administered 2018-08-19: 0.4 mg via INTRAVENOUS

## 2018-08-19 MED ORDER — TECHNETIUM TC 99M TETROFOSMIN IV KIT
29.9000 | PACK | Freq: Once | INTRAVENOUS | Status: AC | PRN
  Administered 2018-08-19: 29.9 via INTRAVENOUS
  Filled 2018-08-19: qty 30

## 2018-08-19 MED ORDER — TECHNETIUM TC 99M TETROFOSMIN IV KIT
9.0000 | PACK | Freq: Once | INTRAVENOUS | Status: AC | PRN
  Administered 2018-08-19: 9 via INTRAVENOUS
  Filled 2018-08-19: qty 9

## 2018-11-06 NOTE — Progress Notes (Signed)
Chief Complaint  Patient presents with  . Follow-up    CAD   History of Present Illness: 63 yo male with history of CAD, OSA, hyperlipidemia here today for cardiac follow up. 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 In December 2009 he had a non-ST elevation MI treated with drug-eluting stents in the circumflex and LAD. He had a follow-up catheterization in February 2010 which showed nonobstructive disease. He was seen in our office May 2017 and he was having fatigue with low BP and chest burning while mowing the grass. Nuclear stress test 05/29/15 with no ischemia. Toprol was stopped and his fatigue resolved. He was seen in our office in July 2020 and had c/o chest pain. Nuclear stress test August 2020 with no ischemia.   He is here today for follow up. The patient denies any chest pain, dyspnea, palpitations, lower extremity edema, orthopnea, PND, dizziness, near syncope or syncope.    Primary Care Physician: Marty Heck, DO  Past Medical History:  Diagnosis Date  . Back pain   . Coronary artery [place]    [place] [address] s/p DES to RCA. b. NSTEMI 12/2007 s/p DES to Cx and LAD.  Marland Kitchen GERD (gastroesophageal reflux disease)   . Hyperlipidemia     Past Surgical History:  Procedure Laterality Date  . TONSILLECTOMY      Current Outpatient Medications  Medication Sig Dispense Refill  . acetaminophen (TYLENOL) 500 MG tablet Take 500 mg by mouth every 4 (four) hours as needed for mild pain.    . Cholecalciferol (VITAMIN D3) 400 units CAPS Take 2,000 Units by mouth daily.     . clopidogrel (PLAVIX) 75 MG tablet take 1 tablet by mouth once daily 30 tablet 11  . nitroGLYCERIN (NITROSTAT) 0.4 MG SL tablet Place 1 tablet (0.4 mg total) under the tongue every 5 (five) minutes as needed for chest pain (Call 911 at 3rd dose within 15 minutes). 25 tablet 5  . OVER THE COUNTER MEDICATION Neem Leaf--healthy skin and digestion    .  pantoprazole (PROTONIX) 40 MG tablet Take 40 mg by mouth daily.    . rosuvastatin (CRESTOR) 40 MG tablet Take 1 tablet (40 mg total) by mouth daily. 90 tablet 3  . Saw Palmetto 450 MG CAPS Take 450 mg by mouth as needed (as a health supplement).     . Turmeric Curcumin 500 MG CAPS Take 1 tablet by mouth daily.     No current facility-administered medications for this visit.     Allergies  Allergen Reactions  . Amoxicillin     REACTION: unspecified REACTION: unspecified  . Citalopram Hydrobromide     Other reaction(s): DROWSINESS Other reaction(s): DROWSINESS  . Clarithromycin     Other reaction(s): DIARRHEA Other reaction(s): DIARRHEA  . Guaifenesin     Other reaction(s): HEADACHE Other reaction(s): HEADACHE  . Penicillins     Other reaction(s): SWELLING Other reaction(s): SWELLING  . Streptomycin     Other reaction(s): CHOKES Other reaction(s): CHOKES    Social History   Socioeconomic History  . Marital status: Married    Spouse name: Not on file  . Number of children: Not on file  . Years of education: Not on file  . Highest education level: Not on file  Occupational History  . Not on file  Social Needs  . Financial resource strain: Not on file  . Food insecurity    Worry: Not on  file    Inability: Not on file  . Transportation needs    Medical: Not on file    Non-medical: Not on file  Tobacco Use  . Smoking status: Never Smoker  . Smokeless tobacco: Never Used  Substance and Sexual Activity  . Alcohol use: No    Alcohol/week: 0.0 standard drinks  . Drug use: No  . Sexual activity: Not on file  Lifestyle  . Physical activity    Days per week: Not on file    Minutes per session: Not on file  . Stress: Not on file  Relationships  . Social Herbalist on phone: Not on file    Gets together: Not on file    Attends religious service: Not on file    Active member of club or organization: Not on file    Attends meetings of clubs or organizations:  Not on file    Relationship status: Not on file  . Intimate partner violence    Fear of current or ex partner: Not on file    Emotionally abused: Not on file    Physically abused: Not on file    Forced sexual activity: Not on file  Other Topics Concern  . Not on file  Social History Narrative  . Not on file    Family History  Problem Relation Age of Onset  . Coronary artery disease Mother   . Hypertension Father   . Heart attack Paternal Grandfather     Review of Systems:  As stated in the HPI and otherwise negative.   BP 114/70   Pulse 83   Ht 6' (1.829 m)   Wt 153 lb 1.9 oz (69.5 kg)   SpO2 98%   BMI 20.77 kg/m   Physical Examination:  General: Well developed, well nourished, NAD  HEENT: OP clear, mucus membranes moist  SKIN: warm, dry. No rashes. Neuro: No focal deficits  Musculoskeletal: Muscle strength 5/5 all ext  Psychiatric: Mood and affect normal  Neck: No JVD, no carotid bruits, no thyromegaly, no lymphadenopathy.  Lungs:Clear bilaterally, no wheezes, rhonci, crackles Cardiovascular: Regular rate and rhythm. No murmurs, gallops or rubs. Abdomen:Soft. Bowel sounds present. Non-tender.  Extremities: No lower extremity edema. Pulses are 2 + in the bilateral DP/PT.   EKG:  EKG is not ordered today. The ekg ordered today demonstrates .   Recent Labs: No results found for requested labs within last 8760 hours.   Lipid Panel Followed in primary care.    Wt Readings from Last 3 Encounters:  11/07/18 153 lb 1.9 oz (69.5 kg)  08/19/18 156 lb (70.8 kg)  08/05/18 156 lb (70.8 kg)     Other studies Reviewed: Additional studies/ records that were reviewed today include:  Review of the above records demonstrates:   Assessment and Plan:   1. CAD without angina: He has no chest pain concerning for angina. Nuclear stress test August 2020 with no ischemia. Will continue Plavix and statin. No beta blocker due to fatigue while on Toprol.     2. Hyperlipidemia:  His lipids are followed in primary care. Continue statin  Current medicines are reviewed at length with the patient today.  The patient does not have concerns regarding medicines.  The following changes have been made:  no change  Labs/ tests ordered today include:   No orders of the defined types were placed in this encounter.   Disposition:   FU with me in 12  months  Signed, Lauree Chandler,  MD 11/07/2018 9:46 AM    [facility] [address], [place], [place]  [address] Phone: [phone]; Fax: [phone]

## 2018-11-07 ENCOUNTER — Encounter: Payer: Self-pay | Admitting: Cardiovascular Disease

## 2018-11-07 ENCOUNTER — Other Ambulatory Visit: Payer: Self-pay

## 2018-11-07 ENCOUNTER — Ambulatory Visit: Payer: BC Managed Care – PPO | Admitting: Cardiovascular Disease

## 2018-11-07 VITALS — BP 114/70 | HR 83 | Ht 72.0 in | Wt 153.1 lb

## 2018-11-07 DIAGNOSIS — E7800 Pure hypercholesterolemia, unspecified: Secondary | ICD-10-CM

## 2018-11-07 DIAGNOSIS — I2510 Atherosclerotic heart disease of native coronary artery without angina pectoris: Secondary | ICD-10-CM | POA: Diagnosis not present

## 2018-11-07 NOTE — Patient Instructions (Signed)

## 2018-11-09 ENCOUNTER — Other Ambulatory Visit: Payer: Self-pay

## 2018-11-09 ENCOUNTER — Ambulatory Visit: Payer: BC Managed Care – PPO | Admitting: Internal Medicine

## 2018-11-09 ENCOUNTER — Encounter: Payer: Self-pay | Admitting: Internal Medicine

## 2018-11-09 VITALS — BP 121/83 | HR 82 | Wt 153.2 lb

## 2018-11-09 DIAGNOSIS — R202 Paresthesia of skin: Secondary | ICD-10-CM

## 2018-11-09 DIAGNOSIS — Z7902 Long term (current) use of antithrombotics/antiplatelets: Secondary | ICD-10-CM

## 2018-11-09 DIAGNOSIS — R39198 Other difficulties with micturition: Secondary | ICD-10-CM | POA: Diagnosis not present

## 2018-11-09 DIAGNOSIS — E782 Mixed hyperlipidemia: Secondary | ICD-10-CM

## 2018-11-09 DIAGNOSIS — G4733 Obstructive sleep apnea (adult) (pediatric): Secondary | ICD-10-CM

## 2018-11-09 DIAGNOSIS — G629 Polyneuropathy, unspecified: Secondary | ICD-10-CM

## 2018-11-09 DIAGNOSIS — R200 Anesthesia of skin: Secondary | ICD-10-CM | POA: Diagnosis not present

## 2018-11-09 DIAGNOSIS — Z79899 Other long term (current) drug therapy: Secondary | ICD-10-CM

## 2018-11-09 DIAGNOSIS — R3912 Poor urinary stream: Secondary | ICD-10-CM

## 2018-11-09 DIAGNOSIS — I2510 Atherosclerotic heart disease of native coronary artery without angina pectoris: Secondary | ICD-10-CM

## 2018-11-09 DIAGNOSIS — Z0000 Encounter for general adult medical examination without abnormal findings: Secondary | ICD-10-CM | POA: Insufficient documentation

## 2018-11-09 DIAGNOSIS — Z87448 Personal history of other diseases of urinary system: Secondary | ICD-10-CM | POA: Insufficient documentation

## 2018-11-09 DIAGNOSIS — Z23 Encounter for immunization: Secondary | ICD-10-CM

## 2018-11-09 LAB — POCT GLYCOSYLATED HEMOGLOBIN (HGB A1C): Hemoglobin A1C: 5.7 % — AB (ref 4.0–5.6)

## 2018-11-09 LAB — GLUCOSE, CAPILLARY: Glucose-Capillary: 89 mg/dL (ref 70–99)

## 2018-11-09 MED ORDER — ROSUVASTATIN CALCIUM 40 MG PO TABS: 40.0000 mg | ORAL_TABLET | Freq: Every day | ORAL | 3 refills | Status: DC

## 2018-11-09 NOTE — Assessment & Plan Note (Signed)
He sometimes has lower extremity tingling when he goes to sleep at night. His feet are also often cold. He denies burning, more just a mild tingling sensation. This does not occur anywhere else. Pedal pulses intact and LE are warm.   - TSH, ferritin, B12, MMA, hemoglobin a1c - CBC at f/u

## 2018-11-09 NOTE — Patient Instructions (Addendum)
Thank you for allowing [place] to provide your care today. Today we discussed your occasional difficulty with urination and vaccination needs.    I have ordered the following labs for you:  Urinalysis, basic metabolic panel, Vitamin 123456, Hemaglobin A1c, ferritin and methylmalonic acid.    I will call if any are abnormal.    Today we made the following changes to your medications:   Please follow-up in six month or sooner if needed.    Please call the internal medicine center clinic if you have any questions or concerns, we may be able to help and keep you from a long and expensive emergency room wait. Our clinic and after hours phone number is [phone], the best time to call is Monday through Friday 9 am to 4 pm but there is always someone available 24/7 if you have an emergency. If you need medication refills please notify your pharmacy one week in advance and they will send [place] a request.

## 2018-11-09 NOTE — Assessment & Plan Note (Signed)
-   refill atorvastatin

## 2018-11-09 NOTE — Assessment & Plan Note (Signed)
He states he occasionally has felt that his stream was slow in the past and so takes urinozinc, which is an OTC variety of supplements for prostate health. He denies difficulty starting stream, sensation of still needing to void post urination, dysuria, and usually only gets up once per night to urinate, sometimes twice.  Overall, he does not seem to be having symptoms   - bmp, UA

## 2018-11-09 NOTE — Progress Notes (Signed)
   CC: lower extremity tingling  HPI:  [name] is a 62 y.o. with PMH as below.   Please see A&P for assessment of the patient's acute and chronic medical conditions.   Past Medical History:  Diagnosis Date  . Back pain   . Coronary artery [place]    [place] [address] s/p DES to RCA. b. NSTEMI 12/2007 s/p DES to Cx and LAD.  Marland Kitchen GERD (gastroesophageal reflux disease)   . Hyperlipidemia   . MYOCARDIAL INFARCTION 06/29/2008   Qualifier: History of  By: Quentin Cornwall CMA, Janett Billow     Review of Systems:   Review of Systems  Constitutional: Negative for diaphoresis and malaise/fatigue.  Respiratory: Negative for cough, shortness of breath and wheezing.   Cardiovascular: Negative for chest pain, palpitations, orthopnea and leg swelling.  Genitourinary: Negative for dysuria, frequency and urgency.  Neurological: Negative for dizziness, sensory change, weakness and headaches.  Psychiatric/Behavioral: Negative for depression. The patient is not nervous/anxious.    Physical Exam:  Constitution: well-nourished, well-nourished male, appears stated age  51: RRR, no m/r/g  Respiratory: CTAB, no w/r/r, no LE edema  Abdominal: +BS, NTTP, non-distended Neuro: a&o, normal affect  Skin: c/d/i    Vitals:   11/09/18 1512  BP: 121/83  Pulse: 82  SpO2: 100%  Weight: 153 lb 3.2 oz (69.5 kg)     Assessment & Plan:   See Encounters Tab for problem based charting.  Patient discussed with Dr. Lynnae January

## 2018-11-09 NOTE — Assessment & Plan Note (Signed)
-   had flu vaccine several days ago  - Tdap due - he will follow-up with outpatient as this will be more affordable

## 2018-11-09 NOTE — Assessment & Plan Note (Addendum)
He has not had chest pain since his stress test in August. He has not had to use his nitroglycerin in over a year. He occasionally becomes SOB with exertion but states this is normal for him and stays very active. See Overview for history.   - cont. plavix

## 2018-11-09 NOTE — Assessment & Plan Note (Addendum)
History of obstructive sleep apnea last tested in 2010. He states he has not required his CPAP in several years. He denies waking up multiple times per night. He wife states he does not snore or stop breathing at night or have breathing pauses like he used to. He is not obese and does not have large neck.   - consider repeat sleep study considering history, will discuss at follow-up.

## 2018-11-10 LAB — URINALYSIS, ROUTINE W REFLEX MICROSCOPIC
Bilirubin, UA: NEGATIVE
Glucose, UA: NEGATIVE
Ketones, UA: NEGATIVE
Leukocytes,UA: NEGATIVE
Nitrite, UA: NEGATIVE
Protein,UA: NEGATIVE
RBC, UA: NEGATIVE
Specific Gravity, UA: 1.008 (ref 1.005–1.030)
Urobilinogen, Ur: 0.2 mg/dL (ref 0.2–1.0)
pH, UA: 5 (ref 5.0–7.5)

## 2018-11-10 LAB — FERRITIN: Ferritin: 183 ng/mL (ref 30–400)

## 2018-11-11 NOTE — Progress Notes (Signed)
Internal Medicine Clinic Attending  Case discussed with Dr. Seawell at the time of the visit.  We reviewed the resident's history and exam and pertinent patient test results.  I agree with the assessment, diagnosis, and plan of care documented in the resident's note.    

## 2018-11-13 LAB — BMP8+ANION GAP
Anion Gap: 14 mmol/L (ref 10.0–18.0)
BUN/Creatinine Ratio: 13 (ref 10–24)
BUN: 13 mg/dL (ref 8–27)
CO2: 25 mmol/L (ref 20–29)
Calcium: 9.9 mg/dL (ref 8.6–10.2)
Chloride: 100 mmol/L (ref 96–106)
Creatinine, Ser: 1.01 mg/dL (ref 0.76–1.27)
GFR calc Af Amer: 91 mL/min/{1.73_m2} (ref >59)
GFR calc non Af Amer: 79 mL/min/{1.73_m2} (ref >59)
Glucose: 95 mg/dL (ref 65–99)
Potassium: 4.3 mmol/L (ref 3.5–5.2)
Sodium: 139 mmol/L (ref 134–144)

## 2018-11-13 LAB — VITAMIN B12: Vitamin B-12: 398 pg/mL (ref 232–1245)

## 2018-11-13 LAB — METHYLMALONIC ACID, SERUM: Methylmalonic Acid: 155 nmol/L (ref 0–378)

## 2018-11-13 LAB — TSH: TSH: 1.94 u[IU]/mL (ref 0.450–4.500)

## 2018-11-15 ENCOUNTER — Telehealth: Payer: Self-pay | Admitting: Internal Medicine

## 2018-11-15 NOTE — Telephone Encounter (Signed)
Patient requesting a call back about his Lab Test Results

## 2018-11-16 ENCOUNTER — Encounter: Payer: Self-pay | Admitting: Internal Medicine

## 2018-11-23 NOTE — Telephone Encounter (Signed)
I'm sorry, I should have notified you. I spoke with both him and his wife last week.

## 2018-12-12 ENCOUNTER — Ambulatory Visit: Payer: BC Managed Care – PPO | Admitting: Internal Medicine

## 2018-12-12 ENCOUNTER — Telehealth: Payer: Self-pay | Admitting: Internal Medicine

## 2018-12-12 ENCOUNTER — Other Ambulatory Visit: Payer: Self-pay

## 2018-12-12 DIAGNOSIS — Z9181 History of falling: Secondary | ICD-10-CM | POA: Diagnosis not present

## 2018-12-12 DIAGNOSIS — M25561 Pain in right knee: Secondary | ICD-10-CM | POA: Diagnosis not present

## 2018-12-12 DIAGNOSIS — Z79899 Other long term (current) drug therapy: Secondary | ICD-10-CM

## 2018-12-12 DIAGNOSIS — E782 Mixed hyperlipidemia: Secondary | ICD-10-CM | POA: Diagnosis not present

## 2018-12-12 DIAGNOSIS — G8911 Acute pain due to trauma: Secondary | ICD-10-CM

## 2018-12-12 MED ORDER — ROSUVASTATIN CALCIUM 40 MG PO TABS: 40.0000 mg | ORAL_TABLET | Freq: Every day | ORAL | 3 refills | Status: DC

## 2018-12-12 NOTE — Patient Instructions (Signed)
You were seen in the clinic for knee pain.  On physical examination, there were no findings that would suggest a fracture of your bone or tear of a ligament.  The symptoms you are having are likely due to residual inflammation from your fall.  I would advise that you continue to monitor your symptoms and if you still have the symptoms in about 1 month, we can do an x-ray at that time to assess for any fractures.  Thank you for allowing [place] to be part of your medical care!

## 2018-12-12 NOTE — Telephone Encounter (Signed)
Pt had a fall; pls contact [phone]

## 2018-12-12 NOTE — Telephone Encounter (Signed)
Thank you for the update!

## 2018-12-12 NOTE — Progress Notes (Signed)
   CC: Knee pain  HPI: Patient is a 63 year old male with past medical history as below who presents with chief complaint of knee pain after fall 3 weeks ago.  [name] is a 63 y.o.   Past Medical History:  Diagnosis Date  . Back pain   . Coronary artery [place]    [place] [address] s/p DES to RCA. b. NSTEMI 12/2007 s/p DES to Cx and LAD.  Marland Kitchen GERD (gastroesophageal reflux disease)   . Hyperlipidemia   . MYOCARDIAL INFARCTION 06/29/2008   Qualifier: History of  By: Quentin Cornwall CMA, Jessica     Review of Systems:   Review of Systems  Constitutional: Negative for chills and fever.  HENT: Negative for congestion.   Respiratory: Negative for cough and shortness of breath.   Cardiovascular: Negative for chest pain.  Gastrointestinal: Negative for abdominal pain, constipation, diarrhea, nausea and vomiting.  All other systems reviewed and are negative.    Physical Exam:  Vitals:   12/12/18 1510  BP: (!) 143/99  Pulse: 79  Temp: 98 F (36.7 C)  TempSrc: Oral  SpO2: 100%  Weight: 154 lb 1.6 oz (69.9 kg)  Height: 6\' 1"  (1.854 m)   Physical Exam  Constitutional: He is well-developed, well-nourished, and in no distress.  HENT:  Head: Normocephalic and atraumatic.  Eyes: EOM are normal. Right eye exhibits no discharge. Left eye exhibits no discharge.  Neck: Normal range of motion. No tracheal deviation present.  Cardiovascular: Normal rate and regular rhythm. Exam reveals no gallop and no friction rub.  No murmur heard. Pulmonary/Chest: Effort normal and breath sounds normal. No respiratory distress. He has no wheezes. He has no rales.  Abdominal: Soft. He exhibits no distension. There is no abdominal tenderness. There is no rebound and no guarding.  Musculoskeletal: Normal range of motion.        General: No tenderness, deformity or edema.     Comments: No abnormality in knee appearance, no swelling, warmth.  No tenderness at superior or inferior insertion. Mild tenderness  on leg motion with compression on patella. Normal anterior drawer, Lachman, McMurray tests.  Neurological: He is alert. Coordination normal.  Skin: Skin is warm and dry. No rash noted. He is not diaphoretic. No erythema.  Psychiatric: Memory and judgment normal.     Assessment & Plan:   See Encounters Tab for problem based charting.  Patient seen and discussed with Dr. Daryll Drown

## 2018-12-12 NOTE — Assessment & Plan Note (Signed)
Patient reports falling down 3 stairs approximately 3 weeks ago.  Initially he had significant bruising and pain of the right knee.  Patient reports bruising, pain has improved.  He reports continued pain of right knee, present when kneeling on that knee or occasionally with certain pivoting movements.  Physical exam was without significant finding.  Given examination, it is unlikely that patient has a fracture or significant ligament tear.  Discomfort is likely secondary to residual inflammation, damage from fall that will likely improve with time.  Recommended patient use ibuprofen for pain relief, increase activity as tolerated, and follow-up in 1 month if symptoms persist.

## 2018-12-12 NOTE — Assessment & Plan Note (Signed)
Refill sent for rosuvastatin.

## 2018-12-12 NOTE — Telephone Encounter (Signed)
Fall 2-3 weeks ago, R knee still can not bear weight, very concerned [facility] 11/30 at 1515

## 2018-12-13 ENCOUNTER — Telehealth: Payer: Self-pay | Admitting: Internal Medicine

## 2018-12-13 NOTE — Telephone Encounter (Signed)
Pt is requesting pls [phone]

## 2018-12-13 NOTE — Telephone Encounter (Signed)
Pt calls and wants nurse to call insurance co and give providers name, attempted to do so after pt insist, spoke w/ 4 people, no one could help, advised pt to call his human resources dept and see if they could help

## 2018-12-15 NOTE — Progress Notes (Signed)
Internal Medicine Clinic Attending  I saw and evaluated the patient.  I personally confirmed the key portions of the history and exam documented by Dr. MacLean and I reviewed pertinent patient test results.  The assessment, diagnosis, and plan were formulated together and I agree with the documentation in the resident's note.  

## 2018-12-21 ENCOUNTER — Ambulatory Visit: Payer: BC Managed Care – PPO | Admitting: Internal Medicine

## 2018-12-21 ENCOUNTER — Encounter: Payer: Self-pay | Admitting: Internal Medicine

## 2018-12-21 ENCOUNTER — Other Ambulatory Visit: Payer: Self-pay

## 2018-12-21 VITALS — BP 130/95 | HR 82 | Temp 98.6°F | Ht 73.0 in | Wt 155.5 lb

## 2018-12-21 DIAGNOSIS — Z9181 History of falling: Secondary | ICD-10-CM | POA: Diagnosis not present

## 2018-12-21 DIAGNOSIS — W19XXXD Unspecified fall, subsequent encounter: Secondary | ICD-10-CM

## 2018-12-21 DIAGNOSIS — G4733 Obstructive sleep apnea (adult) (pediatric): Secondary | ICD-10-CM | POA: Diagnosis not present

## 2018-12-21 DIAGNOSIS — Z0000 Encounter for general adult medical examination without abnormal findings: Secondary | ICD-10-CM

## 2018-12-21 NOTE — Patient Instructions (Addendum)
Thank you for allowing [place] to provide your care today. Today we discussed your recent fall, difficulty sleeping, and heart history    I have ordered a sleep study for you. They will call you to schedule an appointment   Please go to [facility] to complete a tetanus shot at your earlier convenience.    Please follow-up in one year.    Please call the internal medicine center clinic if you have any questions or concerns, we may be able to help and keep you from a long and expensive emergency room wait. Our clinic and after hours phone number is [phone], the best time to call is Monday through Friday 9 am to 4 pm but there is always someone available 24/7 if you have an emergency. If you need medication refills please notify your pharmacy one week in advance and they will send [place] a request.

## 2018-12-21 NOTE — Progress Notes (Signed)
   CC: fall  HPI:  [name] is a 63 y.o. with PMH as below.   Please see A&P for assessment of the patient's acute and chronic medical conditions.   Past Medical History:  Diagnosis Date  . Back pain   . Coronary artery [place]    [place] [address] s/p DES to RCA. b. NSTEMI 12/2007 s/p DES to Cx and LAD.  Marland Kitchen GERD (gastroesophageal reflux disease)   . Hyperlipidemia   . MYOCARDIAL INFARCTION 06/29/2008   Qualifier: History of  By: Quentin Cornwall CMA, Jessica     Review of Systems:   Review of Systems  Respiratory: Negative for cough and shortness of breath.   Cardiovascular: Negative for chest pain, palpitations and leg swelling.  Musculoskeletal: Negative for falls and joint pain.  Neurological: Negative for dizziness and tingling.  10 point ROS otherwise negative except as noted in HPI  Physical Exam: Constitution: NAD, appears stated age Cardio: RRR, no m/r/g, no LE edema  Respiratory: CTA, no w/r/r Abdominal: NTTP, soft, non-distended MSK: moving all extremities, Neuro: normal affect, a&ox3 Skin: c/d/i    There were no vitals filed for this visit.   Assessment & Plan:   See Encounters Tab for problem based charting.  Patient discussed with Dr. Dareen Piano

## 2018-12-23 DIAGNOSIS — W19XXXA Unspecified fall, initial encounter: Secondary | ICD-10-CM | POA: Insufficient documentation

## 2018-12-23 NOTE — Assessment & Plan Note (Signed)
He has been doing well and has not fallen again, previously had mechanical fall where he injured his knee. Xr was normal. He feels that his knee is mostly healed. It does not hurt and there is no erythema, swelling or bruising. He did bump his shin against a table five days ago and has a knot that has not yet resolved. - provided reassurance.

## 2018-12-23 NOTE — Assessment & Plan Note (Signed)
Due for colonoscopy. He would prefer to complete immunochemical fit test. Risks and benefits discussed.  - fit test ordered.

## 2018-12-23 NOTE — Assessment & Plan Note (Signed)
History of OSA and used to use CPAP which he discontinued on his own. He does wake up a lot at night but thinks this may be when he has to urinate if he drinks too much fluid before bed. He does not snore. With his history of sleep apnea I would like to have him tested again to make sure he does not need a CPAP machine at him.   - Split night referral

## 2018-12-26 NOTE — Progress Notes (Signed)
Internal Medicine Clinic Attending  Case discussed with Dr. Seawell at the time of the visit.  We reviewed the resident's history and exam and pertinent patient test results.  I agree with the assessment, diagnosis, and plan of care documented in the resident's note.    

## 2018-12-30 ENCOUNTER — Telehealth: Payer: Self-pay | Admitting: Internal Medicine

## 2018-12-30 NOTE — Telephone Encounter (Signed)
His results will not be back for a few days while they process, but I will call him when they result.

## 2018-12-30 NOTE — Telephone Encounter (Signed)
Pt is calling for stool results [phone]

## 2018-12-30 NOTE — Telephone Encounter (Signed)
Patient notified of below. He was very Patent attorney. Hubbard Hartshorn, BSN, RN-BC

## 2019-01-11 ENCOUNTER — Telehealth: Payer: Self-pay | Admitting: Internal Medicine

## 2019-01-11 NOTE — Telephone Encounter (Signed)
Do not see results will check w/ lab

## 2019-01-11 NOTE — Telephone Encounter (Signed)
Pt is checking on the stool results; pls contact [phone]

## 2019-01-15 ENCOUNTER — Other Ambulatory Visit: Payer: Self-pay | Admitting: Internal Medicine

## 2019-01-18 ENCOUNTER — Telehealth: Payer: Self-pay | Admitting: *Deleted

## 2019-01-18 NOTE — Telephone Encounter (Signed)
rtc to pt, informed him new cards were being put in mail today, he may do them at his leisure and return them physically instead of mail. He is agreeable

## 2019-01-18 NOTE — Telephone Encounter (Signed)
Pt is calling r/t stool cards Also COVID 19 vaccine for he and his wife

## 2019-01-26 ENCOUNTER — Other Ambulatory Visit: Payer: BC Managed Care – PPO

## 2019-01-26 DIAGNOSIS — Z0000 Encounter for general adult medical examination without abnormal findings: Secondary | ICD-10-CM

## 2019-01-27 LAB — FECAL OCCULT BLOOD, IMMUNOCHEMICAL: Fecal Occult Bld: NEGATIVE

## 2019-02-15 ENCOUNTER — Encounter: Payer: BC Managed Care – PPO | Admitting: Internal Medicine

## 2019-02-20 ENCOUNTER — Telehealth: Payer: Self-pay

## 2019-02-20 NOTE — Telephone Encounter (Signed)
You can let him know his allergies to antibiotics would not affect him taking the covid-19 vaccine and involve separate mechanisms within the body.

## 2019-02-20 NOTE — Telephone Encounter (Signed)
Pt has 6 allergies involving abx and I know the vaccine is not r/t abx but just wanted your opinion

## 2019-02-20 NOTE — Telephone Encounter (Signed)
Pt wants to know is it okay to take the COVID injection. Please call pt back.

## 2019-02-21 NOTE — Telephone Encounter (Signed)
RTC to patient and relayed Dr. Aurelio Jew instructions R/T Covid-19 vaccine.  Pt informs RN when he was "A young lad in [place], I received a vaccine and had a choking feeling in my throat right afterwards".  Pt is requesting a callback from PCP to discuss. SChaplin, RN,BSN

## 2019-02-22 NOTE — Telephone Encounter (Signed)
Returned call to patient and let him know he is safe to take vaccine.

## 2019-02-23 ENCOUNTER — Ambulatory Visit: Payer: BC Managed Care – PPO | Attending: Family

## 2019-02-23 DIAGNOSIS — Z23 Encounter for immunization: Secondary | ICD-10-CM | POA: Insufficient documentation

## 2019-02-23 NOTE — Progress Notes (Signed)
   Covid-19 Vaccination Clinic  Name:  [name]    MRN: [id number] DOB: Jun 02, 1955  02/23/2019  [name] was observed post Covid-19 immunization for 30 minutes based on pre-vaccination screening without incidence. He was provided with Vaccine Information Sheet and instruction to access the V-Safe system.   [name] was instructed to call 911 with any severe reactions post vaccine: Marland Kitchen Difficulty breathing  . Swelling of your face and throat  . A fast heartbeat  . A bad rash all over your body  . Dizziness and weakness    Immunizations Administered    Name Date Dose VIS Date Route   Moderna COVID-19 Vaccine 02/23/2019 12:59 PM 0.5 mL 12/13/2018 Intramuscular   Manufacturer: Moderna   Lot: [id number]   [id number]

## 2019-03-01 ENCOUNTER — Encounter: Payer: BC Managed Care – PPO | Admitting: Internal Medicine

## 2019-03-22 ENCOUNTER — Other Ambulatory Visit: Payer: Self-pay

## 2019-03-22 ENCOUNTER — Encounter: Payer: Self-pay | Admitting: Internal Medicine

## 2019-03-22 ENCOUNTER — Ambulatory Visit (INDEPENDENT_AMBULATORY_CARE_PROVIDER_SITE_OTHER): Payer: BC Managed Care – PPO | Admitting: Internal Medicine

## 2019-03-22 VITALS — BP 112/74 | HR 84 | Temp 98.6°F | Wt 156.3 lb

## 2019-03-22 DIAGNOSIS — Z114 Encounter for screening for human immunodeficiency virus [HIV]: Secondary | ICD-10-CM

## 2019-03-22 DIAGNOSIS — L299 Pruritus, unspecified: Secondary | ICD-10-CM

## 2019-03-22 DIAGNOSIS — Z87448 Personal history of other diseases of urinary system: Secondary | ICD-10-CM

## 2019-03-22 DIAGNOSIS — I2510 Atherosclerotic heart disease of native coronary artery without angina pectoris: Secondary | ICD-10-CM

## 2019-03-22 DIAGNOSIS — R238 Other skin changes: Secondary | ICD-10-CM

## 2019-03-22 DIAGNOSIS — R200 Anesthesia of skin: Secondary | ICD-10-CM | POA: Diagnosis not present

## 2019-03-22 DIAGNOSIS — R202 Paresthesia of skin: Secondary | ICD-10-CM

## 2019-03-22 DIAGNOSIS — Z1159 Encounter for screening for other viral diseases: Secondary | ICD-10-CM

## 2019-03-22 MED ORDER — CLOBETASOL PROPIONATE 0.05 % EX SOLN: 1.0000 "application " | Freq: Two times a day (BID) | CUTANEOUS | 0 refills | Status: DC

## 2019-03-22 NOTE — Patient Instructions (Signed)
Thank you for allowing [place] to provide your care today. Today we discussed your feet numbness and difficulty with urination.     I have ordered the following labs for you:    I will call if any are abnormal.    Today we made the following changes to your medications:   Please START taking  Clobetasol propionate topical solution .05% - apply to scalp twice daily for itching.   Please follow-up in one month to discuss your neuropathy.    Please call the internal medicine center clinic if you have any questions or concerns, we may be able to help and keep you from a long and expensive emergency room wait. Our clinic and after hours phone number is [phone], the best time to call is Monday through Friday 9 am to 4 pm but there is always someone available 24/7 if you have an emergency. If you need medication refills please notify your pharmacy one week in advance and they will send [place] a request.

## 2019-03-22 NOTE — Progress Notes (Addendum)
   CC: urinary hesitation  HPI:  [name] is a 64 y.o. with PMH as below.   Please see A&P for assessment of the patient's acute and chronic medical conditions.    He continues to have tingling in his lower extremities bilaterally which has been off and on for about a year now. This is only distal to the forefoot and is worse at night when he is lying down, and his feet are very cold. It has not worsened. There is no numbness in the upper extremities. He denies pain or burning sensation. He is not having any other symptoms.  Continues to have some issues with urination. Sometimes has hesitation with starts and stops although does not have issue with initiating stream. Denies sensation of feeling like he still needs to urinate after he is done. Only gets up once per night to pee. Denies dysuria or frequent urination. This has been ongoing and has not worsened, UA during last visit was within normal limits.   Past Medical History:  Diagnosis Date  . Back pain   . Coronary artery [place]    [place] [address] s/p DES to RCA. b. NSTEMI 12/2007 s/p DES to Cx and LAD.  Marland Kitchen GERD (gastroesophageal reflux disease)   . Hyperlipidemia   . MYOCARDIAL INFARCTION 06/29/2008   Qualifier: History of  By: Quentin Cornwall CMA, Janett Billow     Review of Systems:   10 point ROS negative except as noted in HPI  Physical Exam: Constitution: NAD, appears stated age Cardio: RRR, no m/r/g, no LE edema  Respiratory: CTA, no w/r/r Abdominal: NTTP, soft, non-distended MSK: moving all extremities Neuro: normal affect, a&ox3 Skin: scalp area dry with some flaking skin, no erythema or lesions   Vitals:   03/22/19 1557  BP: 112/74  Pulse: 84  Temp: 98.6 F (37 C)  TempSrc: Oral  SpO2: 96%  Weight: 156 lb 4.8 oz (70.9 kg)    Assessment & Plan:   See Encounters Tab for problem based charting.  Patient discussed with Dr. Daryll Drown

## 2019-03-28 ENCOUNTER — Ambulatory Visit: Payer: BC Managed Care – PPO | Attending: Family

## 2019-03-28 DIAGNOSIS — R238 Other skin changes: Secondary | ICD-10-CM | POA: Insufficient documentation

## 2019-03-28 DIAGNOSIS — Z23 Encounter for immunization: Secondary | ICD-10-CM

## 2019-03-28 NOTE — Progress Notes (Signed)
   Covid-19 Vaccination Clinic  Name:  [name]    MRN: [id number] DOB: 26-Sep-1955  03/28/2019  [name] was observed post Covid-19 immunization for 15 minutes without incident. He was provided with Vaccine Information Sheet and instruction to access the V-Safe system.   [name] was instructed to call 911 with any severe reactions post vaccine: Marland Kitchen Difficulty breathing  . Swelling of face and throat  . A fast heartbeat  . A bad rash all over body  . Dizziness and weakness   Immunizations Administered    Name Date Dose VIS Date Route   Moderna COVID-19 Vaccine 03/28/2019 11:26 AM 0.5 mL 12/13/2018 Intramuscular   Manufacturer: Moderna   Lot: [id number]   [id number]

## 2019-03-28 NOTE — Assessment & Plan Note (Signed)
He continues to have tingling in his lower extremities bilaterally which has been off and on for about a year now. This is only distal to the forefoot and is worse at night when he is lying down, and his feet are very cold. It has not worsened. There is no numbness in the upper extremities. He denies pain or burning sensation. He is not having any other symptoms. Previously glucose, A1C, TSH, B12, MMA were all within normal limits. UA was normal.   Unable to completely address this issue as he was accompanied by his wife and was concerned for her care. We agreed to continue addressing this further at follow-up. With his symptoms being worse at night, especially when he is cold, he will try using a heating pad to see if this helps at night.   - does not have HCV or HIV screening in chart, will order at follow-up - discuss options for further work-up at follow-up

## 2019-03-28 NOTE — Assessment & Plan Note (Signed)
His scalp itches and is dry. This has been an issue in the past for which clobetasol helped.   - clobetasol bid for four weeks or until symptoms improve

## 2019-03-28 NOTE — Assessment & Plan Note (Signed)
Continues to have some issues with urination. Sometimes has hesitation with starts and stops although does not have issue with initiating stream. Denies sensation of feeling like he still needs to urinate after he is done. Only gets up once per night to pee. Denies dysuria or frequent urination. This has been ongoing and has not worsened, UA during last visit was within normal limits.   - discussed starting flomax, he states he will think about this.

## 2019-03-29 ENCOUNTER — Telehealth: Payer: Self-pay | Admitting: Internal Medicine

## 2019-03-29 NOTE — Progress Notes (Signed)
Internal Medicine Clinic Attending  Case discussed with Dr. Seawell at the time of the visit.  We reviewed the resident's history and exam and pertinent patient test results.  I agree with the assessment, diagnosis, and plan of care documented in the resident's note.    

## 2019-03-29 NOTE — Telephone Encounter (Signed)
Pt wanted to let physician know he is sore and slight temp after covid vacccine pls contact [phone]

## 2019-03-29 NOTE — Telephone Encounter (Signed)
RTC to patient, he states he received his 2nd Covid vaccine yesterday.  C/O chills and body aches which started last night, states temp is 100.4.  RN reassured pt this is a normal immune response to the vaccine and should resolve in 24-48 hours, to push fluids, and can take tylenol as needed for body aches.  He verbalized understanding. Advised pt to call back if still symptomatic after 48 hours. SChaplin, RN,BSN

## 2019-03-29 NOTE — Telephone Encounter (Signed)
RTC to patient, no answer, no VM obtained.  Will try call again later. SChaplin, RN,BSN

## 2019-04-05 ENCOUNTER — Telehealth: Payer: Self-pay | Admitting: Internal Medicine

## 2019-04-05 NOTE — Telephone Encounter (Signed)
Pt wanted to let the physician know he have a dry cough after the vaccine, pls contact [phone]

## 2019-04-05 NOTE — Telephone Encounter (Signed)
RTC, pt states he has a consistent, dry cough and woke up with a headache and feeling tired this morning.  He received his 2nd Covid vaccine on 03/28/19 (see telephone note from 03/29/19).  He denies fever, chills, body aches, pain, and/or SOB.  He states he just took a first dose of Delsym OTC cough syrup.  Pt was offered telehealth appt in [facility] today, he declines.  Pt asking for Dr. Aurelio Jew advice. Pt informed if he develops any SOB to present to ER and he verbalized understanding. SChaplin, RN,BSN

## 2019-04-10 NOTE — Telephone Encounter (Signed)
If he is having ongoing symptoms he should have a telehealth visit or be seen in clinic. Thank you.

## 2019-04-10 NOTE — Telephone Encounter (Signed)
TC to patient, states he is feeling much better and his symptoms have subsided.  Pt states he would like to have a routine f/u with Dr. Sharon Seller, but is in clinic with his wife.  Will forward to front desk to assist patient in scheduling routine follow up visit with Dr. Sharon Seller. (Per Dr. Aurelio Jew last note, pt is to follow up in April for neuropathy). Thanks, SChaplin, RN,BSN

## 2019-04-11 NOTE — Telephone Encounter (Signed)
Spoke with the patient this morning.  He is sch to see Dr.  Sharon Seller on 04/26/2019 @ 3:45 pm.

## 2019-04-12 ENCOUNTER — Other Ambulatory Visit (INDEPENDENT_AMBULATORY_CARE_PROVIDER_SITE_OTHER): Payer: BC Managed Care – PPO

## 2019-04-12 DIAGNOSIS — Z1159 Encounter for screening for other viral diseases: Secondary | ICD-10-CM

## 2019-04-12 DIAGNOSIS — I2510 Atherosclerotic heart disease of native coronary artery without angina pectoris: Secondary | ICD-10-CM

## 2019-04-12 DIAGNOSIS — Z114 Encounter for screening for human immunodeficiency virus [HIV]: Secondary | ICD-10-CM

## 2019-04-12 NOTE — Addendum Note (Signed)
Addended by: Orson Gear on: 04/12/2019 11:30 AM   Modules accepted: Orders

## 2019-04-12 NOTE — Addendum Note (Signed)
Addended by: Molli Hazard A on: 04/12/2019 11:27 AM   Modules accepted: Orders

## 2019-04-13 LAB — HIV ANTIBODY (ROUTINE TESTING W REFLEX): HIV Screen 4th Generation wRfx: NONREACTIVE

## 2019-04-13 LAB — LIPID PANEL
Chol/HDL Ratio: 2.8 ratio (ref 0.0–5.0)
Cholesterol, Total: 171 mg/dL (ref 100–199)
HDL: 62 mg/dL (ref >39)
LDL Chol Calc (NIH): 86 mg/dL (ref 0–99)
Triglycerides: 130 mg/dL (ref 0–149)
VLDL Cholesterol Cal: 23 mg/dL (ref 5–40)

## 2019-04-13 LAB — HEPATITIS C ANTIBODY: Hep C Virus Ab: <0.1 s/co ratio (ref 0.0–0.9)

## 2019-04-18 NOTE — Telephone Encounter (Signed)
Pt would like for you to send him a copy of his labs, he does not use my chart and will not

## 2019-04-26 ENCOUNTER — Encounter: Payer: BC Managed Care – PPO | Admitting: Internal Medicine

## 2019-05-03 ENCOUNTER — Ambulatory Visit (INDEPENDENT_AMBULATORY_CARE_PROVIDER_SITE_OTHER): Payer: BC Managed Care – PPO | Admitting: Internal Medicine

## 2019-05-03 ENCOUNTER — Encounter: Payer: Self-pay | Admitting: Internal Medicine

## 2019-05-03 VITALS — BP 136/86 | HR 88 | Wt 156.8 lb

## 2019-05-03 DIAGNOSIS — R238 Other skin changes: Secondary | ICD-10-CM | POA: Insufficient documentation

## 2019-05-03 DIAGNOSIS — B354 Tinea corporis: Secondary | ICD-10-CM | POA: Diagnosis not present

## 2019-05-03 DIAGNOSIS — R200 Anesthesia of skin: Secondary | ICD-10-CM | POA: Diagnosis not present

## 2019-05-03 DIAGNOSIS — R202 Paresthesia of skin: Secondary | ICD-10-CM

## 2019-05-03 MED ORDER — CLOTRIMAZOLE 1 % EX OINT: 1.0000 | TOPICAL_OINTMENT | Freq: Two times a day (BID) | CUTANEOUS | 0 refills | Status: DC

## 2019-05-03 NOTE — Assessment & Plan Note (Signed)
He has been having irritation superficially on his abdomen for the past 9 days. He describes it as a dull burning which at it's worst is a 3/10. He denies nausea, abdominal pain, pain with palpation, recent injury, diarrhea, constipation, or other changes in bowel movements. He has not noticed any rash and has not had any injury. He does lift heavy things sometimes around the house. He has had shingles in the past and states this feels different.   On PE he is NTTP, there are no rashes or lesions on the abdomen or back. There is no palpable hernia. His belt does sit directly over the area and may be irritating the skin in this area. Other differential includes mild muscular strain to the abdominals  - wear loose clothing for the next 1-2 weeks, do not wear tight belt, follow-up if symptoms do not improve  - avoid heavy lifting for 2 weeks

## 2019-05-03 NOTE — Assessment & Plan Note (Signed)
  He has tinea corporis on his back. This has been present for a long time, he is not sure how long. He usually uses deodorant occasionally which helps but does not get rid of it.   - clotrimazole bid

## 2019-05-03 NOTE — Patient Instructions (Addendum)
Thank you for allowing [place] to provide your care today. Today we discussed your abdominal irritation and lower extremity tingling.    Please avoid using your belt for the next few weeks and wear loose clothing to the area.   For your feet, please use a heating pad a at night and wear socks throughout the day. If your symptoms worsen or you develop pain, we will need to consider further testing.   Please follow-up as needed.    Please call the internal medicine center clinic if you have any questions or concerns, we may be able to help and keep you from a long and expensive emergency room wait. Our clinic and after hours phone number is [phone], the best time to call is Monday through Friday 9 am to 4 pm but there is always someone available 24/7 if you have an emergency. If you need medication refills please notify your pharmacy one week in advance and they will send [place] a request.

## 2019-05-03 NOTE — Progress Notes (Signed)
   CC: abdominal pain  HPI:  [name] is a 64 y.o. with PMH as below.   Please see A&P for assessment of the patient's acute and chronic medical conditions.   He has been having irritation superficially on his abdomen for the past 9 days. He describes it as a dull burning which at it's worst is a 3/10. He denies nausea, abdominal pain, pain with palpation, recent injury, diarrhea, constipation, or other changes in bowel movements. He has not noticed any rash and has not had any injury. He does lift heavy things sometimes around the house. He has had shingles in the past and states this feels different.   Past Medical History:  Diagnosis Date  . Back pain   . Coronary artery disease    [place] [address] s/p DES to RCA. b. NSTEMI 12/2007 s/p DES to Cx and LAD.  Marland Kitchen GERD (gastroesophageal reflux disease)   . Hyperlipidemia   . MYOCARDIAL INFARCTION 06/29/2008   Qualifier: History of  By: Quentin Cornwall CMA, Janett Billow     Review of Systems:   10 point ROS negative except as noted in HPI  Physical Exam:   Constitution: NAD, appears stated age Cardio: no LE edema  Abdominal: NTTP, soft, non-distended, normal bowel sounds  MSK: moving all extremities Neuro: normal affect, a&ox3 Skin: c/d/i, no rash or lesions.    Vitals:   05/03/19 1548  BP: 136/86  Pulse: 88  SpO2: 98%  Weight: 156 lb 12.8 oz (71.1 kg)     Assessment & Plan:   See Encounters Tab for problem based charting.  Patient discussed with Dr. Lynnae January

## 2019-05-03 NOTE — Assessment & Plan Note (Signed)
Continues to have mild neuropathy in his toes only. This is intermittent but notes it is worse at night when he lays down to go to sleep. His hands and feet are always cold. He denies tingling in his hands. He denies pain or burning in his feet. Previous ferritin, HIV, Hep C, BMP, iron, B12, and MMA all within normal limits. He has not yet used a heating pad on his feet at night.   - CBC with diff - discussed again trying heating pad at night and to wear socks. Discussed possible next steps of EMG and NCV.

## 2019-05-04 LAB — CBC WITH DIFFERENTIAL/PLATELET
Basophils Absolute: 0 10*3/uL (ref 0.0–0.2)
Basos: 1 %
EOS (ABSOLUTE): 0.1 10*3/uL (ref 0.0–0.4)
Eos: 1 %
Hematocrit: 42.5 % (ref 37.5–51.0)
Hemoglobin: 14.6 g/dL (ref 13.0–17.7)
Immature Grans (Abs): 0 10*3/uL (ref 0.0–0.1)
Immature Granulocytes: 0 %
Lymphocytes Absolute: 1.3 10*3/uL (ref 0.7–3.1)
Lymphs: 23 %
MCH: 30.3 pg (ref 26.6–33.0)
MCHC: 34.4 g/dL (ref 31.5–35.7)
MCV: 88 fL (ref 79–97)
Monocytes Absolute: 0.7 10*3/uL (ref 0.1–0.9)
Monocytes: 13 %
Neutrophils Absolute: 3.4 10*3/uL (ref 1.4–7.0)
Neutrophils: 62 %
Platelets: 228 10*3/uL (ref 150–450)
RBC: 4.82 x10E6/uL (ref 4.14–5.80)
RDW: 12.8 % (ref 11.6–15.4)
WBC: 5.5 10*3/uL (ref 3.4–10.8)

## 2019-05-04 NOTE — Addendum Note (Signed)
Addended by: Molli Hazard A on: 05/04/2019 12:27 PM   Modules accepted: Level of Service

## 2019-05-04 NOTE — Progress Notes (Signed)
Internal Medicine Clinic Attending  Case discussed with Dr. Seawell at the time of the visit.  We reviewed the resident's history and exam and pertinent patient test results.  I agree with the assessment, diagnosis, and plan of care documented in the resident's note.    

## 2019-05-05 ENCOUNTER — Encounter: Payer: Self-pay | Admitting: Internal Medicine

## 2019-05-17 ENCOUNTER — Encounter: Payer: BC Managed Care – PPO | Admitting: Internal Medicine

## 2019-05-25 ENCOUNTER — Telehealth: Payer: Self-pay | Admitting: Internal Medicine

## 2019-05-25 NOTE — Telephone Encounter (Signed)
Per dr Sharon Seller, rtc to pt, gave him results verbally, he would like the results mailed, stated he has not rec'd informed him it takes appr 2 weeks because of slowness of mail. He is agreeable

## 2019-05-25 NOTE — Telephone Encounter (Signed)
Pt would like a copy of his lab results [phone]

## 2019-05-25 NOTE — Telephone Encounter (Signed)
Lab results have been mailed.

## 2019-06-23 ENCOUNTER — Telehealth: Payer: Self-pay | Admitting: Internal Medicine

## 2019-06-23 NOTE — Telephone Encounter (Signed)
Would like a call back.  Has extreme bruising on left leg above knee after working in the yard using a weed eater that keep hitting his leg about week and half ago.  Now feeling knots in that area that is uncomfortable.  Call back number at [phone].

## 2019-06-23 NOTE — Telephone Encounter (Signed)
Pt was advised to go to urg care but he has decided to wait, precautions given : short of breath, chest pain, severe pain, swelling warmth in the leg call 911 or go to ED. He is encouraged to go to urg care today but states he is going to wait it out and see. He will call Monday if needed

## 2019-06-26 NOTE — Telephone Encounter (Signed)
Thank you. He can also follow-up in clinic if his leg continues to bother him.

## 2019-06-28 NOTE — Telephone Encounter (Signed)
Pt still has "knots", will see dr Sharon Seller 6/30 at 1345

## 2019-07-05 ENCOUNTER — Telehealth: Payer: Self-pay | Admitting: Cardiovascular Disease

## 2019-07-05 NOTE — Telephone Encounter (Signed)
Agree. Not likely a blood clot. [name]

## 2019-07-05 NOTE — Telephone Encounter (Signed)
2 1/2 weeks ago - Working with a weed eater and didn't realize a guard or part of the tool was hitting him above the knee.  Area remains bruised and now some "knots" in are felt under the skin.  Still a little tender to touch.    No pain or discoloration or change in temp below the area of this injury.  The patient would like for Dr. Angelena Form to know this and would like a call back if there is any concern for a blood clot in the bruised area above his knee. I adv him to contact PCP.  He has an appointment w them on 6/30 but would like for Dr. Angelena Form to be aware of this.

## 2019-07-05 NOTE — Telephone Encounter (Signed)
New Message:     Pt said about  2 weeks ago he had a laceration above his right knee. He says there are 3 o4 areas around this site that have lumps in them. He wonder if he needs to come in and let Dr Angelena Form see him for this?

## 2019-07-12 ENCOUNTER — Encounter: Payer: BC Managed Care – PPO | Admitting: Internal Medicine

## 2019-07-27 ENCOUNTER — Telehealth: Payer: Self-pay | Admitting: Cardiovascular Disease

## 2019-07-27 MED ORDER — CLOPIDOGREL BISULFATE 75 MG PO TABS: ORAL_TABLET | ORAL | 1 refills | Status: DC

## 2019-07-27 NOTE — Telephone Encounter (Signed)
plavix refilled  

## 2019-07-27 NOTE — Telephone Encounter (Signed)
*  STAT* If patient is at the pharmacy, call can be transferred to refill team.   1. Which medications need to be refilled? (please list name of each medication and dose if known)  Clopidogrel  2. Which pharmacy/location (including street and city if local pharmacy) is medication to be sent to? [facility], [place]  3. Do they need a 30 day or 90 day supply? 90 days and refills-please call today if possible

## 2019-07-27 NOTE — Telephone Encounter (Signed)
Pt requesting a refill on clopidogrel. Pt's PCP has been refilling this medication. Would Dr. Angelena Form like to refill this medication? Please address

## 2019-08-16 ENCOUNTER — Telehealth: Payer: Self-pay | Admitting: Internal Medicine

## 2019-08-16 ENCOUNTER — Encounter: Payer: BC Managed Care – PPO | Admitting: Internal Medicine

## 2019-08-16 NOTE — Telephone Encounter (Signed)
Pt reporting feeling sluggish and tired since since his COVID-19 Vaccination back in April.  Pt requesting appt for today only with Dr. Sharon Seller.  No appts were ava with his PCP.  Pt was offered an appt for 1:15 with the Red team.  Pt declined as he is unable to do a [place] or come in due to his job constraints for today.  Please call pt back.

## 2019-08-16 NOTE — Telephone Encounter (Signed)
RTC, VM obtained and [employer] compliant message left to call nurse triage back. SChaplin, RN,BSN

## 2019-09-11 ENCOUNTER — Telehealth: Payer: Self-pay | Admitting: Internal Medicine

## 2019-09-11 ENCOUNTER — Ambulatory Visit: Payer: BC Managed Care – PPO | Admitting: Internal Medicine

## 2019-09-11 ENCOUNTER — Encounter: Payer: Self-pay | Admitting: Internal Medicine

## 2019-09-11 ENCOUNTER — Other Ambulatory Visit: Payer: Self-pay

## 2019-09-11 VITALS — BP 129/81 | HR 84 | Temp 98.5°F | Ht 72.0 in | Wt 157.8 lb

## 2019-09-11 DIAGNOSIS — W57XXXA Bitten or stung by nonvenomous insect and other nonvenomous arthropods, initial encounter: Secondary | ICD-10-CM | POA: Insufficient documentation

## 2019-09-11 DIAGNOSIS — S40862A Insect bite (nonvenomous) of left upper arm, initial encounter: Secondary | ICD-10-CM

## 2019-09-11 DIAGNOSIS — S20469A Insect bite (nonvenomous) of unspecified back wall of thorax, initial encounter: Secondary | ICD-10-CM

## 2019-09-11 DIAGNOSIS — R05 Cough: Secondary | ICD-10-CM | POA: Diagnosis not present

## 2019-09-11 DIAGNOSIS — S90861A Insect bite (nonvenomous), right foot, initial encounter: Secondary | ICD-10-CM

## 2019-09-11 DIAGNOSIS — R059 Cough, unspecified: Secondary | ICD-10-CM | POA: Insufficient documentation

## 2019-09-11 MED ORDER — TRIAMCINOLONE ACETONIDE 0.5 % EX OINT: 1.0000 "application " | TOPICAL_OINTMENT | Freq: Two times a day (BID) | CUTANEOUS | 0 refills | Status: DC | PRN

## 2019-09-11 MED ORDER — CETIRIZINE HCL 10 MG PO TABS: 10.0000 mg | ORAL_TABLET | Freq: Every day | ORAL | 1 refills | Status: DC

## 2019-09-11 MED ORDER — HYDROXYZINE HCL 10 MG PO TABS: 10.0000 mg | ORAL_TABLET | Freq: Two times a day (BID) | ORAL | 1 refills | Status: DC | PRN

## 2019-09-11 NOTE — Telephone Encounter (Signed)
Returned call to patient. States he's been dealing with bug bites for 4-5 days. Has been applying cortisone cream and taking benadry 1 cap every evening per the directions of his Pharmacist. States no improvement and now the bites are burning and "driving me crazy." Appt given today at 1:45. Hubbard Hartshorn, BSN, RN-BC

## 2019-09-11 NOTE — Patient Instructions (Addendum)
Thank you for allowing [place] to provide your care today. Today we discussed the bug bite.  I prescribe Zyrtec (to take 1 tablet once daily) and Triamcinolone cream twice a day and Hydroxyzine 1 tablet at night as needed for itching. (You can take Hydroxyzine up to 3 times a day but be aware that this medication makes you drowsy and you should not drive after taking that).  Take rest of your medications as before.  Please come back to clinic in 3-6 months to follow up with your PCP or earlier if your symptoms get worse or not improved. As always, if having severe symptoms, please seek medical attention at emergency room. Should you have any questions or concerns please call the internal medicine clinic at [phone].    Thank you!

## 2019-09-11 NOTE — Assessment & Plan Note (Addendum)
Patient presented to clinic today complaining of itching and burning sensation of skin at the area of bug bites.  He noticed bug bites on his left arm, right foot and back about 4 days ago. He did not feel or see the bugs  that could have caused it. No pain. No systemic symptoms such as fever, chills, muscle or joint pain.  He denies any recent travel, or staying in a hotel or in a new shared bed. No camping. He does not have any pets at home and does not work with animals.  He used some over-the-counter "1% cortisone cream" and Benadryl tablet once daily but it only gave him short relief. He mentions that the itching bothers him a lot.  On exam, he has few small red papules on left arm, right foot and back. No surrounding cellulitis. No evidence of bug bite his fingers or finger webs.  No crusting and no burrows around the papules.  Differential diagnoses are bed bugs, scabies (No crusting and no burrows though), fleas (but he denies working with animals or having pets).   We will do symptom management.  -Hydroxyzine 10 mg at night (can take 1 tablet during the day as well as needed. He is informed that this may make him drowsy. Precautions provided. - Cetirizine 102milligram daily - Triamcinolone cream 0.5 mg twice daily - Advised patient to wash his bedding sheets and cloths and dry them in a dryer in hottest setting. (He has already replaced the bed sheet)

## 2019-09-11 NOTE — Assessment & Plan Note (Addendum)
Patient reports mild, occasional dry cough for past few months.  His cough is very mild and non frequent (may be once or twice a day). He thinks it mostly happens when he has the mask on.  His cough is not associated with SOB. No other symptoms that is suggestive of cough 2/2 GERD, or asthma. The cough does not get worse after he eats or after he lays down. No orthopnea and has normal volume status on exam. Not on ACE I or ARB. No post nasal drip. He does not smoke. No recent URI. Cardiopulmonary exam is unremarkable.   His occasional chronic cough can be due to upper airway cough syndrome or due to mild reaction to the mask material.  -Recomended to try a different mask with a different fabric/material. -Will consider CXR if no improvemet.

## 2019-09-11 NOTE — Telephone Encounter (Signed)
Pls contact pt regarding bug bites [phone]

## 2019-09-11 NOTE — Progress Notes (Signed)
   CC: Bug bites  HPI:  [name] is a 64 y.o. male with PMHx as documented below, presented with bug bites associated with itching and burning sensation. Please refer to problem based charting for further details and assessment and plan of current problem and chronic medical conditions.  Past medical history: CAD, OSA, tinea corporis, HLD, fall Medications: Plavix, Protonix, Crestor 40 mg daily  Past Medical History:  Diagnosis Date  . Back pain   . Coronary artery disease    [place] [address] s/p DES to RCA. b. NSTEMI 12/2007 s/p DES to Cx and LAD.  Marland Kitchen GERD (gastroesophageal reflux disease)   . Hyperlipidemia   . MYOCARDIAL INFARCTION 06/29/2008   Qualifier: History of  By: Quentin Cornwall CMA, Janett Billow     Review of Systems:  Constitutional: Negative for chills and fever.  Respiratory: Negative for shortness of breath. Positive for cough. Cardiovascular: Negative for chest pain and leg swelling.  Gastrointestinal: Negative for abdominal pain, nausea and vomiting.  Neurological: Negative for dizziness and headaches.  Skin: Positive for itching, positive for bug bites  Physical Exam:  Vitals:   09/11/19 1333  BP: 129/81  Pulse: 84  Temp: 98.5 F (36.9 C)  TempSrc: Oral  SpO2: 98%  Weight: 157 lb 12.8 oz (71.6 kg)  Height: 6' (1.829 m)   Constitutional: Well-developed and well-nourished. No acute distress.  HENT:  Head: Normocephalic and atraumatic.  Eyes: Conjunctivae are normal, EOM nl Cardiovascular:  RRR, nl S1S2, no murmur,  no LEE Respiratory: Effort normal and breath sounds normal. No respiratory distress. No wheezes.  GI: No distension. Neurological: Is alert and oriented x 3  Skin: Red papules on left arm, rt foot, lower back Psychiatric: Normal mood and affect. Behavior is normal. Judgment and thought content normal.   Assessment & Plan:   See Encounters Tab for problem based charting.  Patient discussed with Dr. Evette Doffing

## 2019-09-13 NOTE — Progress Notes (Signed)
Internal Medicine Clinic Attending  Case discussed with Dr. Masoudi  At the time of the visit.  We reviewed the resident's history and exam and pertinent patient test results.  I agree with the assessment, diagnosis, and plan of care documented in the resident's note.  

## 2019-09-14 ENCOUNTER — Telehealth: Payer: Self-pay | Admitting: Internal Medicine

## 2019-09-14 NOTE — Telephone Encounter (Signed)
Pt is requesting a call back regarding his boil [phone]

## 2019-09-14 NOTE — Telephone Encounter (Signed)
Pt states this ongoing and is getting worse. Boil is"on my back side".  appt 9/3 at 1015, pt may need to call and reschedule

## 2019-09-15 ENCOUNTER — Ambulatory Visit (INDEPENDENT_AMBULATORY_CARE_PROVIDER_SITE_OTHER): Payer: BC Managed Care – PPO | Admitting: Internal Medicine

## 2019-09-15 ENCOUNTER — Encounter: Payer: Self-pay | Admitting: Internal Medicine

## 2019-09-15 ENCOUNTER — Other Ambulatory Visit: Payer: Self-pay

## 2019-09-15 DIAGNOSIS — L0231 Cutaneous abscess of buttock: Secondary | ICD-10-CM | POA: Insufficient documentation

## 2019-09-15 MED ORDER — SULFAMETHOXAZOLE-TRIMETHOPRIM 400-80 MG PO TABS: 1.0000 | ORAL_TABLET | Freq: Two times a day (BID) | ORAL | 0 refills | Status: AC

## 2019-09-15 NOTE — Patient Instructions (Signed)
To Mr. Tenbrink,  It was a pleasure meeting you today. Today we discussed your abscess. I will prescribe an antibiotic to help with your skin infection. Please call the clinic back if your infection worsens. Have a good day!  Sincerely,  Maudie Mercury, MD

## 2019-09-15 NOTE — Progress Notes (Signed)
   CC: Boil  HPI:  Mr.Larone Stutsman is a 64 y.o. with a PMH noted below who presents to the clinic due to a boil on his L buttock. To see the management of his acute and chronic conditions, please see the separate A&P under the encounters tab.    Past Medical History:  Diagnosis Date  . Back pain   . Coronary artery disease    [place] [address] s/p DES to RCA. b. NSTEMI 12/2007 s/p DES to Cx and LAD.  Marland Kitchen GERD (gastroesophageal reflux disease)   . Hyperlipidemia   . MYOCARDIAL INFARCTION 06/29/2008   Qualifier: History of  By: Quentin Cornwall CMA, Jessica     Review of Systems:   Review of Systems  Constitutional: Negative for chills, diaphoresis, fever, malaise/fatigue and weight loss.  Cardiovascular: Negative for chest pain and palpitations.  Gastrointestinal: Negative for abdominal pain, blood in stool, constipation, diarrhea, nausea and vomiting.  Skin:       Boil on Left buttock     Physical Exam:  Vitals:   09/15/19 1019  BP: 133/86  Pulse: 88  Temp: 98.2 F (36.8 C)  TempSrc: Oral  SpO2: 100%  Weight: 157 lb 1.6 oz (71.3 kg)  Height: 6' (1.829 m)   Physical Exam Vitals and nursing note reviewed.  Constitutional:      General: He is not in acute distress.    Appearance: Normal appearance. He is not ill-appearing or toxic-appearing.  HENT:     Head: Normocephalic and atraumatic.  Eyes:     General:        Right eye: No discharge.        Left eye: No discharge.     Conjunctiva/sclera: Conjunctivae normal.  Cardiovascular:     Rate and Rhythm: Normal rate and regular rhythm.     Pulses: Normal pulses.     Heart sounds: Normal heart sounds. No murmur heard.  No friction rub. No gallop.   Pulmonary:     Effort: Pulmonary effort is normal.     Breath sounds: Normal breath sounds. No wheezing, rhonchi or rales.  Abdominal:     General: Bowel sounds are normal.     Palpations: Abdomen is soft.     Tenderness: There is no abdominal tenderness. There is no guarding.    Musculoskeletal:        General: No swelling.     Right lower leg: No edema.     Left lower leg: No edema.  Skin:    Comments: 1-1.5 cm abscess present on L buttock. Tender and fluctuant on palpation, with surrounding erythema.   Neurological:     General: No focal deficit present.     Mental Status: He is alert and oriented to person, place, and time.  Psychiatric:        Mood and Affect: Mood normal.        Behavior: Behavior normal.     Assessment & Plan:   See Encounters Tab for problem based charting.  Patient discussed with Dr. Evette Doffing

## 2019-09-15 NOTE — Assessment & Plan Note (Signed)
[name] was seen in the clinic today for a "boil" that he has had over the past 3 months.   He states that he did have a similar boil a year and a half ago, and was evaluated by his dermatologist for an I+D. Patient refused procedure, preferring to be seen by a surgeon for the procedure. He states that after the dermatologist evaluation the boil spontaneously resolved.   He presents today for having reoccurrence of his boil for the past 3 months, but came to the clinic today because he has had an increase in pain and tenderness, which is not allowing his to sit straight anymore.   On examination he does have a 1-1.5 cm erythematous, fluctuant, abscess, confirmed on ultrasound. We discussed different types of management, and ultimately will defer surgical intervention at this point and treat empirically. Patient instructed to call back if abscess grows, or he develops systemic infections symptoms (fever, diaphoresis). Patient voiced understanding.   - Bactrim 400-80 mg BID for 7 day course.  - Consider referral to general surgery if he fails antibiotic therapy.

## 2019-09-15 NOTE — Progress Notes (Signed)
Internal Medicine Clinic Attending ? ?Case discussed with Dr. Winters  At the time of the visit.  We reviewed the resident?s history and exam and pertinent patient test results.  I agree with the assessment, diagnosis, and plan of care documented in the resident?s note.  ?

## 2019-09-28 ENCOUNTER — Other Ambulatory Visit: Payer: Self-pay | Admitting: Internal Medicine

## 2019-09-28 ENCOUNTER — Ambulatory Visit (INDEPENDENT_AMBULATORY_CARE_PROVIDER_SITE_OTHER): Payer: BC Managed Care – PPO | Admitting: *Deleted

## 2019-09-28 DIAGNOSIS — K219 Gastro-esophageal reflux disease without esophagitis: Secondary | ICD-10-CM

## 2019-09-28 DIAGNOSIS — Z23 Encounter for immunization: Secondary | ICD-10-CM | POA: Diagnosis not present

## 2019-09-28 MED ORDER — PANTOPRAZOLE SODIUM 40 MG PO TBEC: 40.0000 mg | DELAYED_RELEASE_TABLET | Freq: Every day | ORAL | 5 refills | Status: DC

## 2019-11-03 ENCOUNTER — Ambulatory Visit: Payer: BC Managed Care – PPO | Attending: Family

## 2019-11-03 DIAGNOSIS — Z23 Encounter for immunization: Secondary | ICD-10-CM

## 2019-11-07 ENCOUNTER — Other Ambulatory Visit: Payer: Self-pay

## 2019-11-07 ENCOUNTER — Encounter: Payer: Self-pay | Admitting: Cardiovascular Disease

## 2019-11-07 ENCOUNTER — Ambulatory Visit: Payer: BC Managed Care – PPO | Admitting: Cardiovascular Disease

## 2019-11-07 VITALS — BP 132/94 | HR 92 | Ht 72.0 in | Wt 157.0 lb

## 2019-11-07 DIAGNOSIS — E7800 Pure hypercholesterolemia, unspecified: Secondary | ICD-10-CM | POA: Diagnosis not present

## 2019-11-07 DIAGNOSIS — I2510 Atherosclerotic heart disease of native coronary artery without angina pectoris: Secondary | ICD-10-CM | POA: Diagnosis not present

## 2019-11-07 NOTE — Progress Notes (Signed)
Chief Complaint  Patient presents with  . Follow-up    CAD   History of Present Illness: 64 yo male with history of CAD, OSA, hyperlipidemia here today for cardiac follow up. 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 In December 2009 he had a non-ST elevation MI treated with drug-eluting stents in the circumflex and LAD. He had a follow-up catheterization in February 2010 which showed nonobstructive disease. He was seen in our office May 2017 and he was having fatigue with low BP and chest burning while mowing the grass. Nuclear stress test 05/29/15 with no ischemia. Toprol was stopped and his fatigue resolved. He was seen in our office in July 2020 and had c/o chest pain. Nuclear stress test August 2020 with no ischemia.   He is here today for follow up. The patient denies any chest pain, dyspnea, palpitations, lower extremity edema, orthopnea, PND, dizziness, near syncope or syncope.    Primary Care Physician: Marty Heck, DO  Past Medical History:  Diagnosis Date  . Back pain   . Coronary [place]    [place] [address] s/p DES to RCA. b. NSTEMI 12/2007 s/p DES to Cx and LAD.  Marland Kitchen GERD (gastroesophageal reflux disease)   . Hyperlipidemia   . MYOCARDIAL INFARCTION 06/29/2008   Qualifier: History of  By: Quentin Cornwall CMA, Janett Billow      Past Surgical History:  Procedure Laterality Date  . TONSILLECTOMY      Current Outpatient Medications  Medication Sig Dispense Refill  . acetaminophen (TYLENOL) 500 MG tablet Take 500 mg by mouth every 4 (four) hours as needed for mild pain.    . cetirizine (ZYRTEC ALLERGY) 10 MG tablet Take 1 tablet (10 mg total) by mouth daily. 20 tablet 1  . Cholecalciferol (VITAMIN D3) 400 units CAPS Take 2,000 Units by mouth daily.     . clobetasol (TEMOVATE) 0.05 % external solution Apply 1 application topically 2 (two) times daily. 50 mL 0  . clopidogrel (PLAVIX) 75 MG tablet TAKE 1 TABLET(75 MG) BY  MOUTH DAILY 90 tablet 1  . Clotrimazole 1 % OINT Apply 1 Dose topically 2 (two) times daily. 56 g 0  . hydrOXYzine (ATARAX/VISTARIL) 10 MG tablet Take 1 tablet (10 mg total) by mouth 2 (two) times daily as needed for itching. 8 tablet 1  . nitroGLYCERIN (NITROSTAT) 0.4 MG SL tablet Place 1 tablet (0.4 mg total) under the tongue every 5 (five) minutes as needed for chest pain (Call 911 at 3rd dose within 15 minutes). 25 tablet 5  . NON FORMULARY Take 1 tablet by mouth daily. Al Ajwa seeds    . OVER THE COUNTER MEDICATION Neem Leaf--healthy skin and digestion    . pantoprazole (PROTONIX) 40 MG tablet Take 1 tablet (40 mg total) by mouth daily. 30 tablet 5  . rosuvastatin (CRESTOR) 40 MG tablet Take 1 tablet (40 mg total) by mouth daily. 90 tablet 3  . Saw Palmetto 450 MG CAPS Take 450 mg by mouth as needed (as a health supplement).     . triamcinolone ointment (KENALOG) 0.5 % Apply 1 application topically 2 (two) times daily as needed. 30 g 0  . Turmeric Curcumin 500 MG CAPS Take 1 tablet by mouth daily.     No current facility-administered medications for this visit.    Allergies  Allergen Reactions  . Amoxicillin     REACTION: unspecified REACTION: unspecified  . Citalopram Hydrobromide  Other reaction(s): DROWSINESS Other reaction(s): DROWSINESS  . Clarithromycin     Other reaction(s): DIARRHEA Other reaction(s): DIARRHEA  . Guaifenesin     Other reaction(s): HEADACHE Other reaction(s): HEADACHE  . Penicillins     Other reaction(s): SWELLING Other reaction(s): SWELLING  . Streptomycin     Other reaction(s): CHOKES Other reaction(s): CHOKES    Social History   Socioeconomic History  . Marital status: Married    Spouse name: Not on file  . Number of children: Not on file  . Years of education: Not on file  . Highest education level: Not on file  Occupational History  . Not on file  Tobacco Use  . Smoking status: Never Smoker  . Smokeless tobacco: Never Used  Vaping  Use  . Vaping Use: Never used  Substance and Sexual Activity  . Alcohol use: No    Alcohol/week: 0.0 standard drinks  . Drug use: No  . Sexual activity: Not on file  Other Topics Concern  . Not on file  Social History Narrative  . Not on file   Social Determinants of Health   Financial Resource Strain:   . Difficulty of Paying Living Expenses: Not on file  Food Insecurity:   . Worried About Charity fundraiser in the Last Year: Not on file  . Ran Out of Food in the Last Year: Not on file  Transportation Needs:   . Lack of Transportation (Medical): Not on file  . Lack of Transportation (Non-Medical): Not on file  Physical Activity:   . Days of Exercise per Week: Not on file  . Minutes of Exercise per Session: Not on file  Stress:   . Feeling of Stress : Not on file  Social Connections:   . Frequency of Communication with Friends and Family: Not on file  . Frequency of Social Gatherings with Friends and Family: Not on file  . Attends Religious Services: Not on file  . Active Member of Clubs or Organizations: Not on file  . Attends Archivist Meetings: Not on file  . Marital Status: Not on file  Intimate Partner Violence:   . Fear of Current or Ex-Partner: Not on file  . Emotionally Abused: Not on file  . Physically Abused: Not on file  . Sexually Abused: Not on file    Family History  Problem Relation Age of Onset  . Coronary artery disease Mother   . Hypertension Father   . Heart attack Paternal Grandfather     Review of Systems:  As stated in the HPI and otherwise negative.   There were no vitals taken for this visit.  Physical Examination:  General: Well developed, well nourished, NAD  HEENT: OP clear, mucus membranes moist  SKIN: warm, dry. No rashes. Neuro: No focal deficits  Musculoskeletal: Muscle strength 5/5 all ext  Psychiatric: Mood and affect normal  Neck: No JVD, no carotid bruits, no thyromegaly, no lymphadenopathy.  Lungs:Clear  bilaterally, no wheezes, rhonci, crackles Cardiovascular: Regular rate and rhythm. No murmurs, gallops or rubs. Abdomen:Soft. Bowel sounds present. Non-tender.  Extremities: No lower extremity edema. Pulses are 2 + in the bilateral DP/PT.  EKG:  EKG is ordered today. The ekg ordered today demonstrates . sinus  Recent Labs: 11/09/2018: BUN 13; Creatinine, Ser 1.01; Potassium 4.3; Sodium 139; TSH 1.940 05/03/2019: Hemoglobin 14.6; Platelets 228   Lipid Panel    Component Value Date/Time   CHOL 171 04/12/2019 1139   TRIG 130 04/12/2019 1139   HDL 62  04/12/2019 1139   CHOLHDL 2.8 04/12/2019 1139   CHOLHDL 4 04/18/2014 1613   VLDL 18.8 04/18/2014 1613   LDLCALC 86 04/12/2019 1139   LDLDIRECT 146.2 11/20/2009 0907   LABVLDL 23 04/12/2019 1139     Wt Readings from Last 3 Encounters:  09/15/19 157 lb 1.6 oz (71.3 kg)  09/11/19 157 lb 12.8 oz (71.6 kg)  05/03/19 156 lb 12.8 oz (71.1 kg)     Other studies Reviewed: Additional studies/ records that were reviewed today include:  Review of the above records demonstrates:   Assessment and Plan:   1. CAD without angina: No chest pain. Nuclear stress test August 2020 with no ischemia. Continue Plavix and statin. No beta blocker due to fatigue while on Toprol.     2. Hyperlipidemia: His lipids are followed in primary care. LDL near goal in 2021. Will continue statin.   Current medicines are reviewed at length with the patient today.  The patient does not have concerns regarding medicines.  The following changes have been made:  no change  Labs/ tests ordered today include:   No orders of the defined types were placed in this encounter.   Disposition:   FU with me in 12  months  Signed, Lauree Chandler, MD 11/07/2019 3:43 PM    [facility] [address], [place], [place]  [address] Phone: [phone]; Fax: [phone]

## 2019-11-07 NOTE — Patient Instructions (Signed)

## 2019-11-24 ENCOUNTER — Telehealth: Payer: Self-pay

## 2019-11-24 NOTE — Telephone Encounter (Signed)
Pt needs a letter to present at borders while traveling, should read as :  [place], Mobeetie, dob November 27, 1955,  HAS BEEN FULLY VACCINATED FOR COVID RECEIVED MODERNA: 1ST DOSE 02/23/2019 AT [facility] [place] 2ND DOSE 03/28/2019 AT [facility] [place] 11/03/2019 AT [facility] [place].  THIS WILL NEED TO BE SIGNED AND HE REQUESTS NOTARIZED IF POSSIBLE  WILL CALL PT WHEN READY

## 2019-11-24 NOTE — Telephone Encounter (Signed)
Pls contact pt regarding traveling [phone]

## 2019-11-28 NOTE — Telephone Encounter (Signed)
Pt would like to speak with a nurse about getting a letter for traveling. Please call pt back.

## 2019-11-29 NOTE — Telephone Encounter (Signed)
Hey,  Just want to follow-up that someone has been able to take care of this in clinic?

## 2019-11-30 ENCOUNTER — Other Ambulatory Visit: Payer: Self-pay | Admitting: *Deleted

## 2019-11-30 ENCOUNTER — Encounter: Payer: Self-pay | Admitting: Internal Medicine

## 2019-11-30 DIAGNOSIS — K219 Gastro-esophageal reflux disease without esophagitis: Secondary | ICD-10-CM

## 2019-11-30 MED ORDER — PANTOPRAZOLE SODIUM 40 MG PO TBEC: 40.0000 mg | DELAYED_RELEASE_TABLET | Freq: Every day | ORAL | 1 refills | Status: DC

## 2019-11-30 NOTE — Telephone Encounter (Signed)
Hello,  I created the note and signed it. He can come get it when he is available. Thank you,  Lonia Skinner

## 2019-11-30 NOTE — Telephone Encounter (Signed)
Will pick up letters 11/19

## 2019-12-04 ENCOUNTER — Other Ambulatory Visit: Payer: BC Managed Care – PPO

## 2019-12-05 ENCOUNTER — Other Ambulatory Visit: Payer: BC Managed Care – PPO

## 2019-12-09 ENCOUNTER — Other Ambulatory Visit: Payer: BC Managed Care – PPO

## 2019-12-10 ENCOUNTER — Telehealth: Payer: Self-pay | Admitting: Internal Medicine

## 2019-12-10 ENCOUNTER — Other Ambulatory Visit: Payer: Self-pay | Admitting: Cardiovascular Disease

## 2019-12-10 NOTE — Telephone Encounter (Signed)
Received page for patient call back during on-call hours. Attempted to callx2. No answer.

## 2019-12-26 NOTE — Progress Notes (Signed)
° °  Covid-19 Vaccination Clinic  Name:  Orange Hilligoss    MRN: [id number] DOB: 1955-06-16  12/26/2019  Mr. Arreguin was observed post Covid-19 immunization for 15 minutes without incident. He was provided with Vaccine Information Sheet and instruction to access the V-Safe system.   Mr. Granholm was instructed to call 911 with any severe reactions post vaccine:  Difficulty breathing   Swelling of face and throat   A fast heartbeat   A bad rash all over body   Dizziness and weakness   Immunizations Administered    No immunizations on file.

## 2020-02-02 ENCOUNTER — Other Ambulatory Visit: Payer: BC Managed Care – PPO

## 2020-02-14 ENCOUNTER — Ambulatory Visit: Payer: BC Managed Care – PPO | Admitting: Student

## 2020-02-14 ENCOUNTER — Other Ambulatory Visit: Payer: Self-pay

## 2020-02-14 ENCOUNTER — Encounter: Payer: Self-pay | Admitting: Student

## 2020-02-14 VITALS — BP 136/96 | HR 93 | Temp 98.1°F | Ht 72.0 in | Wt 151.9 lb

## 2020-02-14 DIAGNOSIS — Z0000 Encounter for general adult medical examination without abnormal findings: Secondary | ICD-10-CM

## 2020-02-14 DIAGNOSIS — R42 Dizziness and giddiness: Secondary | ICD-10-CM | POA: Diagnosis not present

## 2020-02-14 MED ORDER — CLOPIDOGREL BISULFATE 75 MG PO TABS
ORAL_TABLET | ORAL | 1 refills | Status: DC
Start: 2020-02-14 — End: 2020-09-03

## 2020-02-14 NOTE — Progress Notes (Signed)
   CC: Dizziness upon standing  HPI:  [name] is a 65 y.o. with past medical history significant for CAD (s/p DES to RCA, LAD and Cx), GERD, HLD who presents to clinic for evaluation of episode of dizziness upon standing. Refer to problem list for charting of this encounter.  Past Medical History:  Diagnosis Date  . Back pain   . Coronary artery [place]    [place] [address] s/p DES to RCA. b. NSTEMI 12/2007 s/p DES to Cx and LAD.  Marland Kitchen GERD (gastroesophageal reflux disease)   . Hyperlipidemia   . MYOCARDIAL INFARCTION 06/29/2008   Qualifier: History of  By: Quentin Cornwall CMA, Janett Billow     Review of Systems:  Endorses episode of dizziness upon standing. Denies palpitations, nausea, vomiting, headaches, chest pain, shortness of breath, syncope.  Physical Exam:  Vitals:   02/14/20 0907 02/14/20 0916  BP: (!) 151/96 (!) 136/96  Pulse: 96 93  Temp: 98.1 F (36.7 C)   TempSrc: Oral   SpO2: 100%   Weight: 151 lb 14.4 oz (68.9 kg)   Height: 6' (1.829 m)    Physical Exam Vitals reviewed.  Constitutional:      General: He is not in acute distress.    Appearance: Normal appearance. He is normal weight. He is not ill-appearing.  HENT:     Head: Normocephalic and atraumatic.     Mouth/Throat:     Mouth: Mucous membranes are moist.     Pharynx: Oropharynx is clear.  Eyes:     Extraocular Movements: Extraocular movements intact.     Conjunctiva/sclera: Conjunctivae normal.  Cardiovascular:     Rate and Rhythm: Normal rate and regular rhythm.     Pulses: Normal pulses.     Heart sounds: Normal heart sounds. No murmur heard.   Pulmonary:     Effort: Pulmonary effort is normal. No respiratory distress.     Breath sounds: Normal breath sounds.  Abdominal:     General: Abdomen is flat. Bowel sounds are normal.     Palpations: Abdomen is soft.     Tenderness: There is no abdominal tenderness.  Skin:    General: Skin is warm and dry.     Capillary Refill: Capillary refill takes  less than 2 seconds.     Comments: Normal skin turgor  Neurological:     General: No focal deficit present.     Mental Status: He is alert. Mental status is at baseline.  Psychiatric:        Mood and Affect: Mood normal.        Behavior: Behavior normal.    Assessment & Plan:   See Encounters Tab for problem based charting.  Patient discussed with Dr. Heber Avon

## 2020-02-14 NOTE — Patient Instructions (Addendum)
[name],  It was a pleasure meeting you in clinic this morning.  For your episode of dizziness upon standing: Your description of your symptoms is most concerning for a condition known as orthostatic hypotension which may often be seen in the setting of dehydration. We performed orthostatic vital signs in clinic today which were normal. We would like to obtain a complete blood count and basic metabolic panel (lab draws) today to evaluate for any other acute abnormality such as electrolyte imbalance or anemia which may have contributed to your symptoms. I will call you with the results of these lab tests upon receiving them. If these lab results return as unrevealing, I would be reassured. Continue to stay hydrated. Safe travels to visit your mother. We look forward to seeing you again in clinic.  Sincerely, Dr. Paulla Dolly, MD

## 2020-02-14 NOTE — Assessment & Plan Note (Addendum)
Patient reports an episode of dizziness upon standing from a chair three to four days ago. He states that following standing, he had the sensation of his head spinning and had to sit back down. He denies associated nausea, vomiting, palpitations, chest pain, shortness of breath, loss of conciousness. He states that prior to this episode, patient had taken two tablets of Excedrin and had not been drinking much fluid throughout the day. As this episode occurred late in the afternoon, patient went to bed and woke up feeling better. He endorses a similar episode occurring several years in the past. He denies ongoing symptoms since this episode.  OBJECTIVE: 136/96, HR 93. Orthostatics negative. Physical exam: normal rate, regular rhythm, strong pulses, <2 second capillary refill, normal skin turgor.  Assessment/Plan: Patient's episode of dizziness on standing is most consistent with orthostatic hypotension which has since resolved. Patient denies ongoing symptoms. Will obtain CBC and BMP to rule out electrolyte derangement, AKI and anemia. -Follow-up CBC -Follow-up BMP -Encouraged appropriate hydration

## 2020-02-14 NOTE — Assessment & Plan Note (Signed)
Patient is due for repeat fecal occult blood test for colon cancer screening. -Ordered FIT test

## 2020-02-15 LAB — BMP8+ANION GAP
Anion Gap: 15 mmol/L (ref 10.0–18.0)
BUN/Creatinine Ratio: 11 (ref 10–24)
BUN: 11 mg/dL (ref 8–27)
CO2: 23 mmol/L (ref 20–29)
Calcium: 10.1 mg/dL (ref 8.6–10.2)
Chloride: 102 mmol/L (ref 96–106)
Creatinine, Ser: 0.99 mg/dL (ref 0.76–1.27)
GFR calc Af Amer: 93 mL/min/{1.73_m2} (ref >59)
GFR calc non Af Amer: 80 mL/min/{1.73_m2} (ref >59)
Glucose: 80 mg/dL (ref 65–99)
Potassium: 4.1 mmol/L (ref 3.5–5.2)
Sodium: 140 mmol/L (ref 134–144)

## 2020-02-15 LAB — CBC
Hematocrit: 45.5 % (ref 37.5–51.0)
Hemoglobin: 15.2 g/dL (ref 13.0–17.7)
MCH: 29.9 pg (ref 26.6–33.0)
MCHC: 33.4 g/dL (ref 31.5–35.7)
MCV: 90 fL (ref 79–97)
Platelets: 250 10*3/uL (ref 150–450)
RBC: 5.08 x10E6/uL (ref 4.14–5.80)
RDW: 12.6 % (ref 11.6–15.4)
WBC: 6.1 10*3/uL (ref 3.4–10.8)

## 2020-02-19 NOTE — Progress Notes (Signed)
Internal Medicine Clinic Attending  Case discussed with Dr. Johnson  At the time of the visit.  We reviewed the resident's history and exam and pertinent patient test results.  I agree with the assessment, diagnosis, and plan of care documented in the resident's note.  

## 2020-02-29 ENCOUNTER — Ambulatory Visit: Payer: BC Managed Care – PPO | Admitting: Cardiovascular Disease

## 2020-03-05 ENCOUNTER — Ambulatory Visit (INDEPENDENT_AMBULATORY_CARE_PROVIDER_SITE_OTHER): Payer: BC Managed Care – PPO | Admitting: Internal Medicine

## 2020-03-05 ENCOUNTER — Telehealth: Payer: Self-pay | Admitting: *Deleted

## 2020-03-05 ENCOUNTER — Other Ambulatory Visit: Payer: Self-pay

## 2020-03-05 DIAGNOSIS — J069 Acute upper respiratory infection, unspecified: Secondary | ICD-10-CM | POA: Diagnosis not present

## 2020-03-05 MED ORDER — BENZONATATE 100 MG PO CAPS: 100.0000 mg | ORAL_CAPSULE | Freq: Three times a day (TID) | ORAL | 0 refills | Status: DC | PRN

## 2020-03-05 NOTE — Progress Notes (Signed)
  [facility] Internal Medicine Residency Telephone Encounter Continuity Care Appointment  HPI:   This telephone encounter was created for [name] on 03/05/2020 for the following purpose/cc:non-productive cough  Patient states he has just returned from [place] where he was visiting his mother. In the past 5-6 days, he has developed a non-productive cough. He went to see a friend doctor Systems developer) in [place] who diagnosed him with acute bronchitis; treatment was antibiotics (Azithromycin) and anti-histamines. He denies any fever, congestion, headache, shortness of breathe, chest pain. He is having left ear pain for the past 3 days but this is better today. He was tested via PCR for COVID-19 on the 20th, and that was negative. Prior to that test, he had rapid tests done at home that were also negative. He tried Nyquil for symptomatic relief so far.    Past Medical History:  Past Medical History:  Diagnosis Date  . Back pain   . Coronary [place]    [place] [address] s/p DES to RCA. b. NSTEMI 12/2007 s/p DES to Cx and LAD.  Marland Kitchen GERD (gastroesophageal reflux disease)   . Hyperlipidemia   . MYOCARDIAL INFARCTION 06/29/2008   Qualifier: History of  By: Quentin Cornwall CMA, Jessica        ROS:   + cough  - fever, chills, congestion, SOB, chest pain   Assessment / Plan / Recommendations:   Please see A&P under problem oriented charting for assessment of the patient's acute and chronic medical conditions.   As always, pt is advised that if symptoms worsen or new symptoms arise, they should go to an urgent care facility or to to ER for further evaluation.   Consent and Medical Decision Making:   Patient discussed with Dr. Daryll Drown  This is a telephone encounter between [name] and Jose Persia on 03/05/2020 for cough. The visit was conducted with the patient located at home and Jose Persia at [facility]. The patient's identity was confirmed using their DOB and current address.  The patient has consented to being evaluated through a telephone encounter and understands the associated risks (an examination cannot be done and the patient may need to come in for an appointment) / benefits (allows the patient to remain at home, decreasing exposure to coronavirus). I personally spent 20 minutes on medical discussion.

## 2020-03-05 NOTE — Telephone Encounter (Addendum)
Patient called in requesting an appt for 5-7 day hx of "chest cough." States it's only productive "once in a blue moon." Also, c/o "sinus issues," and left earache. Denies SHOB, body aches, chills/fever. Has received booster for Covid. Tele appt given this PM with Yellow Team as Red Team is full.

## 2020-03-05 NOTE — Assessment & Plan Note (Addendum)
History most consistent with viral URI versus acute viral bronchitis. Patient does not endorse shortness of breathe, chest pain. Due to this, no indication for chest imaging. Will treat symptomatically with Tessalon pearls.   - Tessalon Pearls 100 mg TID PRN - Follow up in next 5-10 days if no improvement.

## 2020-03-07 NOTE — Progress Notes (Signed)
Internal Medicine Clinic Attending  Case discussed with Dr. Basaraba  At the time of the visit.  We reviewed the resident's history and pertinent patient test results.  I agree with the assessment, diagnosis, and plan of care documented in the resident's note.  

## 2020-03-08 ENCOUNTER — Telehealth: Payer: Self-pay | Admitting: Internal Medicine

## 2020-03-08 NOTE — Telephone Encounter (Signed)
RTC, patient states both his ears are popping now, he feels it more when he swallows.  States his cough is better and not as frequent since he started the [facility], but his cough is still deep.  RN recommended pt come in for an in-patient evaluation to r/o ear infection.  Telehealth appt was 03/05/20 with instructions to f/u in next 5-10 days if no improvement. Appt made for 03/12/20 @ 3:45 w/ Dr. Sharon Seller.   SChaplin, RN,BSN

## 2020-03-08 NOTE — Telephone Encounter (Signed)
Pt calling to report both ears are popping a lot and is requesting a call back. Pt also wants to know about medication for his ears.

## 2020-03-11 NOTE — Telephone Encounter (Signed)
Agree with plan, thanks

## 2020-03-12 ENCOUNTER — Ambulatory Visit: Payer: BC Managed Care – PPO | Admitting: Internal Medicine

## 2020-03-12 ENCOUNTER — Encounter: Payer: Self-pay | Admitting: Internal Medicine

## 2020-03-12 ENCOUNTER — Other Ambulatory Visit: Payer: Self-pay

## 2020-03-12 VITALS — BP 142/99 | HR 112 | Temp 98.2°F | Ht 72.0 in | Wt 148.5 lb

## 2020-03-12 DIAGNOSIS — R42 Dizziness and giddiness: Secondary | ICD-10-CM

## 2020-03-12 DIAGNOSIS — R000 Tachycardia, unspecified: Secondary | ICD-10-CM

## 2020-03-12 DIAGNOSIS — R059 Cough, unspecified: Secondary | ICD-10-CM | POA: Diagnosis not present

## 2020-03-12 LAB — D-DIMER, QUANTITATIVE: D-Dimer, Quant: 0.58 ug/mL-FEU — ABNORMAL HIGH (ref 0.00–0.50)

## 2020-03-12 MED ORDER — FLONASE SENSIMIST 27.5 MCG/SPRAY NA SUSP: 2.0000 | Freq: Every day | NASAL | 12 refills | Status: DC

## 2020-03-12 NOTE — Progress Notes (Signed)
   CC: ears popping  HPI:  [name] is a 65 y.o. with PMH as below.   Please see A&P for assessment of the patient's acute and chronic medical conditions.   Ears popping   Dry cough since 2/14 that has now had some white sputum. Feels like the cough is deep. First started when he was in [place] where he went to visit his mother. While there he was diagnosed with likely bronchitis and was prescribed azithromycin but cough has not improved. It is worse with talking. Eating or time of day does not affect the cough. No SOB, fever, chills, sinus pain, or sore throat. He does have nasal congestion but is not sure about post-nasal drip. He also has had dizziness a couple times but none today or yesterday. He has been having ear popping that is bothering him but no ear pain or change in hearing. He tried tessalon perles, which did not help.   Past Medical History:  Diagnosis Date  . Back pain   . Coronary artery disease    [place] [address] s/p DES to RCA. b. NSTEMI 12/2007 s/p DES to Cx and LAD.  Marland Kitchen GERD (gastroesophageal reflux disease)   . Hyperlipidemia   . MYOCARDIAL INFARCTION 06/29/2008   Qualifier: History of  By: Quentin Cornwall CMA, Janett Billow     Review of Systems:   10 point ROS negative except as noted in HPI  Physical Exam: Constitution: NAD, appears stated age, well appearing HENT: unable to visual TM due to wax, no tenderness around the ears, no cervical lymphadenopathy or tenderness, no pharyngeal erythema, +nasal congestion Eyes: no icterus or injection  Cardio: tachycardic, no m/r/g, no LE edema  Respiratory: CTA, no w/r/r, non-labored breathing, +cough MSK: moving all extremities Neuro: normal affect, a&ox3 Skin: c/d/i    Vitals:   03/12/20 1542  BP: (!) 142/99  Pulse: (!) 112  Temp: 98.2 F (36.8 C)  TempSrc: Oral  SpO2: 99%  Weight: 148 lb 8 oz (67.4 kg)  Height: 6' (1.829 m)     Assessment & Plan:   See Encounters Tab for problem based  charting.  Patient discussed with Dr. Philipp Ovens

## 2020-03-12 NOTE — Patient Instructions (Addendum)
Thank you for allowing [place] to provide your care today. Today we discussed your cough  I have ordered the following labs for you:  Basic metabolic panel, d-dimer, blood count    I will call if any are abnormal.    Today we made the following changes to your medications:   Please START taking  Flonase - spray one puff in each nostril two times per day for two weeks.   Please buy ear drops to help soften the wax in your ears.   Please follow-up in one week if symptoms do not improve. If you develop fever, chills, dizziness, shortness of breath please return to the clinic sooner.    Please call the internal medicine center clinic if you have any questions or concerns, we may be able to help and keep you from a long and expensive emergency room wait. Our clinic and after hours phone number is [phone], the best time to call is Monday through Friday 9 am to 4 pm but there is always someone available 24/7 if you have an emergency. If you need medication refills please notify your pharmacy one week in advance and they will send [place] a request.

## 2020-03-13 ENCOUNTER — Other Ambulatory Visit: Payer: BC Managed Care – PPO

## 2020-03-13 ENCOUNTER — Ambulatory Visit (HOSPITAL_COMMUNITY)
Admission: RE | Admit: 2020-03-13 | Discharge: 2020-03-13 | Disposition: A | Discharge status: Home / Self Care | Payer: BC Managed Care – PPO | Source: Ambulatory Visit | Attending: Internal Medicine | Admitting: Internal Medicine

## 2020-03-13 ENCOUNTER — Other Ambulatory Visit: Payer: Self-pay

## 2020-03-13 DIAGNOSIS — R000 Tachycardia, unspecified: Secondary | ICD-10-CM | POA: Diagnosis not present

## 2020-03-13 DIAGNOSIS — R059 Cough, unspecified: Secondary | ICD-10-CM | POA: Diagnosis present

## 2020-03-13 DIAGNOSIS — R42 Dizziness and giddiness: Secondary | ICD-10-CM | POA: Diagnosis present

## 2020-03-13 DIAGNOSIS — Z0000 Encounter for general adult medical examination without abnormal findings: Secondary | ICD-10-CM

## 2020-03-13 LAB — CBC WITH DIFFERENTIAL/PLATELET
Basophils Absolute: 0.1 10*3/uL (ref 0.0–0.2)
Basos: 1 %
EOS (ABSOLUTE): 0.2 10*3/uL (ref 0.0–0.4)
Eos: 2 %
Hematocrit: 43.3 % (ref 37.5–51.0)
Hemoglobin: 14.7 g/dL (ref 13.0–17.7)
Immature Grans (Abs): 0 10*3/uL (ref 0.0–0.1)
Immature Granulocytes: 0 %
Lymphocytes Absolute: 1.7 10*3/uL (ref 0.7–3.1)
Lymphs: 25 %
MCH: 30.1 pg (ref 26.6–33.0)
MCHC: 33.9 g/dL (ref 31.5–35.7)
MCV: 89 fL (ref 79–97)
Monocytes Absolute: 0.9 10*3/uL (ref 0.1–0.9)
Monocytes: 14 %
Neutrophils Absolute: 3.9 10*3/uL (ref 1.4–7.0)
Neutrophils: 58 %
Platelets: 312 10*3/uL (ref 150–450)
RBC: 4.88 x10E6/uL (ref 4.14–5.80)
RDW: 12.6 % (ref 11.6–15.4)
WBC: 6.8 10*3/uL (ref 3.4–10.8)

## 2020-03-13 LAB — CMP14 + ANION GAP
ALT: 29 IU/L (ref 0–44)
AST: 26 IU/L (ref 0–40)
Albumin/Globulin Ratio: 2.3 — ABNORMAL HIGH (ref 1.2–2.2)
Albumin: 5.1 g/dL — ABNORMAL HIGH (ref 3.8–4.8)
Alkaline Phosphatase: 63 IU/L (ref 44–121)
Anion Gap: 15 mmol/L (ref 10.0–18.0)
BUN/Creatinine Ratio: 13 (ref 10–24)
BUN: 13 mg/dL (ref 8–27)
Bilirubin Total: 0.3 mg/dL (ref 0.0–1.2)
CO2: 24 mmol/L (ref 20–29)
Calcium: 10 mg/dL (ref 8.6–10.2)
Chloride: 103 mmol/L (ref 96–106)
Creatinine, Ser: 0.99 mg/dL (ref 0.76–1.27)
Globulin, Total: 2.2 g/dL (ref 1.5–4.5)
Glucose: 100 mg/dL — ABNORMAL HIGH (ref 65–99)
Potassium: 4.7 mmol/L (ref 3.5–5.2)
Sodium: 142 mmol/L (ref 134–144)
Total Protein: 7.3 g/dL (ref 6.0–8.5)
eGFR: 85 mL/min/{1.73_m2} (ref >59)

## 2020-03-13 NOTE — Assessment & Plan Note (Addendum)
Dry cough since 2/14 that has now had some white sputum. Feels like the cough is deep. First started when he was in [place] where he went to visit his mother. While there he was diagnosed with likely bronchitis and was prescribed azithromycin but cough has not improved. It is worse with talking. Eating or time of day does not affect the cough. No SOB, fever, chills, sinus pain, or sore throat. He does have nasal congestion but is not sure about post-nasal drip. He also has had dizziness a couple times but none today or yesterday. He has been having ear popping that is bothering him but no ear pain or change in hearing. He tried tessalon perles, which did not help.  Initially tachycardic to 112 on arrival. Currently 96. Lungs clear, no cervical lympadenopathy or erythema of the throat. Orthostatics done. Laying to sitting 268 > 341 systolic although on standing he increased to 962 systolic and was asymptomatic the entire time. Unable to visualize his TM due to wax. We did try irrigation but he was unable to tolerate this as he developed dizziness.  This is most likely viral URI with vestibular neuritis but with tachycardia and intermittent dizziness and recent travel will obtain d-dimer, CBC, CMP and cxr.   - encouraged to drink lots of fluids - CBC, CMP, d-dimer - chest xr  - flonase bid for two weeks - f/u in one week or if symptoms worsen  ADDENDUM: left voicemail to call back for results. D-dimer very slightly elevated. Will need to discuss possible CT scan.

## 2020-03-13 NOTE — Progress Notes (Signed)
Internal Medicine Clinic Attending  Case discussed with Dr. Seawell  At the time of the visit.  We reviewed the resident's history and exam and pertinent patient test results.  I agree with the assessment, diagnosis, and plan of care documented in the resident's note.  

## 2020-03-14 ENCOUNTER — Other Ambulatory Visit: Payer: Self-pay

## 2020-03-14 ENCOUNTER — Encounter: Payer: Self-pay | Admitting: Internal Medicine

## 2020-03-14 ENCOUNTER — Ambulatory Visit (INDEPENDENT_AMBULATORY_CARE_PROVIDER_SITE_OTHER): Payer: BC Managed Care – PPO | Admitting: Internal Medicine

## 2020-03-14 VITALS — BP 128/94 | HR 86

## 2020-03-14 DIAGNOSIS — R059 Cough, unspecified: Secondary | ICD-10-CM | POA: Diagnosis not present

## 2020-03-14 LAB — FECAL OCCULT BLOOD, IMMUNOCHEMICAL: Fecal Occult Bld: NEGATIVE

## 2020-03-14 NOTE — Progress Notes (Signed)
   CC: cough  HPI:  [name] is a 65 y.o. with PMH as below.   Please see A&P for assessment of the patient's acute and chronic medical conditions.    Past Medical History:  Diagnosis Date  . Back pain   . Coronary artery disease    [place] [address] s/p DES to RCA. b. NSTEMI 12/2007 s/p DES to Cx and LAD.  Marland Kitchen GERD (gastroesophageal reflux disease)   . Hyperlipidemia   . MYOCARDIAL INFARCTION 06/29/2008   Qualifier: History of  By: Quentin Cornwall CMA, Jessica     Review of Systems:   Review of Systems  HENT: Negative for congestion, ear pain, sinus pain and sore throat.   Respiratory: Positive for cough. Negative for hemoptysis, sputum production, shortness of breath and wheezing.   Cardiovascular: Negative for chest pain, palpitations and leg swelling.  Neurological: Negative for dizziness, focal weakness, weakness and headaches.    Physical Exam:   Constitution: NAD, appears stated age 11: RRR, no m/r/g, no LE edema  Respiratory: nonlabored breathing, on room air MSK: moving all extremities Neuro: normal affect, a&ox3 Skin: c/d/i    Vitals:   03/14/20 1359 03/14/20 1403  BP: 132/85 (!) 128/94  Pulse: 86 86  SpO2: 100%      Assessment & Plan:   See Encounters Tab for problem based charting.  Patient discussed with Dr. Rebeca Alert

## 2020-03-14 NOTE — Assessment & Plan Note (Signed)
Patient here for follow-up for cough. It has significantly improved since starting flonase two days ago, and he is feeling much better. No recurrence of dizziness. No ear pain and popping of ears has improved.  Initially there was some concern for PE as patient recently had a 10 hour flight and HR was 112 on his visit. Wells score was 1.5. D-dimer was mildly elevated at .58, which is likely normal for age. CXR normal. HR today is 84. As cough as improved with flonase, will continue treatment for post-nasal drip induced cough.   - cont. flonase for two weeks - f/u if cough recurs

## 2020-03-14 NOTE — Patient Instructions (Signed)
Thank you for allowing [place] to provide your care today. Today we discussed your cough.  I am glad your cough has improved. Please continue to use the flonase for the next two weeks.   Please follow-up in six months.     Please call the internal medicine center clinic if you have any questions or concerns, we may be able to help and keep you from a long and expensive emergency room wait. Our clinic and after hours phone number is [phone], the best time to call is Monday through Friday 9 am to 4 pm but there is always someone available 24/7 if you have an emergency. If you need medication refills please notify your pharmacy one week in advance and they will send [place] a request.

## 2020-03-17 ENCOUNTER — Other Ambulatory Visit: Payer: Self-pay | Admitting: Internal Medicine

## 2020-03-17 DIAGNOSIS — E782 Mixed hyperlipidemia: Secondary | ICD-10-CM

## 2020-03-19 NOTE — Progress Notes (Signed)
Internal Medicine Clinic Attending  Case discussed with Dr. Seawell at the time of the visit.  We reviewed the resident's history and exam and pertinent patient test results.  I agree with the assessment, diagnosis, and plan of care documented in the resident's note.  Beyonca Wisz, M.D., Ph.D.  

## 2020-03-22 ENCOUNTER — Telehealth: Payer: Self-pay

## 2020-03-22 NOTE — Telephone Encounter (Signed)
[place] office - can you schedule pt an appt on Monday afternoon.

## 2020-03-22 NOTE — Telephone Encounter (Signed)
Per last office visit note, he was instructed to use the flonase for 2 weeks.  Sending to Dr. Sharon Seller to advise since patient cough is continuing. Thank you, SChaplin, RN,BSN

## 2020-03-22 NOTE — Telephone Encounter (Signed)
Can we schedule him for Monday afternoon by any chance?

## 2020-03-22 NOTE — Telephone Encounter (Signed)
Pt states he still cough, want to know how long should he use the nasal spray. Please call pt back.

## 2020-03-25 ENCOUNTER — Other Ambulatory Visit: Payer: Self-pay

## 2020-03-25 ENCOUNTER — Ambulatory Visit: Payer: BC Managed Care – PPO | Admitting: Internal Medicine

## 2020-03-25 ENCOUNTER — Encounter: Payer: Self-pay | Admitting: Internal Medicine

## 2020-03-25 VITALS — BP 135/93 | HR 83 | Temp 98.1°F | Ht 72.0 in | Wt 148.7 lb

## 2020-03-25 DIAGNOSIS — R059 Cough, unspecified: Secondary | ICD-10-CM | POA: Diagnosis not present

## 2020-03-25 MED ORDER — PSEUDOEPHEDRINE HCL ER 120 MG PO TB12: 120.0000 mg | ORAL_TABLET | Freq: Two times a day (BID) | ORAL | 0 refills | Status: DC | PRN

## 2020-03-25 MED ORDER — LORATADINE 10 MG PO TABS: 10.0000 mg | ORAL_TABLET | Freq: Every day | ORAL | 2 refills | Status: DC

## 2020-03-25 NOTE — Patient Instructions (Signed)
Thank you for allowing [place] to provide your care today. Today we discussed your chronic cough.   I have ordered the following labs for you:    I will call if any are abnormal.    Today we made the following changes to your medications:   Please START taking  Sudafed 120 mg - take 1 tablet every 12 hours for 7 days   Loratadine - take 1 tablet once per day   Please follow-up if symptoms do not improve.    Please call the internal medicine center clinic if you have any questions or concerns, we may be able to help and keep you from a long and expensive emergency room wait. Our clinic and after hours phone number is [phone], the best time to call is Monday through Friday 9 am to 4 pm but there is always someone available 24/7 if you have an emergency. If you need medication refills please notify your pharmacy one week in advance and they will send [place] a request.

## 2020-03-25 NOTE — Assessment & Plan Note (Signed)
Still having persistent cough although it has improved significantly with starting flonase. Seems to be exacerbated with changes in environment or talking and he will suddenly have dry coughing fit. Nasal congestion has improved. Does not worsen with position. No sinus or ear pain. Not exacerbated with food or with laying down. No shortness of breath.  With improvement in symptoms with initiating flonase, upper airway cough syndrome is still highest on the differential. Doubt symptoms related to GERD. Possible cough variant asthma and can consider work-up for this if addition of decongestant and antihistamine do not improve symptoms.   - sudafed 120 mg q12h for 7 days - advised not to take longer, advised on adverse effects - loratidine 10 mg qd  - continue flonase - will f/u in one week with telehealth visit - consider work-up for cough variant asthma if symptoms persist

## 2020-03-25 NOTE — Telephone Encounter (Signed)
Spoke with the patient this morning.  Appt sch for 03/25/2020 @ 2:45 pm with Dr. Sharon Seller.

## 2020-03-25 NOTE — Progress Notes (Signed)
   CC: chronic cough  HPI:  [name] is a 65 y.o. with PMH as below.   Please see A&P for assessment of the patient's acute and chronic medical conditions.   Still having persistent cough although it has improved significantly with starting flonase. Seems to be exacerbated with changes in environment or talking and he will suddenly have dry coughing fit. Nasal congestion has improved. Does not worsen with position. No sinus or ear pain. Not exacerbated with food or with laying down. No shortness of breath.   Past Medical History:  Diagnosis Date  . Back pain   . Coronary artery disease    [place] [address] s/p DES to RCA. b. NSTEMI 12/2007 s/p DES to Cx and LAD.  Marland Kitchen GERD (gastroesophageal reflux disease)   . Hyperlipidemia   . MYOCARDIAL INFARCTION 06/29/2008   Qualifier: History of  By: Quentin Cornwall CMA, Jessica     Review of Systems:   Review of Systems  HENT: Negative for ear discharge, ear pain, hearing loss, nosebleeds, sinus pain and sore throat.   Eyes: Negative for blurred vision and double vision.  Respiratory: Positive for cough. Negative for hemoptysis, sputum production, shortness of breath, wheezing and stridor.   Cardiovascular: Negative for chest pain, palpitations, orthopnea and leg swelling.  Neurological: Negative for dizziness and headaches.   Physical Exam:  Constitution: NAD, appears stated age HENT: [place]/AT, mild erythema pharynx but unable to have complete visualization, unable to view TM with ear wax, no erythema or inflammation of nasal passages  Eyes: no icterus or injection  Cardio: RRR, no m/r/g, no LE edema  Respiratory: CTA, no w/r/r Abdominal: NTTP, soft, non-distended MSK: moving all extremities Neuro: normal affect, a&ox3 Skin: c/d/i    Vitals:   03/25/20 1448  BP: (!) 135/93  Pulse: 83  Temp: 98.1 F (36.7 C)  TempSrc: Oral  SpO2: 100%  Weight: 148 lb 11.2 oz (67.4 kg)  Height: 6' (1.829 m)    Assessment & Plan:   See Encounters  Tab for problem based charting.  Patient discussed with Dr. Jimmye Norman

## 2020-03-26 NOTE — Progress Notes (Signed)
Internal Medicine Clinic Attending  Case discussed with Dr. Sharon Seller  At the time of the visit.  We reviewed the resident's history and exam and pertinent patient test results.  I agree with the assessment, diagnosis, and plan of care documented in the resident's note. Cough suppressant may also be beneficial.

## 2020-03-29 ENCOUNTER — Telehealth: Payer: Self-pay

## 2020-03-29 NOTE — Telephone Encounter (Signed)
RTC to patient, he states a couple of days after taking the Sudafed he started having side effects and he has stopped that medication.  RN asked patient what side effects he experienced and he states "Dr will know because she warned me about it and told me if it happened to stop the medication".  He states his cough is a little bit better, but after he talks for maybe 15 minutes he will start coughing again.  He wanted to let MD know. SChaplin, RN,BSN

## 2020-03-29 NOTE — Telephone Encounter (Signed)
Requesting to speak with a nurse. Please call back.  

## 2020-03-29 NOTE — Telephone Encounter (Signed)
Ok thank you. If he is continuing to have cough next week and would like telehealth visit to discuss further or see what other medications we can try, we can do that.

## 2020-04-02 NOTE — Telephone Encounter (Signed)
04/04/20 appointment w/ Dr. Sharon Seller noted. SChaplin, RN,BSN

## 2020-04-04 ENCOUNTER — Encounter: Payer: Self-pay | Admitting: Internal Medicine

## 2020-04-04 ENCOUNTER — Other Ambulatory Visit: Payer: Self-pay

## 2020-04-04 ENCOUNTER — Ambulatory Visit (INDEPENDENT_AMBULATORY_CARE_PROVIDER_SITE_OTHER): Payer: BC Managed Care – PPO | Admitting: Internal Medicine

## 2020-04-04 VITALS — BP 146/89 | HR 88 | Temp 99.0°F | Wt 150.9 lb

## 2020-04-04 DIAGNOSIS — R059 Cough, unspecified: Secondary | ICD-10-CM

## 2020-04-04 MED ORDER — SALINE SPRAY 0.65 % NA SOLN: 1.0000 | Freq: Every day | NASAL | 0 refills | Status: DC

## 2020-04-04 NOTE — Progress Notes (Signed)
   CC: cough  HPI:  [name] is a 65 y.o. with PMH as below.   Please see A&P for assessment of the patient's acute and chronic medical conditions.   Continuing to have dry cough although now he is starting to have pain on his right chest that wraps around to his back whenever he takes a deep breath and sometimes when he coughs. He is TTP over the right chest wall. It is very mild but starting to bother him. Still no shortness of breath, non-productive. Cough is worse when he goes from one environment to another and sometimes at night. No GERD symptoms. Continues to have nasal congestion, feels like his throat is constantly being tickled. The only thing that has partially helped is flonase. Had urinary hesitancy with sudafed and another AE with claritin so stopped this. He has tolerated zyrtec which he took for a couple of days prior to coming into the clinic. He also was treated with antibiotics when out of the country, which did not help. Recent Xr and labs were normal.  Past Medical History:  Diagnosis Date  . Back pain   . Coronary artery disease    [place] [address] s/p DES to RCA. b. NSTEMI 12/2007 s/p DES to Cx and LAD.  Marland Kitchen GERD (gastroesophageal reflux disease)   . Hyperlipidemia   . MYOCARDIAL INFARCTION 06/29/2008   Qualifier: History of  By: Quentin Cornwall CMA, Janett Billow     Review of Systems:   10 point ROS negative except as noted in HPI  Physical Exam:   Constitution: NAD, appears stated age HENT: swollen nasal turbinates, no pharyngeal erythema or cobblestoning, TM neutral, gray, translucent, no cervical lymphadenopathy or palpable masses Eyes: no injection or watery eyes Cardio: RRR, no m/r/g, no LE edema  Respiratory: CTA, no w/r/r, frequent cough  MSK: moving all extremities, TTP over right chest wall  Neuro: normal affect, a&ox3 Skin: c/d/i    Vitals:   04/04/20 1328  BP: (!) 146/89  Pulse: 88  Temp: 99 F (37.2 C)  TempSrc: Oral  SpO2: 100%  Weight: 150 lb  14.4 oz (68.4 kg)     Assessment & Plan:   See Encounters Tab for problem based charting.  Patient discussed with Dr. Philipp Ovens

## 2020-04-04 NOTE — Patient Instructions (Signed)
Thank you for allowing [place] to provide your care today. Today we discussed your chronic cough.     I have ordered the following labs for you:    I will call if any are abnormal.    Use saline nasal spray prior to using flonase   Please follow-up in six months.    Please call the internal medicine center clinic if you have any questions or concerns, we may be able to help and keep you from a long and expensive emergency room wait. Our clinic and after hours phone number is [phone], the best time to call is Monday through Friday 9 am to 4 pm but there is always someone available 24/7 if you have an emergency. If you need medication refills please notify your pharmacy one week in advance and they will send [place] a request.

## 2020-04-05 NOTE — Assessment & Plan Note (Signed)
Continuing to have dry cough although now he is starting to have pain on his right chest that wraps around to his back whenever he takes a deep breath and sometimes when he coughs. He is TTP over the right chest wall. It is very mild but starting to bother him. Still no shortness of breath, non-productive. Cough is worse when he goes from one environment to another and sometimes at night. No GERD symptoms. Continues to have nasal congestion, feels like his throat is constantly being tickled. The only thing that has partially helped is flonase. Had urinary hesitancy with sudafed and another AE with claritin so stopped this. He has tolerated zyrtec which he took for a couple of days prior to coming into the clinic. He also was treated with antibiotics when out of the country, which did not help. Recent Xr and labs were normal.   - He has not been taking flonase properly. Instructed on proper application of flonase, will add saline spray to use prior to flonase with congestion  - with ongoing symptoms that are interfering with IADLs will refer to pulm - continue zyrtec

## 2020-04-08 NOTE — Progress Notes (Signed)
Internal Medicine Clinic Attending  Case discussed with Dr. Seawell  At the time of the visit.  We reviewed the resident's history and exam and pertinent patient test results.  I agree with the assessment, diagnosis, and plan of care documented in the resident's note.  

## 2020-04-09 ENCOUNTER — Telehealth: Payer: Self-pay

## 2020-04-09 NOTE — Telephone Encounter (Signed)
Pt is requesting a call back. 

## 2020-04-09 NOTE — Telephone Encounter (Signed)
Thank you for letting me know. If his cough has completely resolved, we can cancel the appointment with pulmonology.

## 2020-04-09 NOTE — Telephone Encounter (Signed)
Returned call to patient. States he wanted to let his PCP know that he is feeling better after starting Flonase and nasal saline lavage. Cough has decreased. In fact, he did not cough once during our conversation. He is very [employer]. He was made aware that 12 refills were sent on Flonase and [place] spray is OTC.

## 2020-05-23 ENCOUNTER — Institutional Professional Consult (permissible substitution): Payer: BC Managed Care – PPO | Admitting: Pulmonary Disease

## 2020-05-23 NOTE — Addendum Note (Signed)
Addended by: Yvonna Alanis E on: 05/23/2020 11:09 AM   Modules accepted: Orders

## 2020-06-28 ENCOUNTER — Encounter: Payer: Self-pay | Admitting: *Deleted

## 2020-09-03 ENCOUNTER — Other Ambulatory Visit: Payer: Self-pay

## 2020-09-03 NOTE — Telephone Encounter (Signed)
clopidogrel (PLAVIX) 75 MG tablet, REFILL REQUEST @ [facility] [phone] - [place], [address].

## 2020-09-04 MED ORDER — CLOPIDOGREL BISULFATE 75 MG PO TABS: ORAL_TABLET | ORAL | 1 refills | Status: DC

## 2020-09-04 NOTE — Telephone Encounter (Signed)
Pt checking on status of refill (plavix) as he had called yesterday- refill pending review  Pt wishes to pick up today Has appt with pcp on 09/01 Will send both pt and spouse's refill request to the appropriate team to assist with refill

## 2020-09-12 ENCOUNTER — Encounter: Payer: Self-pay | Admitting: Internal Medicine

## 2020-09-12 ENCOUNTER — Ambulatory Visit: Payer: BC Managed Care – PPO | Admitting: Internal Medicine

## 2020-09-12 ENCOUNTER — Other Ambulatory Visit: Payer: Self-pay

## 2020-09-12 VITALS — BP 120/81 | HR 77 | Temp 98.4°F | Ht 72.0 in | Wt 153.0 lb

## 2020-09-12 DIAGNOSIS — Z0000 Encounter for general adult medical examination without abnormal findings: Secondary | ICD-10-CM | POA: Diagnosis not present

## 2020-09-12 DIAGNOSIS — K219 Gastro-esophageal reflux disease without esophagitis: Secondary | ICD-10-CM | POA: Diagnosis not present

## 2020-09-12 DIAGNOSIS — I2510 Atherosclerotic heart disease of native coronary artery without angina pectoris: Secondary | ICD-10-CM

## 2020-09-12 DIAGNOSIS — E782 Mixed hyperlipidemia: Secondary | ICD-10-CM

## 2020-09-12 NOTE — Progress Notes (Signed)
Office Visit   Patient ID: [name], male    DOB: 04/07/1955, 65 y.o.   MRN: [id number]   PCP: Mitzi Hansen, MD   Subjective:  [name] is a 65 y.o. year old male who presents for follow up of chronic medical conditions including CAD and hyperlipidemia. Please refer to problem based charting for assessment and plan.   ACTIVE MEDICATIONS   Outpatient Medications Prior to Visit  Medication Sig   Cholecalciferol (VITAMIN D3) 400 units CAPS Take 2,000 Units by mouth daily.    clopidogrel (PLAVIX) 75 MG tablet TAKE 1 TABLET(75 MG) BY MOUTH DAILY   fluticasone (FLONASE SENSIMIST) 27.5 MCG/SPRAY nasal spray Place 2 sprays into the nose daily.   hydrOXYzine (ATARAX/VISTARIL) 10 MG tablet Take 1 tablet (10 mg total) by mouth 2 (two) times daily as needed for itching.   nitroGLYCERIN (NITROSTAT) 0.4 MG SL tablet PLACE 1 TABLET UNDER THE TONGUE 1 5 MINUTES AS NEEDED FOR CHEST PAIN CALL 911 AT 3RD DOSE WITHIN 15 MINUTES   pantoprazole (PROTONIX) 40 MG tablet Take 1 tablet (40 mg total) by mouth daily.   rosuvastatin (CRESTOR) 40 MG tablet TAKE 1 TABLET(40 MG) BY MOUTH DAILY   Saw Palmetto 450 MG CAPS Take 450 mg by mouth as needed (as a health supplement).    sodium chloride (OCEAN) 0.65 % SOLN nasal spray Place 1 spray into both nostrils daily. Prior to using flonase   triamcinolone ointment (KENALOG) 0.5 % Apply 1 application topically 2 (two) times daily as needed.   Turmeric Curcumin 500 MG CAPS Take 1 tablet by mouth daily.   [DISCONTINUED] acetaminophen (TYLENOL) 500 MG tablet Take 500 mg by mouth every 4 (four) hours as needed for mild pain.   [DISCONTINUED] clobetasol (TEMOVATE) 0.05 % external solution Apply 1 application topically 2 (two) times daily.   [DISCONTINUED] Clotrimazole 1 % OINT Apply 1 Dose topically 2 (two) times daily.   [DISCONTINUED] NON FORMULARY Take 1 tablet by mouth daily. Al Ajwa seeds   [DISCONTINUED] OVER THE COUNTER MEDICATION Neem Leaf--healthy skin  and digestion   No facility-administered medications prior to visit.     Objective:   BP 120/81 (BP Location: Left Arm, Patient Position: Sitting, Cuff Size: Normal)   Pulse 77   Temp 98.4 F (36.9 C) (Oral)   Ht 6' (1.829 m)   Wt 153 lb (69.4 kg)   SpO2 100% Comment: RA  BMI 20.75 kg/m  Wt Readings from Last 3 Encounters:  09/12/20 153 lb (69.4 kg)  04/04/20 150 lb 14.4 oz (68.4 kg)  03/25/20 148 lb 11.2 oz (67.4 kg)    BP Readings from Last 3 Encounters:  09/12/20 120/81  04/04/20 (!) 146/89  03/25/20 (!) 135/93   General: well appearing Cardiac: RRR, no LE edema Pulm: lungs clear throughout  Health Maintenance:   Health Maintenance  Topic Date Due   Zoster Vaccines- Shingrix (1 of 2) Never done   TETANUS/TDAP  08/22/2016   COVID-19 Vaccine (4 - Booster for Moderna series) 03/05/2020   PNA vac Low Risk Adult (1 of 2 - PCV13) 03/19/2020   INFLUENZA VACCINE  08/12/2020   COLON CANCER SCREENING ANNUAL FOBT  03/13/2021   Hepatitis C Screening  Completed   HIV Screening  Completed   HPV VACCINES  Aged Out   COLONOSCOPY (Pts 45-57yr Insurance coverage will need to be confirmed)  Discontinued    Assessment & Plan:   Problem List Items Addressed This Visit  Cardiovascular and Mediastinum   CAD (coronary artery disease) - Primary    Denies chest pain or shortness of breath with activity. Continue plavix and crestor Lipid panel today      Relevant Orders   Lipid panel     Other   Healthcare maintenance    Agreeable to shingrix. Will be obtained at the pharmacy. Will have him return for tetanus, pneumonia and influenza vaccines once its available      Return in about 1 year (around 09/12/2021).   Pt discussed with Dr. Forde Radon, MD Internal Medicine Resident PGY-3 [facility] Pager: [phone] 09/13/2020 5:56 AM

## 2020-09-13 MED ORDER — PANTOPRAZOLE SODIUM 40 MG PO TBEC: 40.0000 mg | DELAYED_RELEASE_TABLET | Freq: Every day | ORAL | 3 refills | Status: DC

## 2020-09-13 MED ORDER — ROSUVASTATIN CALCIUM 40 MG PO TABS: ORAL_TABLET | ORAL | 3 refills | Status: DC

## 2020-09-13 NOTE — Assessment & Plan Note (Addendum)
Agreeable to shingrix. Will be obtained at the pharmacy. Will have him return for tetanus, pneumonia and influenza vaccines once its available

## 2020-09-13 NOTE — Addendum Note (Signed)
Addended by: Mitzi Hansen on: 09/13/2020 06:13 AM   Modules accepted: Orders

## 2020-09-13 NOTE — Assessment & Plan Note (Addendum)
Denies chest pain or shortness of breath with activity.  Continue plavix and crestor  Lipid panel today

## 2020-09-14 LAB — LIPID PANEL
Chol/HDL Ratio: 2.7 ratio (ref 0.0–5.0)
Cholesterol, Total: 173 mg/dL (ref 100–199)
HDL: 64 mg/dL (ref >39)
LDL Chol Calc (NIH): 91 mg/dL (ref 0–99)
Triglycerides: 98 mg/dL (ref 0–149)
VLDL Cholesterol Cal: 18 mg/dL (ref 5–40)

## 2020-09-17 ENCOUNTER — Encounter: Payer: Self-pay | Admitting: Internal Medicine

## 2020-09-17 NOTE — Progress Notes (Signed)
Internal Medicine Clinic Attending  Case discussed with Dr. Christian  At the time of the visit.  We reviewed the resident's history and exam and pertinent patient test results.  I agree with the assessment, diagnosis, and plan of care documented in the resident's note.  

## 2020-09-20 ENCOUNTER — Telehealth: Payer: Self-pay

## 2020-09-20 NOTE — Telephone Encounter (Signed)
Requesting lab results, please call pt back.  

## 2020-09-20 NOTE — Telephone Encounter (Signed)
Results were mailed to him and his wife earlier this week. If they have follow up questions, I am happy to discuss with them.

## 2020-09-23 ENCOUNTER — Ambulatory Visit: Payer: BC Managed Care – PPO

## 2020-12-18 ENCOUNTER — Other Ambulatory Visit: Payer: Self-pay

## 2020-12-18 ENCOUNTER — Ambulatory Visit: Payer: BC Managed Care – PPO | Admitting: Cardiovascular Disease

## 2020-12-18 ENCOUNTER — Encounter: Payer: Self-pay | Admitting: Cardiovascular Disease

## 2020-12-18 VITALS — BP 136/82 | HR 81 | Ht 72.0 in | Wt 153.6 lb

## 2020-12-18 DIAGNOSIS — E7800 Pure hypercholesterolemia, unspecified: Secondary | ICD-10-CM | POA: Diagnosis not present

## 2020-12-18 DIAGNOSIS — I2510 Atherosclerotic heart disease of native coronary artery without angina pectoris: Secondary | ICD-10-CM

## 2020-12-18 MED ORDER — EZETIMIBE 10 MG PO TABS: 10.0000 mg | ORAL_TABLET | Freq: Every day | ORAL | 3 refills | Status: DC

## 2020-12-18 NOTE — Progress Notes (Signed)
Chief Complaint  Patient presents with   Follow-up    CAD   History of Present Illness: 65 yo male with history of CAD, OSA, hyperlipidemia here today for cardiac follow up. 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 In December 2009 he had a non-ST elevation MI treated with drug-eluting stents in the circumflex and LAD. He had a follow-up catheterization in February 2010 which showed nonobstructive disease. He was seen in our office May 2017 and he was having fatigue with low BP and chest burning while mowing the grass. Nuclear stress test 05/29/15 with no ischemia. Toprol was stopped and his fatigue resolved. He was seen in our office in July 2020 and had c/o chest pain. Nuclear stress test August 2020 with no ischemia.   He is here today for follow up. The patient denies any chest pain, dyspnea, palpitations, lower extremity edema, orthopnea, PND, dizziness, near syncope or syncope.    Primary Care Physician: Mitzi Hansen, MD  Past Medical History:  Diagnosis Date   Back pain    Coronary artery [place]    [place] [address] s/p DES to RCA. b. NSTEMI 12/2007 s/p DES to Cx and LAD.   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 Swallow whole.     clopidogrel (PLAVIX) 75 MG tablet TAKE 1 TABLET(75 MG) BY MOUTH DAILY 90 tablet 1   fluticasone (FLONASE SENSIMIST) 27.5 MCG/SPRAY nasal spray Place 2 sprays into the nose daily. 10 g 12   nitroGLYCERIN (NITROSTAT) 0.4 MG SL tablet PLACE 1 TABLET UNDER THE TONGUE 1 5 MINUTES AS NEEDED FOR CHEST PAIN CALL 911 AT 3RD DOSE WITHIN 15 MINUTES 25 tablet 5   NON FORMULARY Ajwadate - Supplement - Take 1  tablet by mouth daily     pantoprazole (PROTONIX) 40 MG tablet Take 1 tablet (40 mg total) by mouth daily. 90 tablet 3   rosuvastatin (CRESTOR) 40 MG tablet TAKE 1 TABLET(40 MG) BY MOUTH DAILY 90 tablet 3   Saw Palmetto 450 MG CAPS Take 450 mg by mouth as needed (as a health supplement).      ezetimibe (ZETIA) 10 MG tablet Take 1 tablet (10 mg total) by mouth daily. 90 tablet 3   sodium chloride (OCEAN) 0.65 % SOLN nasal spray Place 1 spray into both nostrils daily. Prior to using flonase (Patient not taking: Reported on 12/18/2020) 15 mL 0   Turmeric Curcumin 500 MG CAPS Take 1 tablet by mouth daily. (Patient not taking: Reported on 12/18/2020)     No current facility-administered medications for this visit.    Allergies  Allergen Reactions   Amoxicillin     REACTION: unspecified REACTION: unspecified   Citalopram Hydrobromide     Other reaction(s): DROWSINESS Other reaction(s): DROWSINESS   Clarithromycin     Other reaction(s): DIARRHEA Other reaction(s): DIARRHEA   Guaifenesin     Other reaction(s): HEADACHE Other reaction(s): HEADACHE   Penicillins     Other reaction(s): SWELLING Other reaction(s): SWELLING   Streptomycin     Other reaction(s): CHOKES Other reaction(s): CHOKES    Social History   Socioeconomic  History   Marital status: Married    Spouse name: Not on file   Number of children: Not on file   Years of education: Not on file   Highest education level: Not on file  Occupational History   Not on file  Tobacco Use   Smoking status: Never   Smokeless tobacco: Never  Vaping Use   Vaping Use: Never used  Substance and Sexual Activity   Alcohol use: No    Alcohol/week: 0.0 standard drinks   Drug use: No   Sexual activity: Not on file  Other Topics Concern   Not on file  Social History Narrative   Not on file   Social Determinants of Health   Financial Resource Strain: Not on file  Food Insecurity: Not on file  Transportation Needs: Not on file   Physical Activity: Not on file  Stress: Not on file  Social Connections: Not on file  Intimate Partner Violence: Not on file    Family History  Problem Relation Age of Onset   Coronary artery disease Mother    Hypertension Father    Heart attack Paternal Grandfather     Review of Systems:  As stated in the HPI and otherwise negative.   BP 136/82   Pulse 81   Ht 6' (1.829 m)   Wt 153 lb 9.6 oz (69.7 kg)   SpO2 99%   BMI 20.83 kg/m   Physical Examination:  General: Well developed, well nourished, NAD  HEENT: OP clear, mucus membranes moist  SKIN: warm, dry. No rashes. Neuro: No focal deficits  Musculoskeletal: Muscle strength 5/5 all ext  Psychiatric: Mood and affect normal  Neck: No JVD, no carotid bruits, no thyromegaly, no lymphadenopathy.  Lungs:Clear bilaterally, no wheezes, rhonci, crackles Cardiovascular: Regular rate and rhythm. No murmurs, gallops or rubs. Abdomen:Soft. Bowel sounds present. Non-tender.  Extremities: No lower extremity edema. Pulses are 2 + in the bilateral DP/PT.  EKG:  EKG is ordered today. The ekg ordered today demonstrates sinus  Recent Labs: 03/12/2020: ALT 29; BUN 13; Creatinine, Ser 0.99; Hemoglobin 14.7; Platelets 312; Potassium 4.7; Sodium 142   Lipid Panel    Component Value Date/Time   CHOL 173 09/12/2020 1440   TRIG 98 09/12/2020 1440   HDL 64 09/12/2020 1440   CHOLHDL 2.7 09/12/2020 1440   CHOLHDL 4 04/18/2014 1613   VLDL 18.8 04/18/2014 1613   LDLCALC 91 09/12/2020 1440   LDLDIRECT 146.2 11/20/2009 0907   LABVLDL 18 09/12/2020 1440     Wt Readings from Last 3 Encounters:  12/18/20 153 lb 9.6 oz (69.7 kg)  09/12/20 153 lb (69.4 kg)  04/04/20 150 lb 14.4 oz (68.4 kg)     Other studies Reviewed: Additional studies/ records that were reviewed today include:  Review of the above records demonstrates:   Assessment and Plan:   1. CAD without angina: He has no chest pain. Nuclear stress test August 2020 with no  ischemia. Continue ASA and statin. Will stop Plavix (he had been on Plavix monotherapy for years). No beta blocker due to fatigue while on Toprol  2. Hyperlipidemia: His lipids are followed in primary care. LDL 91 in September 2022. Continue statin.  Add Zetia 10 mg daily. Repeat lipids and LFTs in 12 weeks.   Current medicines are reviewed at length with the patient today.  The patient does not have concerns regarding medicines.  The following changes have been made:  no change  Labs/ tests ordered today include:   Orders  Placed This Encounter  Procedures   Lipid panel   Hepatic function panel   EKG 12-Lead     Disposition:   F/U with me in 12  months  Signed, Lauree Chandler, MD 12/19/2020 7:11 AM    [facility] [address], [place], [place]  [address] Phone: [phone]; Fax: [phone]

## 2020-12-18 NOTE — Patient Instructions (Addendum)
Medication Instructions:  Your physician has recommended you make the following change in your medication:  1.) start ezetimibe (Zetia) 10 mg - take one tablet daily  *If you need a refill on your cardiac medications before your next appointment, please call your pharmacy*   Lab Work: Please return in 3 months - lipids/liver   Testing/Procedures: none   Follow-Up: At [facility], you and your health needs are our priority.  As part of our continuing mission to provide you with exceptional heart care, we have created designated Provider Care Teams.  These Care Teams include your primary Cardiologist (physician) and Advanced Practice Providers (APPs -  Physician Assistants and Nurse Practitioners) who all work together to provide you with the care you need, when you need it.  We recommend signing up for the patient portal called "MyChart".  Sign up information is provided on this After Visit Summary.  MyChart is used to connect with patients for Virtual Visits (Telemedicine).  Patients are able to view lab/test results, encounter notes, upcoming appointments, etc.  Non-urgent messages can be sent to your provider as well.   To learn more about what you can do with MyChart, go to [email or web address].    Your next appointment:   12 month(s)  The format for your next appointment:   In Person  Provider:   Lauree Chandler, MD     Other Instructions

## 2020-12-19 ENCOUNTER — Telehealth: Payer: Self-pay | Admitting: Cardiovascular Disease

## 2020-12-19 MED ORDER — EZETIMIBE 10 MG PO TABS: 10.0000 mg | ORAL_TABLET | Freq: Every day | ORAL | 3 refills | Status: DC

## 2020-12-19 NOTE — Telephone Encounter (Signed)
Pt c/o medication issue:  1. Name of Medication: ezetimibe (ZETIA) 10 MG tablet  2. How are you currently taking this medication (dosage and times per day)? Has not started yet  3. Are you having a reaction (difficulty breathing--STAT)? no  4. What is your medication issue? Patient states the new prescription was not received by his pharmacy, because they had a power outage. He would like it sent again.

## 2020-12-19 NOTE — Telephone Encounter (Signed)
Zetia prescription has ben resent to [facility].

## 2020-12-25 ENCOUNTER — Telehealth: Payer: Self-pay | Admitting: Cardiovascular Disease

## 2020-12-25 NOTE — Telephone Encounter (Signed)
Pt c/o medication issue:  1. Name of Medication: Zetia  2. How are you currently taking this medication (dosage and times per day)?  1 time a day  3. Are you having a reaction (difficulty breathing--STAT)?   4. What is your medication issue?  My body was sluggish, could not sleep well,  aching in his muscles and stomach is upset- he wants to go back to the old medicine

## 2020-12-25 NOTE — Telephone Encounter (Signed)
Left detailed message (DPR) to stop zetia and continue on Crestor 40 mg.  Adv that I would let Dr. Angelena Form know that he is not tolerating Zetia and if there are other recommendations we will call him back.

## 2021-01-24 ENCOUNTER — Telehealth: Payer: Self-pay | Admitting: *Deleted

## 2021-01-24 NOTE — Telephone Encounter (Signed)
Patient is calling to schedule an appointment, have right foot /heel pain,can hardly put weight  on it.Please schedule-NP

## 2021-02-03 ENCOUNTER — Ambulatory Visit: Payer: BC Managed Care – PPO | Admitting: Cardiovascular Disease

## 2021-02-26 ENCOUNTER — Ambulatory Visit: Payer: BC Managed Care – PPO | Admitting: Student

## 2021-02-26 ENCOUNTER — Encounter: Payer: Self-pay | Admitting: Student

## 2021-02-26 DIAGNOSIS — B360 Pityriasis versicolor: Secondary | ICD-10-CM | POA: Diagnosis not present

## 2021-02-26 HISTORY — DX: Pityriasis versicolor: B36.0

## 2021-02-26 MED ORDER — KETOCONAZOLE 2 % EX SHAM: 1.0000 "application " | MEDICATED_SHAMPOO | Freq: Every day | CUTANEOUS | 0 refills | Status: DC

## 2021-02-26 NOTE — Assessment & Plan Note (Signed)
Patient presenting with complaints of skin lesions on back.  States that he had a history of shingles in the 1990s for which he received treatment via an injection but is unsure of medication used.  States that about 1 week ago he began having intermittent pain in his right mid back.  States that pain was dull and about 5 out of 10 in severity.  States that the pain would go away and recur intermittently over 4 to 5 days.  About 5 days later, he took a picture of that specific area on his back and noted a patch of skin that appeared different and appeared like a rash.  He became concerned that this could be a recurrence of shingles and thus decided to come in for further evaluation.  On my examination, patient found to have numerous small, hyperpigmented patches on back and posterior arms that do not follow-up singular dermatomal distribution.  The patches are not vesicular in nature and are not painful to palpation, which makes this less likely to be shingles.  Patient was unaware of the rest of the hyperpigmented patches until they were shown to him with a mirror.  Images of these patches are in the media tab.  Discussed with patient that he is likely suffering from his skin discolorations likely secondary to a fungal infection.  He was relieved to hear this.  Prescribed patient ketoconazole shampoo 2% and advised him to apply to affected areas daily and leave on for about 5 minutes before washing away.  Should patches not resolve in the next week or 2, advised patient to return to clinic for reevaluation.  He is agreeable with this.  Plan: -Ketoconazole shampoo 2%

## 2021-02-26 NOTE — Progress Notes (Signed)
° °  CC: Evaluation of skin lesions on back  HPI:  [name] is a 66 y.o. male with history listed below presenting to the [facility] for evaluation of skin lesions on back. Please see individualized problem based charting for full HPI.  Past Medical History:  Diagnosis Date   Back pain    Coronary artery disease    [place] [address] s/p DES to RCA. b. NSTEMI 12/2007 s/p DES to Cx and LAD.   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  Physical Exam:  Vitals:   02/26/21 0917  BP: 130/89  Pulse: 80  Temp: 98.2 F (36.8 C)  TempSrc: Oral  SpO2: 100%  Height: 6' (1.829 m)   Physical Exam Constitutional:      Appearance: Normal appearance. He is not ill-appearing.  HENT:     Mouth/Throat:     Mouth: Mucous membranes are moist.     Pharynx: Oropharynx is clear. No oropharyngeal exudate.  Eyes:     Extraocular Movements: Extraocular movements intact.     Conjunctiva/sclera: Conjunctivae normal.     Pupils: Pupils are equal, round, and reactive to light.  Cardiovascular:     Rate and Rhythm: Normal rate and regular rhythm.     Pulses: Normal pulses.     Heart sounds: Normal heart sounds. No murmur heard.   No gallop.  Pulmonary:     Effort: Pulmonary effort is normal.     Breath sounds: Normal breath sounds. No wheezing, rhonchi or rales.  Abdominal:     General: Bowel sounds are normal. There is no distension.     Palpations: Abdomen is soft.     Tenderness: There is no abdominal tenderness.  Musculoskeletal:        General: No swelling. Normal range of motion.  Skin:    General: Skin is warm and dry.     Comments: Patient with numerous small, discolored/hyperpigmented patches on back and posterior arms as seen in images listed below.  No tenderness to palpation and no  distinct dermatomal distribution noted.  Positive fluff test.  Neurological:     General: No focal deficit present.     Mental Status: He is alert and oriented to person, place, and time.  Psychiatric:        Mood and Affect: Mood normal.        Behavior: Behavior normal.                Assessment & Plan:   See Encounters Tab for problem based charting.  Patient seen with Dr.  Saverio Danker

## 2021-02-26 NOTE — Patient Instructions (Signed)
[name],  It was a pleasure seeing you in the clinic today.   It appears that your skin discoloration is from a fungal infection. I have prescribed a topical antifungal shampoo for you to help with this. Please apply to affected areas of skin and leave on for 5 minutes before washing shampoo off. Apply it once a day to your skin. You can stop applying shampoo once the discoloration has gone away. If your skin discoloration is not improving or has not gone away in the next week or two, please contact our clinic again for reevaluation.  Please call our clinic at [phone] if you have any questions or concerns. The best time to call is Monday-Friday from 9am-4pm, but there is someone available 24/7 at the same number. If you need medication refills, please notify your pharmacy one week in advance and they will send [place] a request.   Thank you for letting us take part in your care. We look forward to seeing you next time!

## 2021-03-03 NOTE — Progress Notes (Addendum)
Internal Medicine Clinic Attending  I saw and evaluated the patient.  I personally confirmed the key portions of the history and exam documented by Dr. Allyson Sabal and I reviewed pertinent patient test results.  The assessment, diagnosis, and plan were formulated together and I agree with the documentation in the residents note. Hyperpigmented, flat macules and patches present diffusely over the back, with evidence of fine scale present with light touch and skin tension. Patient has a history of seborrheic dermatitis of the face. Discussed that this may be caused by the same fungus and will treat with antifungal therapy. If not improving, we discussed return for further evaluation.

## 2021-03-18 ENCOUNTER — Other Ambulatory Visit: Payer: Self-pay

## 2021-03-18 ENCOUNTER — Other Ambulatory Visit: Payer: BC Managed Care – PPO | Admitting: *Deleted

## 2021-03-18 DIAGNOSIS — I2510 Atherosclerotic heart disease of native coronary artery without angina pectoris: Secondary | ICD-10-CM

## 2021-03-18 DIAGNOSIS — E7800 Pure hypercholesterolemia, unspecified: Secondary | ICD-10-CM

## 2021-03-18 LAB — LIPID PANEL
Chol/HDL Ratio: 3.1 ratio (ref 0.0–5.0)
Cholesterol, Total: 176 mg/dL (ref 100–199)
HDL: 57 mg/dL (ref >39)
LDL Chol Calc (NIH): 103 mg/dL — ABNORMAL HIGH (ref 0–99)
Triglycerides: 84 mg/dL (ref 0–149)
VLDL Cholesterol Cal: 16 mg/dL (ref 5–40)

## 2021-03-18 LAB — HEPATIC FUNCTION PANEL
ALT: 17 IU/L (ref 0–44)
AST: 20 IU/L (ref 0–40)
Albumin: 5.1 g/dL — ABNORMAL HIGH (ref 3.8–4.8)
Alkaline Phosphatase: 60 IU/L (ref 44–121)
Bilirubin Total: 0.8 mg/dL (ref 0.0–1.2)
Bilirubin, Direct: 0.2 mg/dL (ref 0.00–0.40)
Total Protein: 7.2 g/dL (ref 6.0–8.5)

## 2021-04-16 ENCOUNTER — Encounter: Payer: Self-pay | Admitting: Internal Medicine

## 2021-04-16 ENCOUNTER — Other Ambulatory Visit: Payer: Self-pay

## 2021-04-16 ENCOUNTER — Ambulatory Visit: Payer: BC Managed Care – PPO | Admitting: Internal Medicine

## 2021-04-16 VITALS — BP 133/92 | HR 86 | Temp 98.3°F | Ht 72.0 in | Wt 154.8 lb

## 2021-04-16 DIAGNOSIS — B360 Pityriasis versicolor: Secondary | ICD-10-CM | POA: Diagnosis not present

## 2021-04-16 DIAGNOSIS — I2510 Atherosclerotic heart disease of native coronary artery without angina pectoris: Secondary | ICD-10-CM

## 2021-04-16 DIAGNOSIS — Z1211 Encounter for screening for malignant neoplasm of colon: Secondary | ICD-10-CM

## 2021-04-16 NOTE — Progress Notes (Signed)
? ?Office Visit ? ? Patient ID: [name], male    DOB: [birth year], 66 y.o.   MRN: [id number]   PCP: Mitzi Hansen, MD  ? ?Subjective:  ? ?[name] is a 66 y.o. year old male with CAD and history of NSTEMI (2009) s/p PCI (RCA, circumflex and LAD).  ? ?LOV with me was 09/2020. He had no major complaints at that time. ? ?Interm history ?12/2020: seen by cardiology. Started on zetia due to persistently elevated LDL. He developed myalgias and nausea and it was discontinued.  ?02/2021: seen in the [facility] with Dr. Allyson Sabal for hyperpigmented, flat macules and patches over his back. Prescribed ketoconazole shampoo. ? ?He presents today for follow up of the skin lesions. ? ? ? ?ACTIVE MEDICATIONS  ? ?Outpatient Medications Prior to Visit  ?Medication Sig  ? aspirin EC 81 MG tablet Take 81 mg by mouth daily. Swallow whole.  ? fluticasone (FLONASE SENSIMIST) 27.5 MCG/SPRAY nasal spray Place 2 sprays into the nose daily.  ? ketoconazole (NIZORAL) 2 % shampoo Apply 1 application topically daily. Apply to affected areas and leave on skin for 5 minutes prior to washing off.  ? nitroGLYCERIN (NITROSTAT) 0.4 MG SL tablet PLACE 1 TABLET UNDER THE TONGUE 1 5 MINUTES AS NEEDED FOR CHEST PAIN CALL 911 AT 3RD DOSE WITHIN 15 MINUTES  ? NON FORMULARY Ajwadate - Supplement - Take 1 tablet by mouth daily  ? pantoprazole (PROTONIX) 40 MG tablet Take 1 tablet (40 mg total) by mouth daily.  ? rosuvastatin (CRESTOR) 40 MG tablet TAKE 1 TABLET(40 MG) BY MOUTH DAILY  ? Saw Palmetto 450 MG CAPS Take 450 mg by mouth as needed (as a health supplement).   ? sodium chloride (OCEAN) 0.65 % SOLN nasal spray Place 1 spray into both nostrils daily. Prior to using flonase (Patient not taking: Reported on 12/18/2020)  ? ?No facility-administered medications prior to visit.  ?  ? ?Objective:  ? ?BP (!) 133/92 (BP Location: Right Arm, Patient Position: Sitting, Cuff Size: Normal)   Pulse 86   Temp 98.3 ?F (36.8 ?C) (Oral)   Ht 6' (1.829 m)   Wt 154 lb  12.8 oz (70.2 kg)   SpO2 100%   BMI 20.99 kg/m?  ?Wt Readings from Last 3 Encounters:  ?04/16/21 154 lb 12.8 oz (70.2 kg)  ?12/18/20 153 lb 9.6 oz (69.7 kg)  ?09/12/20 153 lb (69.4 kg)  ?  ?BP Readings from Last 3 Encounters:  ?04/16/21 (!) 133/92  ?02/26/21 130/89  ?12/18/20 136/82  ? ?General: well appearing male ?Cardiac: RRR ?Pulm: lungs clear throughout ?Skin: resolving macular rash on the back ? ?Health Maintenance:  ? ?Health Maintenance  ?Topic Date Due  ? Zoster Vaccines- Shingrix (1 of 2) Never done  ? Pneumonia Vaccine 60+ Years old (2 - PCV) 11/26/2010  ? TETANUS/TDAP  08/22/2016  ? COVID-19 Vaccine (3 - Booster for Moderna series) 12/29/2019  ? COLON CANCER SCREENING ANNUAL FOBT  03/13/2021  ? INFLUENZA VACCINE  08/12/2021  ? Hepatitis C Screening  Completed  ? HPV VACCINES  Aged Out  ? COLONOSCOPY (Pts 45-21yr Insurance coverage will need to be confirmed)  Discontinued  ? ? ?Assessment & Plan:  ? ?Problem List Items Addressed This Visit   ? ?  ? Cardiovascular and Mediastinum  ? CAD (coronary artery disease)  ?  He is currently taking crestor '40mg'$  daily. He did not tolerate the addition of zetia. Discussed the role of PSK-9's with him however he wants to just  work on managing it through dietary changes. He is aware to call cardiology if he changes his mind and wishes to start a PSK-9i.  ?-continue crestor '40mg'$  daily and aspirin '81mg'$  daily ?  ?  ?  ? Musculoskeletal and Integument  ? 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  Significantly improved in comparison to photos taken at his prior office visit in February. He notes he has a little bit of the shampoo left so I instructed him to go ahead a finish that, after which time he can continue to monitor for recurrence.  ?  ?  ? ?Other Visit Diagnoses   ? ? Colon cancer screening    -  Primary  ? Relevant Orders  ? Fecal occult blood, imunochemical  ? ?  ? ? ? ?Pt discussed with Dr. Cain Sieve ? ?Mitzi Hansen, MD ?Internal Medicine Resident  PGY-3 ?[facility] ?04/17/2021 2:12 PM  ?  ?

## 2021-04-17 ENCOUNTER — Encounter: Payer: Self-pay | Admitting: Internal Medicine

## 2021-04-17 NOTE — Assessment & Plan Note (Signed)
Significantly improved in comparison to photos taken at his prior office visit in February. He notes he has a little bit of the shampoo left so I instructed him to go ahead a finish that, after which time he can continue to monitor for recurrence.  ?

## 2021-04-17 NOTE — Assessment & Plan Note (Signed)
He is currently taking crestor '40mg'$  daily. He did not tolerate the addition of zetia. Discussed the role of PSK-9's with him however he wants to just work on managing it through dietary changes. He is aware to call cardiology if he changes his mind and wishes to start a PSK-9i.  ?-continue crestor '40mg'$  daily and aspirin '81mg'$  daily ?

## 2021-04-17 NOTE — Assessment & Plan Note (Signed)
FIT test provided today.  ?

## 2021-04-18 NOTE — Progress Notes (Signed)
Internal Medicine Clinic Attending  Case discussed with Dr. Christian  At the time of the visit.  We reviewed the resident's history and exam and pertinent patient test results.  I agree with the assessment, diagnosis, and plan of care documented in the resident's note.  

## 2021-04-25 ENCOUNTER — Other Ambulatory Visit: Payer: BC Managed Care – PPO

## 2021-04-25 DIAGNOSIS — Z1211 Encounter for screening for malignant neoplasm of colon: Secondary | ICD-10-CM

## 2021-04-28 LAB — FECAL OCCULT BLOOD, IMMUNOCHEMICAL: Fecal Occult Bld: NEGATIVE

## 2021-04-30 NOTE — Progress Notes (Signed)
Results relayed to [place]. Repeat in 1 year.

## 2021-06-19 ENCOUNTER — Encounter: Payer: Self-pay | Admitting: *Deleted

## 2021-08-04 ENCOUNTER — Encounter: Payer: Self-pay | Admitting: Student

## 2021-08-04 ENCOUNTER — Telehealth: Payer: Self-pay | Admitting: *Deleted

## 2021-08-04 ENCOUNTER — Ambulatory Visit: Payer: BC Managed Care – PPO | Admitting: Student

## 2021-08-04 ENCOUNTER — Emergency Department (HOSPITAL_COMMUNITY): Payer: BC Managed Care – PPO

## 2021-08-04 ENCOUNTER — Ambulatory Visit (HOSPITAL_COMMUNITY)
Admission: RE | Admit: 2021-08-04 | Discharge: 2021-08-04 | Disposition: A | Discharge status: Home / Self Care | Payer: BC Managed Care – PPO | Source: Ambulatory Visit | Attending: Internal Medicine | Admitting: Internal Medicine

## 2021-08-04 ENCOUNTER — Other Ambulatory Visit: Payer: Self-pay

## 2021-08-04 ENCOUNTER — Emergency Department (HOSPITAL_COMMUNITY)
Admission: EM | Admit: 2021-08-04 | Discharge: 2021-08-05 | Discharge status: Against Medical Advice | Payer: BC Managed Care – PPO | Attending: Emergency Medicine | Admitting: Emergency Medicine

## 2021-08-04 ENCOUNTER — Telehealth: Payer: Self-pay | Admitting: Cardiovascular Disease

## 2021-08-04 ENCOUNTER — Encounter (HOSPITAL_COMMUNITY): Payer: Self-pay | Admitting: Emergency Medicine

## 2021-08-04 VITALS — BP 129/84 | HR 94 | Temp 98.3°F | Ht 72.0 in | Wt 155.7 lb

## 2021-08-04 DIAGNOSIS — Z7982 Long term (current) use of aspirin: Secondary | ICD-10-CM | POA: Diagnosis not present

## 2021-08-04 DIAGNOSIS — I214 Non-ST elevation (NSTEMI) myocardial infarction: Secondary | ICD-10-CM | POA: Diagnosis not present

## 2021-08-04 DIAGNOSIS — R079 Chest pain, unspecified: Secondary | ICD-10-CM | POA: Insufficient documentation

## 2021-08-04 DIAGNOSIS — I2510 Atherosclerotic heart disease of native coronary artery without angina pectoris: Secondary | ICD-10-CM | POA: Diagnosis present

## 2021-08-04 DIAGNOSIS — Z79899 Other long term (current) drug therapy: Secondary | ICD-10-CM | POA: Diagnosis not present

## 2021-08-04 DIAGNOSIS — R0602 Shortness of breath: Secondary | ICD-10-CM | POA: Insufficient documentation

## 2021-08-04 DIAGNOSIS — I252 Old myocardial infarction: Secondary | ICD-10-CM | POA: Insufficient documentation

## 2021-08-04 LAB — CBC WITH DIFFERENTIAL/PLATELET
Abs Immature Granulocytes: 0.02 10*3/uL (ref 0.00–0.07)
Abs Immature Granulocytes: 0.01 10*3/uL (ref 0.00–0.07)
Basophils Absolute: 0.1 10*3/uL (ref 0.0–0.1)
Basophils Absolute: 0 10*3/uL (ref 0.0–0.1)
Basophils Relative: 1 %
Basophils Relative: 1 %
Eosinophils Absolute: 0.1 10*3/uL (ref 0.0–0.5)
Eosinophils Absolute: 0.1 10*3/uL (ref 0.0–0.5)
Eosinophils Relative: 1 %
Eosinophils Relative: 1 %
HCT: 42.7 % (ref 39.0–52.0)
HCT: 41.6 % (ref 39.0–52.0)
Hemoglobin: 14.9 g/dL (ref 13.0–17.0)
Hemoglobin: 14.4 g/dL (ref 13.0–17.0)
Immature Granulocytes: 0 %
Immature Granulocytes: 0 %
Lymphocytes Relative: 19 %
Lymphocytes Relative: 19 %
Lymphs Abs: 1.5 10*3/uL (ref 0.7–4.0)
Lymphs Abs: 1.4 10*3/uL (ref 0.7–4.0)
MCH: 30.3 pg (ref 26.0–34.0)
MCH: 30 pg (ref 26.0–34.0)
MCHC: 34.9 g/dL (ref 30.0–36.0)
MCHC: 34.6 g/dL (ref 30.0–36.0)
MCV: 86.8 fL (ref 80.0–100.0)
MCV: 86.7 fL (ref 80.0–100.0)
Monocytes Absolute: 1 10*3/uL (ref 0.1–1.0)
Monocytes Absolute: 0.8 10*3/uL (ref 0.1–1.0)
Monocytes Relative: 12 %
Monocytes Relative: 11 %
Neutro Abs: 5.5 10*3/uL (ref 1.7–7.7)
Neutro Abs: 4.9 10*3/uL (ref 1.7–7.7)
Neutrophils Relative %: 67 %
Neutrophils Relative %: 68 %
Platelets: 231 10*3/uL (ref 150–400)
Platelets: 227 10*3/uL (ref 150–400)
RBC: 4.92 MIL/uL (ref 4.22–5.81)
RBC: 4.8 MIL/uL (ref 4.22–5.81)
RDW: 12.9 % (ref 11.5–15.5)
RDW: 12.8 % (ref 11.5–15.5)
WBC: 8.1 10*3/uL (ref 4.0–10.5)
WBC: 7.2 10*3/uL (ref 4.0–10.5)
nRBC: 0 % (ref 0.0–0.2)
nRBC: 0 % (ref 0.0–0.2)

## 2021-08-04 LAB — BASIC METABOLIC PANEL
Anion gap: 8 (ref 5–15)
BUN: 9 mg/dL (ref 8–23)
CO2: 26 mmol/L (ref 22–32)
Calcium: 9.5 mg/dL (ref 8.9–10.3)
Chloride: 104 mmol/L (ref 98–111)
Creatinine, Ser: 1.08 mg/dL (ref 0.61–1.24)
GFR, Estimated: >60 mL/min (ref >60)
Glucose, Bld: 130 mg/dL — ABNORMAL HIGH (ref 70–99)
Potassium: 3.5 mmol/L (ref 3.5–5.1)
Sodium: 138 mmol/L (ref 135–145)

## 2021-08-04 LAB — COMPREHENSIVE METABOLIC PANEL
ALT: 22 U/L (ref 0–44)
AST: 38 U/L (ref 15–41)
Albumin: 4.9 g/dL (ref 3.5–5.0)
Alkaline Phosphatase: 56 U/L (ref 38–126)
Anion gap: 10 (ref 5–15)
BUN: 9 mg/dL (ref 8–23)
CO2: 27 mmol/L (ref 22–32)
Calcium: 9.7 mg/dL (ref 8.9–10.3)
Chloride: 103 mmol/L (ref 98–111)
Creatinine, Ser: 1.11 mg/dL (ref 0.61–1.24)
GFR, Estimated: >60 mL/min (ref >60)
Glucose, Bld: 108 mg/dL — ABNORMAL HIGH (ref 70–99)
Potassium: 3.5 mmol/L (ref 3.5–5.1)
Sodium: 140 mmol/L (ref 135–145)
Total Bilirubin: 0.8 mg/dL (ref 0.3–1.2)
Total Protein: 7.5 g/dL (ref 6.5–8.1)

## 2021-08-04 LAB — TROPONIN I (HIGH SENSITIVITY)
Troponin I (High Sensitivity): 3657 ng/L (ref <18)
Troponin I (High Sensitivity): 2668 ng/L (ref <18)

## 2021-08-04 MED ORDER — METOPROLOL TARTRATE 25 MG PO TABS
25.0000 mg | ORAL_TABLET | Freq: Two times a day (BID) | ORAL | Status: DC
  Administered 2021-08-04: 25 mg via ORAL
  Filled 2021-08-04: qty 1

## 2021-08-04 MED ORDER — HEPARIN (PORCINE) 25000 UT/250ML-% IV SOLN
850.0000 [IU]/h | INTRAVENOUS | Status: DC
  Administered 2021-08-04: 850 [IU]/h via INTRAVENOUS
  Filled 2021-08-04: qty 250

## 2021-08-04 MED ORDER — NITROGLYCERIN 0.4 MG SL SUBL: 0.4000 mg | SUBLINGUAL_TABLET | SUBLINGUAL | Status: DC | PRN

## 2021-08-04 MED ORDER — ONDANSETRON HCL 4 MG/2ML IJ SOLN: 4.0000 mg | Freq: Four times a day (QID) | INTRAMUSCULAR | Status: DC | PRN

## 2021-08-04 MED ORDER — ACETAMINOPHEN 325 MG PO TABS: 650.0000 mg | ORAL_TABLET | ORAL | Status: DC | PRN

## 2021-08-04 MED ORDER — ASPIRIN 81 MG PO TBEC
81.0000 mg | DELAYED_RELEASE_TABLET | Freq: Every day | ORAL | Status: DC
Start: 2021-08-05 — End: 2021-08-05

## 2021-08-04 MED ORDER — ASPIRIN 81 MG PO CHEW
324.0000 mg | CHEWABLE_TABLET | Freq: Once | ORAL | Status: AC
  Administered 2021-08-04: 324 mg via ORAL
  Filled 2021-08-04: qty 4

## 2021-08-04 MED ORDER — HEPARIN BOLUS VIA INFUSION
4000.0000 [IU] | Freq: Once | INTRAVENOUS | Status: AC
  Administered 2021-08-04: 4000 [IU] via INTRAVENOUS
  Filled 2021-08-04: qty 4000

## 2021-08-04 NOTE — Telephone Encounter (Signed)
Agree thank you 

## 2021-08-04 NOTE — Progress Notes (Signed)
Internal Medicine Clinic Attending  Case discussed with the resident at the time of the visit.  We reviewed the resident's history and exam and pertinent patient test results.  I agree with the assessment, diagnosis, and plan of care documented in the resident's note.I personally reviewed EKG with Dr Venita Sheffield, notable for new LAD from his previous EKG with cardiology last December.  He was adamant about not going to ED initially so we obtained stat labs including a troponin that returned greater than 3000.  Dr Venita Sheffield and I called the patient back and stressed the importance of expedited workup and clinical evaluation that can only happen in the ED.

## 2021-08-04 NOTE — ED Notes (Signed)
Patient ambulating to restroom

## 2021-08-04 NOTE — H&P (Signed)
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 He presented to the ED today for evaluation of chest pain and was found to have an elevated troponin.   History of Present Illness    Reports persistent and profuse diaphoresis and chest pain after working outside in the yard yesterday, started about 24 hours ago. He describes it as more left-sided, eventually with radiation into his upper back after taking nitroglycerin. He took a full dose aspirin and a total of 7-8 SL NTG with incremental relief of the pain. He did not seek medical attention initially, but was seen by his PCP earlier today. His chest pain had resolved and he refused to go to the ED at the time. His in clinic was normal, though troponin returned at over 3000, so he was called and ultimately agreed to further evaluation in the ED. On arrival, ECG was unchanged and troponin was down-trending.   Regarding his prior CV history, he had an inferior MI in July 2009 s/p DES to RCA, then an NSTEMI in Dec the same year with PCI of Lcx and LAD. Llast catheterization in February 2010 showed nonobstructive disease following his PCIs the prior year. He subsequently underwent nuclear stress test in 2017 due to fatigue with low BP and chest burning while mowing the grass, but showed no ischemia. Toprol was stopped and his fatigue resolved. He was seen again in July 2020 with chest pain, and nuclear stress test August 2020 was again negative for ischemia. He was last seen by Dr. Angelena Form 12/2020 and doing well at that time, so Plavix was discontinued and he was continued on aspirin.  He was trailed on Zetia due to LDL of 91 but didn't tolerate this.    Past Medical History   Past Medical History:  Diagnosis Date   Back pain    Coronary artery disease    [place] [address] s/p DES to RCA. b. NSTEMI 12/2007 s/p DES to Cx and LAD.   GERD (gastroesophageal reflux disease)    Hyperlipidemia    MYOCARDIAL INFARCTION 06/29/2008   Qualifier: History of  By: Quentin Cornwall CMA, Janett Billow      Past Surgical History:  Procedure Laterality Date   TONSILLECTOMY       Allergies Allergies  Allergen Reactions   Amoxicillin     REACTION: unspecified REACTION: unspecified   Citalopram Hydrobromide     Other reaction(s): DROWSINESS Other reaction(s): DROWSINESS   Clarithromycin     Other reaction(s): DIARRHEA Other reaction(s): DIARRHEA   Guaifenesin     Other reaction(s): HEADACHE Other reaction(s): HEADACHE   Penicillins     Other reaction(s): SWELLING Other reaction(s): SWELLING   Streptomycin     Other reaction(s): CHOKES Other reaction(s): CHOKES   Zetia [Ezetimibe] Other (See Comments)    Nausea, muscle aches, sluggish    Home Medications    Prior to Admission medications   Medication Sig Start Date End Date Taking? Authorizing Provider  aspirin EC 81 MG tablet Take 81 mg by mouth daily. Swallow whole.    [provider]  fluticasone (FLONASE SENSIMIST) 27.5 MCG/SPRAY  nasal spray Place 2 sprays into the nose daily. 03/12/20   Seawell, Jaimie A, DO  ketoconazole (NIZORAL) 2 % shampoo Apply 1 application topically daily. Apply to affected areas and leave on skin for 5 minutes prior to washing off. 02/26/21   Virl Axe, MD  nitroGLYCERIN (NITROSTAT) 0.4 MG SL tablet PLACE 1 TABLET UNDER THE TONGUE 1 5 MINUTES AS NEEDED FOR CHEST PAIN CALL 911 AT 3RD DOSE WITHIN 15 MINUTES 12/11/19   Burnell Blanks, MD  NON FORMULARY Ajwadate - Supplement - Take 1 tablet by mouth daily    [provider]  pantoprazole (PROTONIX) 40 MG tablet Take 1 tablet (40  mg total) by mouth daily. 09/13/20   Mitzi Hansen, MD  rosuvastatin (CRESTOR) 40 MG tablet TAKE 1 TABLET(40 MG) BY MOUTH DAILY 09/13/20   Christian, Rylee, MD  Saw Palmetto 450 MG CAPS Take 450 mg by mouth as needed (as a health supplement).  03/30/13   [provider]  sodium chloride (OCEAN) 0.65 % SOLN nasal spray Place 1 spray into both nostrils daily. Prior to using flonase Patient not taking: Reported on 12/18/2020 04/04/20   Marty Heck, DO    Family History    Mother - CAD with 4 stents at age 2; diabetes Paternal Grandfather - CAD with massive MI in his 59's Paternal Uncle - CAD, died from massive MI in his late 60s Sister - diabetes Father - hypertension  Social History    Social History   Socioeconomic History   Marital status: Married    Spouse name: Not on file   Number of children: Not on file   Years of education: Not on file   Highest education level: Not on file  Occupational History   Not on file  Tobacco Use   Smoking status: Never   Smokeless tobacco: Never  Vaping Use   Vaping Use: Never used  Substance and Sexual Activity   Alcohol use: No    Alcohol/week: 0.0 standard drinks of alcohol   Drug use: No   Sexual activity: Not on file  Other Topics Concern   Not on file  Social History Narrative   Not on file   Social Determinants of Health   Financial Resource Strain: Not on file  Food Insecurity: Not on file  Transportation Needs: Not on file  Physical Activity: Not on file  Stress: Not on file  Social Connections: Not on file  Intimate Partner Violence: Not on file     Review of Systems    General:  No chills, fever, night sweats or weight changes.  Cardiovascular:  No chest pain, dyspnea on exertion, edema, orthopnea, palpitations, paroxysmal nocturnal dyspnea. Dermatological: No rash, lesions/masses Respiratory: No cough, dyspnea Urologic: No hematuria, dysuria Abdominal:   No nausea, vomiting, diarrhea, bright red blood  per rectum, melena, or hematemesis Neurologic:  No visual changes, wkns, changes in mental status. All other systems reviewed and are otherwise negative except as noted above.  Physical Exam    BP (!) 139/100   Pulse 86   Resp (!) 22   SpO2 98%  General: Alert, NAD HEENT: Normal  Neck: No bruits or JVD. Lungs:  Resp regular and unlabored, CTA bilaterally. Heart: Regular rhythm, no s3, s4, or murmurs. Abdomen: Soft, non-tender, non-distended, BS +.  Extremities: Warm. No clubbing, cyanosis or edema. DP/PT/Radials 2+ and equal bilaterally. Psych: Normal affect. Neuro: Alert and oriented. No gross focal deficits. No abnormal movements.  Labs    Cardiac Panel (  last 3 results) Recent Labs    08/04/21 1506 08/04/21 1855  TROPONINIHS 3,657* 2,668*    Lab Results  Component Value Date   WBC 7.2 08/04/2021   HGB 14.4 08/04/2021   HCT 41.6 08/04/2021   MCV 86.7 08/04/2021   PLT 227 08/04/2021    Recent Labs  Lab 08/04/21 1506 08/04/21 1855  NA 140 138  K 3.5 3.5  CL 103 104  CO2 27 26  BUN 9 9  CREATININE 1.11 1.08  CALCIUM 9.7 9.5  PROT 7.5  --   BILITOT 0.8  --   ALKPHOS 56  --   ALT 22  --   AST 38  --   GLUCOSE 108* 130*   Lab Results  Component Value Date   CHOL 176 03/18/2021   HDL 57 03/18/2021   LDLCALC 103 (H) 03/18/2021   TRIG 84 03/18/2021   Lab Results  Component Value Date   DDIMER 0.58 (H) 03/12/2020     Radiology Studies    DG Chest Portable 1 View  Result Date: 08/04/2021 CLINICAL DATA:  Left chest pain EXAM: PORTABLE CHEST 1 VIEW COMPARISON:  03/13/2020 FINDINGS: Lungs are clear.  No pleural effusion or pneumothorax. The heart is normal in size. IMPRESSION: No evidence of acute cardiopulmonary disease. Electronically Signed   By: Julian Hy M.D.   On: 08/04/2021 20:09    ECG & Cardiac Imaging (personally reviewed)    ECG (clinic): NSR, normal ECG ECG (ED): NSR, baseline artifact but probably normal. Axis has shifted right but  still WNL.    Nuclear stress 2020: Nuclear stress EF: 55%. The left ventricular ejection fraction is normal (55-65%). There was no ST segment deviation noted during stress. This is a low risk study. Defect 1: There is a small defect of mild severity present in the basal inferoseptal location. No significant change from prior study.  Echocardiogram 2013: Summary: - Left ventricle: The cavity size was normal. Wall thickness    was normal. Systolic function was normal. The estimated    ejection fraction was in the range of 55% to 65%. Wall    motion was normal; there were no regional wall motion    abnormalities. Left ventricular diastolic function    parameters were normal.  - Atrial septum: No defect or patent foramen ovale was    identified.  ------------------------------------------------------------  Left ventricle:  The cavity size was normal. Wall thickness  was normal. Systolic function was normal. The estimated  ejection fraction was in the range of 55% to 65%. Wall  motion was normal; there were no regional wall motion  abnormalities. The transmitral flow pattern was normal. The  deceleration time of the early transmitral flow velocity was  normal. The pulmonary vein flow pattern was normal. The  tissue Doppler parameters were normal. Left ventricular  diastolic function parameters were normal.  ------------------------------------------------------------  Aortic valve:   Structurally normal valve. Trileaflet. Cusp  separation was normal.  Doppler:  Transvalvular velocity was  within the normal range. There was no stenosis.  No regurgitation.  ------------------------------------------------------------  Aorta:  The aorta was normal, not dilated, and non-diseased.  ------------------------------------------------------------  Mitral valve:   Structurally normal valve.   Leaflet  separation was normal.  Doppler:  Transvalvular velocity was  within the normal range. There  was no evidence for stenosis.   Trivial regurgitation.    Peak gradient: 66m Hg (D).  ------------------------------------------------------------  Left atrium:  The atrium was normal in size.  ------------------------------------------------------------  Atrial  septum:  No defect or patent foramen ovale was identified.  ------------------------------------------------------------  Right ventricle:  The cavity size was normal. Wall thickness  was normal. Systolic function was normal.  ------------------------------------------------------------  Pulmonic valve:    Structurally normal valve.   Cusp  separation was normal.  Doppler:  Transvalvular velocity was  within the normal range.  Trivial regurgitation.  ------------------------------------------------------------  Tricuspid valve:   Structurally normal valve.   Leaflet  separation was normal.  Doppler:  Transvalvular velocity was  within the normal range.  Trivial regurgitation.  ------------------------------------------------------------  Pulmonary artery:   The main pulmonary artery was normal-sized.  ------------------------------------------------------------  Right atrium:  The atrium was normal in size.  ------------------------------------------------------------  Pericardium:  The pericardium was normal in appearance.  ------------------------------------------------------------  Systemic veins:  Inferior vena cava: The vessel was normal in size; the  respirophasic diameter changes were in the normal range. ------------------------------------------------------------  Post procedure conclusions  Ascending Aorta:  - The aorta was normal, not dilated, and non-diseased.     Assessment & Plan    NSTEMI He clearly suffered an acute MI yesterday with delayed presentation due to delay in seeking medical attention until today. Chest pain is now resolved and troponin is down-trending. Fortunately, his ECG does not show  evidence of an infarct and he is stable without signs of heart failure. The patient's TIMI risk score is 5, which indicates a 26% risk of all cause mortality, new or recurrent myocardial infarction or need for urgent revascularization in the next 14 days. - NPO tonight for coronary angiography tomorrow - TTE, A1c, lipid profile, BNP tomorrow morning - Monitor on telemetry for tonight - Continue heparin ggt  - Aspirin '81mg'$  pending coronary angiography, then DAPT for at least 1 year.  - Continue home rosuvastatin '40mg'$  - Start metoprolol '25mg'$  BID at least for now - may not tolerate long-term, as he has not in the past due to fatigue. - SL NTG prn chest pain - Oxygenating well on room air  Other chronic conditions were managed per outpatient regimen.   Nutrition: regular diet, NPO at midnight DVT ppx: heparin ggt GI ppx: home Protonix for GERD Advanced Care Planning: Full Code   Signed, Marykay Lex, MD 08/04/2021, 9:52 PM

## 2021-08-04 NOTE — ED Notes (Addendum)
Patient is asking for cardiologist and asking when they're coming to talk to him. Expresses wanting to go home.   RN answered questions and updated on plan of care.

## 2021-08-04 NOTE — Assessment & Plan Note (Addendum)
[name] presented to our clinic today for an episode of chest pain that happened yesterday afternoon.  He was out working in the yard yesterday and after coming inside he felt diffusely sweaty.  He took a shower and even after this remained diffusely sweaty.  A few minutes later he noticed some left chest wall pain.  The pain was not a similar pressure to his prior MI or his other NSTEMI's in the past.  He was unable to describe this pain.  He states he had it 1 time before a few months ago but this resolved on its own.  Upon feeling this pain he took 4x81 mg aspirin as well as 2 nitroglycerin which helped relieve the pain.  The pain then moved to his left axilla he took a few more nitroglycerin the pain eased away and then he felt it in the middle of his back at around the T7 vertebrae.  This pain was also relieved by a few more nitroglycerin.  In total he took around 7-8 nitroglycerin.  He denies any associated shortness of breath, dizziness, lightheadedness, or palpitations.  He denies cough or feelings of acid reflux.  Did check his blood pressure during this event after taking a few nitroglycerin and his blood pressure was 90/60 with a heart rate of 70.  On physical exam he was tachycardic with an S4 heart sound.  No murmurs or rubs.  Lungs were clear to auscultation.  Extremities were warm to touch and without edema.  Blood pressure today 129/84 with a heart rate of 70.  An EKG was performed which showed 80 bpm with a regular rhythm.  A new left axis deviation with loss of Q waves in the inferior leads as well as inverted complexes in aVL and aVF when compared to his prior EKGs. Findings possibly new vs lead misplacement on EKG.  Stat labs were ordered as there was concern for ACS.  The patient did not want to wait for the lab work, he cares for his wife at home and needed to go back to her.  I discussed the risks of leaving at this time including arrhythmias, new cardiac abnormalities, and possibly  death.  He acknowledged the risks of leaving at this time and showed me that he still has nitroglycerin in the event that his chest pain recurs.  His high-sensitivity troponin resulted as 3657.  Patient was called immediately and instructed to go to the emergency room or call 911 if he has chest pain.  First he was hesitant and asked if this could be done outpatient as he feels fine.  I as well as my attending Dr. Heber Meadow Cooper discussed with him the emergent need to present to the emergency department or call 911 as we believe he has had a recent coronary event and that this could worsen at any time.  He agreed to present to the emergency department this evening.  TIMI score of 5 points, 25% risk at 14 days of all cause mortality. Heart score of 7 points, risk of MACE, 50-65%

## 2021-08-04 NOTE — Patient Instructions (Signed)
Thank you, [name] for allowing [place] to provide your care today. Today we discussed.  Chest Pain We will be doing stat blood work, please keep your phone on and I will call with results. If you have any chest pain at all, please call 911 and take a nitroglycerin    I have ordered the following labs for you:   Lab Orders         Troponin T, STAT ([facility])         CBC with Diff         CMP w Anion Gap (STAT/[facility]-performed on-site)      Referrals ordered today:   Referral Orders  No referral(s) requested today     I have ordered the following medication/changed the following medications:   Stop the following medications: There are no discontinued medications.   Start the following medications: No orders of the defined types were placed in this encounter.    Should you have any questions or concerns please call the internal medicine clinic at [phone].    Sanjuana Letters, D.O. [place]

## 2021-08-04 NOTE — ED Triage Notes (Signed)
Pt c/o left sided chest pain that radiates to his back and shoulder blades. Pain started last night, pt reports taking home NTG SL x 4, with some relief of pain. Denies shortness of breath, states he was sweating profusely. Seen by doctor this morning, trop elevated >3000. Pt reports no complaints at this time.

## 2021-08-04 NOTE — ED Provider Notes (Signed)
[facility] EMERGENCY DEPARTMENT Provider Note   CSN: [id number] Arrival date & time: 08/04/21  1837     History  Chief Complaint  Patient presents with   Chest Pain    [name] is a 66 y.o. male.  Patient sent from PCP with elevated troponin.  States he had some chest pain after working in the yard yesterday.  Started around 7:45 PM and felt like a "net" involving his left chest rating to his upper back.  There are some associated shortness of breath and diaphoresis but no nausea or vomiting.  No fever.  No cough.  Throughout the course of the night he took approximately 6 nitroglycerin with partial relief.  His pain has since resolved since approximately 5 AM.  Has not had no further pain all day.  His PCP drew blood work and his troponin was found greater than 3000.  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  He has not had any issues since then. Denies having any chest pain, shortness of breath, abdominal pain or back pain today  The history is provided by the patient.  Chest Pain Associated symptoms: shortness of breath        Home Medications Prior to Admission medications   Medication Sig Start Date End Date Taking? Authorizing Provider  aspirin EC 81 MG tablet Take 81 mg by mouth daily. Swallow whole.    [provider]  fluticasone (FLONASE SENSIMIST) 27.5 MCG/SPRAY nasal spray Place 2 sprays into the nose daily. 03/12/20   Seawell, Jaimie A, DO  ketoconazole (NIZORAL) 2 % shampoo Apply 1 application topically daily. Apply to affected areas and leave on skin for 5 minutes prior to washing off. 02/26/21   Virl Axe, MD  nitroGLYCERIN (NITROSTAT) 0.4 MG SL tablet PLACE 1 TABLET UNDER THE TONGUE 1 5 MINUTES AS NEEDED FOR CHEST PAIN CALL 911 AT 3RD DOSE WITHIN 15 MINUTES 12/11/19   Burnell Blanks, MD  NON FORMULARY Ajwadate - Supplement - Take 1 tablet by mouth daily    [provider]  pantoprazole (PROTONIX) 40  MG tablet Take 1 tablet (40 mg total) by mouth daily. 09/13/20   Mitzi Hansen, MD  rosuvastatin (CRESTOR) 40 MG tablet TAKE 1 TABLET(40 MG) BY MOUTH DAILY 09/13/20   Christian, Rylee, MD  Saw Palmetto 450 MG CAPS Take 450 mg by mouth as needed (as a health supplement).  03/30/13   [provider]  sodium chloride (OCEAN) 0.65 % SOLN nasal spray Place 1 spray into both nostrils daily. Prior to using flonase Patient not taking: Reported on 12/18/2020 04/04/20   Molli Hazard A, DO      Allergies    Amoxicillin, Citalopram hydrobromide, Clarithromycin, Guaifenesin, Penicillins, Streptomycin, and Zetia [ezetimibe]    Review of Systems   Review of Systems  Respiratory:  Positive for shortness of breath.   Cardiovascular:  Positive for chest pain.   all other systems are negative except as noted in the HPI and PMH.    Physical Exam Updated Vital Signs BP (!) 159/105   Pulse 92   Resp 16   SpO2 100%  Physical Exam Vitals and nursing note reviewed.  Constitutional:      General: He is not in acute distress.    Appearance: He is well-developed.  HENT:     Head: Normocephalic and atraumatic.     Mouth/Throat:     Pharynx: No oropharyngeal exudate.  Eyes:     Conjunctiva/sclera:  Conjunctivae normal.     Pupils: Pupils are equal, round, and reactive to light.  Neck:     Comments: No meningismus. Cardiovascular:     Rate and Rhythm: Normal rate and regular rhythm.     Heart sounds: Normal heart sounds. No murmur heard. Pulmonary:     Effort: Pulmonary effort is normal. No respiratory distress.     Breath sounds: Normal breath sounds.  Abdominal:     Palpations: Abdomen is soft.     Tenderness: There is no abdominal tenderness. There is no guarding or rebound.  Musculoskeletal:        General: No tenderness. Normal range of motion.     Cervical back: Normal range of motion and neck supple.  Skin:    General: Skin is warm.  Neurological:     Mental Status: He is alert and  oriented to person, place, and time.     Cranial Nerves: No cranial nerve deficit.     Motor: No abnormal muscle tone.     Coordination: Coordination normal.     Comments:  5/5 strength throughout. CN 2-12 intact.Equal grip strength.   Psychiatric:        Behavior: Behavior normal.     ED Results / Procedures / Treatments   Labs (all labs ordered are listed, but only abnormal results are displayed) Labs Reviewed  BASIC METABOLIC PANEL - Abnormal; Notable for the following components:      Result Value   Glucose, Bld 130 (*)    All other components within normal limits  TROPONIN I (HIGH SENSITIVITY) - Abnormal; Notable for the following components:   Troponin I (High Sensitivity) 2,668 (*)    All other components within normal limits  CBC WITH DIFFERENTIAL/PLATELET  HEPARIN LEVEL (UNFRACTIONATED)  LIPOPROTEIN A (LPA)  LIPID PANEL  HEMOGLOBIN A1C  BRAIN NATRIURETIC PEPTIDE    EKG None  Radiology DG Chest Portable 1 View  Result Date: 08/04/2021 CLINICAL DATA:  Left chest pain EXAM: PORTABLE CHEST 1 VIEW COMPARISON:  03/13/2020 FINDINGS: Lungs are clear.  No pleural effusion or pneumothorax. The heart is normal in size. IMPRESSION: No evidence of acute cardiopulmonary disease. Electronically Signed   By: Julian Hy M.D.   On: 08/04/2021 20:09    Procedures .Critical Care  Performed by: Ezequiel Essex, MD Authorized by: Ezequiel Essex, MD   Critical care provider statement:    Critical care time (minutes):  45   Critical care time was exclusive of:  Separately billable procedures and treating other patients   Critical care was necessary to treat or prevent imminent or life-threatening deterioration of the following conditions: Acute coronary syndrome.   Critical care was time spent personally by me on the following activities:  Development of treatment plan with patient or surrogate, discussions with consultants, evaluation of patient's response to treatment,  examination of patient, ordering and review of laboratory studies, ordering and review of radiographic studies, ordering and performing treatments and interventions, pulse oximetry, re-evaluation of patient's condition and review of old charts   I assumed direction of critical care for this patient from another provider in my specialty: no     Care discussed with: admitting provider       Medications Ordered in ED Medications  aspirin chewable tablet 324 mg (324 mg Oral Given 08/04/21 1936)    ED Course/ Medical Decision Making/ A&P  Medical Decision Making Amount and/or Complexity of Data Reviewed Labs: ordered. Decision-making details documented in ED Course. Radiology: ordered and independent interpretation performed. Decision-making details documented in ED Course. ECG/medicine tests: ordered and independent interpretation performed. Decision-making details documented in ED Course.  Risk OTC drugs. Prescription drug management. Decision regarding hospitalization.    Chest pain yesterday, now resolved.  EKG is sinus rhythm without acute ST changes.  Troponin was elevated greater than 3000 at his doctor's office  On arrival here.  Patient has no chest pain.  His EKG is sinus rhythm without acute ST changes.  Is given aspirin and started on heparin infusion after bolus dose.  Concern for NSTEMI and ACS.  Suspicion for pulmonary embolism, aortic dissection, pneumonia, pneumothorax.  Remains chest pain-free.  Admission discussed with Dr. Marlou Porch.        Final Clinical Impression(s) / ED Diagnoses Final diagnoses:  NSTEMI (non-ST elevated myocardial infarction) [facility])    Rx / [place] Orders ED Discharge Orders     None         Payson Crumby, Annie Main, MD 08/04/21 2137

## 2021-08-04 NOTE — Progress Notes (Signed)
ANTICOAGULATION CONSULT NOTE - Initial Consult  Pharmacy Consult for Heparin Indication: chest pain/ACS  Allergies  Allergen Reactions   Amoxicillin     REACTION: unspecified REACTION: unspecified   Citalopram Hydrobromide     Other reaction(s): DROWSINESS Other reaction(s): DROWSINESS   Clarithromycin     Other reaction(s): DIARRHEA Other reaction(s): DIARRHEA   Guaifenesin     Other reaction(s): HEADACHE Other reaction(s): HEADACHE   Penicillins     Other reaction(s): SWELLING Other reaction(s): SWELLING   Streptomycin     Other reaction(s): CHOKES Other reaction(s): CHOKES   Zetia [Ezetimibe] Other (See Comments)    Nausea, muscle aches, sluggish    Patient Measurements:   Heparin Dosing Weight: 70.6kg  Vital Signs: Temp: 98.3 F (36.8 C) (07/24 1336) Temp Source: Oral (07/24 1336) BP: 159/105 (07/24 1922) Pulse Rate: 92 (07/24 1922)  Labs: Recent Labs    08/04/21 1506 08/04/21 1855  HGB 14.9 14.4  HCT 42.7 41.6  PLT 231 227  CREATININE 1.11  --   TROPONINIHS 3,657*  --     Estimated Creatinine Clearance: 65.4 mL/min (by C-G formula based on SCr of 1.11 mg/dL).   Medical History: Past Medical History:  Diagnosis Date   Back pain    Coronary artery disease    [place] [address] s/p DES to RCA. b. NSTEMI 12/2007 s/p DES to Cx and LAD.   GERD (gastroesophageal reflux disease)    Hyperlipidemia    MYOCARDIAL INFARCTION 06/29/2008   Qualifier: History of  By: Quentin Cornwall CMA, Janett Billow      Medications:  (Not in a hospital admission)  Scheduled:  Infusions:  PRN:   Assessment: 54 yom with a history of MI, HLD, CAD, back pain. Patient is presenting with chest pain. Heparin per pharmacy consult placed for chest pain/ACS.  Seen by IM this morning. hsTrop 3657  Patient is not on anticoagulation prior to arrival.  Hgb 14.9; plt 227  Goal of Therapy:  Heparin level 0.3-0.7 units/ml Monitor platelets by anticoagulation protocol: Yes   Plan:  Give  4000 units bolus x 1 Start heparin infusion at 850 units/hr Check anti-Xa level at 0300 and daily while on heparin Continue to monitor H&H and platelets  Lorelei Pont, PharmD, BCPS 08/04/2021 7:49 PM ED Clinical Pharmacist -  [phone]

## 2021-08-04 NOTE — Telephone Encounter (Signed)
Call from patient c/o of discomfort between his shoulder blades and chest on yesterday.  Sweating a lot.  No pain now.  Had been outside some yesterday and felt had been drinking enough fluids.  Requesting an appointment to come in for labs labs.  Dis not go to the ER yesterday stated the pain was too bad.  No pain now.  Given an appointment for this afternoon at 1:15 PM.

## 2021-08-04 NOTE — ED Notes (Signed)
Cardiologist at bedside.  

## 2021-08-04 NOTE — Progress Notes (Signed)
CC: Chest Pain  HPI:  [name] is a 66 y.o. male living with a history stated below and presents today for chest pain that occurred yesterday and has now resolved. Please see problem based assessment and plan for additional details.  Past Medical History:  Diagnosis Date   Back pain    Coronary artery disease    [place] [address] s/p DES to RCA. b. NSTEMI 12/2007 s/p DES to Cx and LAD.   GERD (gastroesophageal reflux disease)    Hyperlipidemia    MYOCARDIAL INFARCTION 06/29/2008   Qualifier: History of  By: [name] CMA, [name]      Current Outpatient Medications on File Prior to Visit  Medication Sig Dispense Refill   aspirin EC 81 MG tablet Take 81 mg by mouth daily. Swallow whole.     fluticasone (FLONASE SENSIMIST) 27.5 MCG/SPRAY nasal spray Place 2 sprays into the nose daily. 10 g 12   ketoconazole (NIZORAL) 2 % shampoo Apply 1 application topically daily. Apply to affected areas and leave on skin for 5 minutes prior to washing off. 120 mL 0   nitroGLYCERIN (NITROSTAT) 0.4 MG SL tablet PLACE 1 TABLET UNDER THE TONGUE 1 5 MINUTES AS NEEDED FOR CHEST PAIN CALL 911 AT 3RD DOSE WITHIN 15 MINUTES 25 tablet 5   NON FORMULARY [name] - Supplement - Take 1 tablet by mouth daily     pantoprazole (PROTONIX) 40 MG tablet Take 1 tablet (40 mg total) by mouth daily. 90 tablet 3   rosuvastatin (CRESTOR) 40 MG tablet TAKE 1 TABLET(40 MG) BY MOUTH DAILY 90 tablet 3   Saw Palmetto 450 MG CAPS Take 450 mg by mouth as needed (as a health supplement).      sodium chloride (OCEAN) 0.65 % SOLN nasal spray Place 1 spray into both nostrils daily. Prior to using flonase (Patient not taking: Reported on 12/18/2020) 15 mL 0   No current facility-administered medications on file prior to visit.    Family History  Problem Relation Age of Onset   Coronary artery disease Mother    Hypertension Father    Heart attack Paternal Grandfather     Social History   Socioeconomic History   Marital  status: Married    Spouse name: Not on file   Number of children: Not on file   Years of education: Not on file   Highest education level: Not on file  Occupational History   Not on file  Tobacco Use   Smoking status: Never   Smokeless tobacco: Never  Vaping Use   Vaping Use: Never used  Substance and Sexual Activity   Alcohol use: No    Alcohol/week: 0.0 standard drinks of alcohol   Drug use: No   Sexual activity: Not on file  Other Topics Concern   Not on file  Social History Narrative   Not on file   Social Determinants of Health   Financial Resource Strain: Not on file  Food Insecurity: Not on file  Transportation Needs: Not on file  Physical Activity: Not on file  Stress: Not on file  Social Connections: Not on file  Intimate Partner Violence: Not on file    Review of Systems: ROS negative except for what is noted on the assessment and plan.  Vitals:   08/04/21 1336  BP: 129/84  Pulse: 94  Temp: 98.3 F (36.8 C)  TempSrc: Oral  SpO2: 100%  Weight: 155 lb 11.2 oz (70.6 kg)  Height: 6' (1.829 m)  Physical Exam: Constitutional: in no acute distress HENT: normocephalic atraumatic, mucous membranes moist Eyes: conjunctiva non-erythematous Neck: supple Cardiovascular: regular rate and rhythm, no murmurs or rubs. S4 present. No JVD. Non-displaced PMI Pulmonary/Chest: normal work of breathing on room air, lungs clear to auscultation bilaterally Abdominal: soft, non-tender, non-distended MSK: normal bulk and tone. No edema present Neurological: alert & oriented x 3 Skin: warm and dry. Psych: normal mood  Assessment & Plan:   Chest pain [name] presented to our clinic today for an episode of chest pain that happened yesterday afternoon.  He was out working in the yard yesterday and after coming inside he felt diffusely sweaty.  He took a shower and even after this remained diffusely sweaty.  A few minutes later he noticed some left chest wall pain.   The pain was not a similar pressure to his prior MI or his other NSTEMI's in the past.  He was unable to describe this pain.  He states he had it 1 time before a few months ago but this resolved on its own.  Upon feeling this pain he took 4x81 mg aspirin as well as 2 nitroglycerin which helped relieve the pain.  The pain then moved to his left axilla he took a few more nitroglycerin the pain eased away and then he felt it in the middle of his back at around the T7 vertebrae.  This pain was also relieved by a few more nitroglycerin.  In total he took around 7-8 nitroglycerin.  He denies any associated shortness of breath, dizziness, lightheadedness, or palpitations.  He denies cough or feelings of acid reflux.  Did check his blood pressure during this event after taking a few nitroglycerin and his blood pressure was 90/60 with a heart rate of 70.  On physical exam he was tachycardic with an S4 heart sound.  No murmurs or rubs.  Lungs were clear to auscultation.  Extremities were warm to touch and without edema.  Blood pressure today 129/84 with a heart rate of 70.  An EKG was performed which showed 80 bpm with a regular rhythm.  A new left axis deviation with loss of Q waves in the inferior leads as well as inverted complexes in aVL and aVF when compared to his prior EKGs.  Stat labs were ordered as there was concern for ACS.  The patient did not want to wait for the lab work, he cares for his wife at home and needed to go back to her.  I discussed the risks of leaving at this time including arrhythmias, new cardiac abnormalities, and possibly death.  He acknowledged the risks of leaving at this time and showed me that he still has nitroglycerin in the event that his chest pain recurs.  His high-sensitivity troponin resulted as 3657.  Patient was called immediately and instructed to go to the emergency room or call 911 if he has chest pain.  First he was hesitant and asked if this could be done outpatient as he  feels fine.  I as well as my attending Dr. Heber [place] discussed with him the emergent need to present to the emergency department or call 911 as we believe he has had a recent coronary event and that this could worsen at any time.  He agreed to present to the emergency department this evening.  -Encouraged to go to the emergency department emergently or call 911 if chest pain occurs  Patient discussed with Dr. Delano Metz, D.O. [place]  Internal Medicine, PGY-3 Phone: [phone] Date 08/04/2021 Time 5:50 PM

## 2021-08-04 NOTE — Telephone Encounter (Signed)
Pt wants to make provider aware that he will be going to the hospital as requested by PCP due to test results. He would like for a nurse to return call tomorrow. Please advise

## 2021-08-05 ENCOUNTER — Other Ambulatory Visit: Payer: Self-pay

## 2021-08-05 MED ORDER — CLOPIDOGREL BISULFATE 75 MG PO TABS: 75.0000 mg | ORAL_TABLET | Freq: Every day | ORAL | 3 refills | Status: DC

## 2021-08-05 MED ORDER — METOPROLOL TARTRATE 25 MG PO TABS: 25.0000 mg | ORAL_TABLET | Freq: Two times a day (BID) | ORAL | 3 refills | Status: DC

## 2021-08-05 NOTE — ED Provider Notes (Signed)
Alerted by staff that patient is refusing hospitalization.  I did have a lengthy conversation with the patient.  I informed him that he was having a heart attack and leaving the hospital could be fatal.  He expresses understanding.  I mention several times that he needs to stay in the hospital and that leaving could result in his death.  During the conversation, he did not in any way seem incapacitated and definitely understands what I am saying.  He cannot be held against his wishes and therefore will be released [place].  I will write prescriptions for Plavix and Lopressor and instruct him to call 911 for any recurrent chest pain.   Orpah Greek, MD 08/05/21 [phone]

## 2021-08-05 NOTE — ED Notes (Signed)
Cardiologist contacted about patients questions and wanting to speak to him.   Patient notified about Cardiologist response. No further concerns or questions asked

## 2021-08-05 NOTE — Discharge Instructions (Signed)
You are having a heart attack.  You should not leave the hospital.  If you have any worsening chest pain, call 911.  You need to see a cardiologist as soon as possible to have further studies performed.

## 2021-08-05 NOTE — ED Notes (Signed)
Patient expresses the tube station being too loud and can hear hit from across the hall even with the door closed.   RN explained to patient he's still in the ED and should be going upstairs when bed is available where it should be quieter.

## 2021-08-05 NOTE — ED Notes (Signed)
Patient signed AMA form

## 2021-08-06 NOTE — Telephone Encounter (Signed)
Pt called and made an appt for hospital f/u in 10/23. Pt is requesting a call back from nurse for sooner appt if possible.

## 2021-08-06 NOTE — Telephone Encounter (Signed)
Called the patient and offered appointment tomorrow afternoon.  He wants to report that he is taking Plavix and metoprolol 25 mg BID and the metoprolol is making him a little sluggish but he thinks it is helping his heart because today he feels pretty good.  Also taking Crestor and asa.  Pt grateful for the appointment.

## 2021-08-07 ENCOUNTER — Encounter: Payer: Self-pay | Admitting: Cardiovascular Disease

## 2021-08-07 ENCOUNTER — Ambulatory Visit: Payer: BC Managed Care – PPO | Admitting: Cardiovascular Disease

## 2021-08-07 VITALS — BP 122/70 | HR 74 | Ht 72.0 in | Wt 154.0 lb

## 2021-08-07 DIAGNOSIS — I25110 Atherosclerotic heart disease of native coronary artery with unstable angina pectoris: Secondary | ICD-10-CM | POA: Diagnosis not present

## 2021-08-07 DIAGNOSIS — E7800 Pure hypercholesterolemia, unspecified: Secondary | ICD-10-CM

## 2021-08-07 MED ORDER — CLOPIDOGREL BISULFATE 75 MG PO TABS: 75.0000 mg | ORAL_TABLET | Freq: Every day | ORAL | 3 refills | Status: DC

## 2021-08-07 MED ORDER — METOPROLOL TARTRATE 25 MG PO TABS: 25.0000 mg | ORAL_TABLET | Freq: Two times a day (BID) | ORAL | 3 refills | Status: DC

## 2021-08-07 NOTE — Progress Notes (Signed)
Chief Complaint  Patient presents with   Follow-up    Chest pain, CAD   History of Present Illness: 66 yo male with history of CAD, OSA, hyperlipidemia here today for cardiac follow up. 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 In December 2009 he had a non-ST elevation MI treated with drug-eluting stents in the circumflex and LAD. He had a follow-up catheterization in February 2010 which showed nonobstructive disease. He was seen in our office May 2017 and he was having fatigue with low BP and chest burning while mowing the grass. Nuclear stress test 05/29/15 with no ischemia. Toprol was stopped and his fatigue resolved. He was seen in our office in July 2020 and had c/o chest pain. Nuclear stress test August 2020 with no ischemia. He had been working in the yard that day in the heat. He was sweaty and began to notice "pulling" over his left chest and left abdomen. He took an ASA and NTG and the pain resolved. He saw primary care on 08/04/21 and troponin was elevated. EKG did not show ischemic changes. He was sent to the ED and his troponin was trending down. EKG with no ischemic changes. He was seen by cardiology and refused admission. He left against medical advice.   He is here today for follow up. The patient denies any further chest pain, dyspnea, palpitations, lower extremity edema, orthopnea, PND, dizziness, near syncope or syncope.    Primary Care Physician: Lottie Mussel, MD  Past Medical History:  Diagnosis Date   Back pain    Coronary artery [place]    [place] [address] s/p DES to RCA. b. NSTEMI 12/2007 s/p DES to Cx and LAD.   GERD (gastroesophageal reflux disease)    Hyperlipidemia    MYOCARDIAL INFARCTION 06/29/2008   Qualifier: History of  By: Quentin Cornwall CMA, Janett Billow      Past Surgical History:  Procedure Laterality Date   TONSILLECTOMY      Current Outpatient Medications  Medication Sig Dispense Refill   aspirin EC 81  MG tablet Take 81 mg by mouth daily. Swallow whole.     fluticasone (FLONASE SENSIMIST) 27.5 MCG/SPRAY nasal spray Place 2 sprays into the nose daily. 10 g 12   ketoconazole (NIZORAL) 2 % shampoo Apply 1 application topically daily. Apply to affected areas and leave on skin for 5 minutes prior to washing off. 120 mL 0   nitroGLYCERIN (NITROSTAT) 0.4 MG SL tablet PLACE 1 TABLET UNDER THE TONGUE 1 5 MINUTES AS NEEDED FOR CHEST PAIN CALL 911 AT 3RD DOSE WITHIN 15 MINUTES (Patient taking differently: Place 0.4 mg under the tongue every 5 (five) minutes as needed for chest pain.) 25 tablet 5   NON FORMULARY Take 1 tablet by mouth daily. Medication: Ajwadate - Supplement     pantoprazole (PROTONIX) 40 MG tablet Take 1 tablet (40 mg total) by mouth daily. 90 tablet 3   prednisoLONE acetate (PRED FORTE) 1 % ophthalmic suspension Place 1 drop into both eyes daily.     rosuvastatin (CRESTOR) 40 MG tablet TAKE 1 TABLET(40 MG) BY MOUTH DAILY (Patient taking differently: Take 40 mg by mouth daily.) 90 tablet 3   Saw Palmetto 450 MG CAPS Take 450 mg by mouth as needed (as a health supplement).      sodium chloride (OCEAN) 0.65 % SOLN nasal spray Place 1 spray into both nostrils daily. Prior to using flonase 15 mL 0  clopidogrel (PLAVIX) 75 MG tablet Take 1 tablet (75 mg total) by mouth daily. 90 tablet 3   metoprolol tartrate (LOPRESSOR) 25 MG tablet Take 1 tablet (25 mg total) by mouth 2 (two) times daily. 180 tablet 3   No current facility-administered medications for this visit.    Allergies  Allergen Reactions   Amoxicillin     REACTION: unspecified REACTION: unspecified   Citalopram Hydrobromide     Other reaction(s): DROWSINESS Other reaction(s): DROWSINESS   Clarithromycin     Other reaction(s): DIARRHEA Other reaction(s): DIARRHEA   Guaifenesin     Other reaction(s): HEADACHE Other reaction(s): HEADACHE   Penicillins     Other reaction(s): SWELLING Other reaction(s): SWELLING    Streptomycin     Other reaction(s): CHOKES Other reaction(s): CHOKES   Zetia [Ezetimibe] Other (See Comments)    Nausea, muscle aches, sluggish    Social History   Socioeconomic History   Marital status: Married    Spouse name: Not on file   Number of children: Not on file   Years of education: Not on file   Highest education level: Not on file  Occupational History   Not on file  Tobacco Use   Smoking status: Never   Smokeless tobacco: Never  Vaping Use   Vaping Use: Never used  Substance and Sexual Activity   Alcohol use: No    Alcohol/week: 0.0 standard drinks of alcohol   Drug use: No   Sexual activity: Not on file  Other Topics Concern   Not on file  Social History Narrative   Not on file   Social Determinants of Health   Financial Resource Strain: Not on file  Food Insecurity: Not on file  Transportation Needs: Not on file  Physical Activity: Not on file  Stress: Not on file  Social Connections: Not on file  Intimate Partner Violence: Not on file    Family History  Problem Relation Age of Onset   Coronary artery disease Mother    Hypertension Father    Heart attack Paternal Grandfather     Review of Systems:  As stated in the HPI and otherwise negative.   BP 122/70   Pulse 74   Ht 6' (1.829 m)   Wt 154 lb (69.9 kg)   SpO2 98%   BMI 20.89 kg/m   Physical Examination:  General: Well developed, well nourished, NAD  HEENT: OP clear, mucus membranes moist  SKIN: warm, dry. No rashes. Neuro: No focal deficits  Musculoskeletal: Muscle strength 5/5 all ext  Psychiatric: Mood and affect normal  Neck: No JVD, no carotid bruits, no thyromegaly, no lymphadenopathy.  Lungs:Clear bilaterally, no wheezes, rhonci, crackles Cardiovascular: Regular rate and rhythm. No murmurs, gallops or rubs. Abdomen:Soft. Bowel sounds present. Non-tender.  Extremities: No lower extremity edema. Pulses are 2 + in the bilateral DP/PT.  EKG:  EKG is not ordered today. The  ekg ordered today demonstrates   Recent Labs: 08/04/2021: ALT 22; BUN 9; Creatinine, Ser 1.08; Hemoglobin 14.4; Platelets 227; Potassium 3.5; Sodium 138   Lipid Panel    Component Value Date/Time   CHOL 176 03/18/2021 0837   TRIG 84 03/18/2021 0837   HDL 57 03/18/2021 0837   CHOLHDL 3.1 03/18/2021 0837   CHOLHDL 4 04/18/2014 1613   VLDL 18.8 04/18/2014 1613   LDLCALC 103 (H) 03/18/2021 0837   LDLDIRECT 146.2 11/20/2009 0907   LABVLDL 16 03/18/2021 0837     Wt Readings from Last 3 Encounters:  08/07/21 154 lb (69.9  kg)  08/04/21 155 lb 11.2 oz (70.6 kg)  04/16/21 154 lb 12.8 oz (70.2 kg)     Other studies Reviewed: Additional studies/ records that were reviewed today include:  Review of the above records demonstrates:   Assessment and Plan:   1. CAD with unstable angina: Recent episode of chest pressure after working in the yard in the heat. He was found to have a NSTEMI 24 hours later with down trending troponin at that time. He refused inpatient workup from the ED as his pain resolved. He was started on Plavix and metoprolol. He has felt great over the past three days. He does not wish to have a cardiac cath. Continue ASA, Plavix, statin and beta blocker. He will call with any change in his symptoms.     2. Hyperlipidemia:  Continue Crestor.   Current medicines are reviewed at length with the patient today.  The patient does not have concerns regarding medicines.  The following changes have been made:  no change  Labs/ tests ordered today include:   No orders of the defined types were placed in this encounter.    Disposition:   F/U with me in 6  months  Signed, Lauree Chandler, MD 08/07/2021 2:30 PM    [address], [place], [place]  [address] Phone: [phone]; Fax: [phone]

## 2021-08-07 NOTE — Patient Instructions (Signed)
Medication Instructions:  No changes *If you need a refill on your cardiac medications before your next appointment, please call your pharmacy*   Lab Work: none If you have labs (blood work) drawn today and your tests are completely normal, you will receive your results only by: [place] (if you have MyChart) OR A paper copy in the mail If you have any lab test that is abnormal or we need to change your treatment, we will call you to review the results.   Testing/Procedures: none   Follow-Up: At [facility], you and your health needs are our priority.  As part of our continuing mission to provide you with exceptional heart care, we have created designated Provider Care Teams.  These Care Teams include your primary Cardiologist (physician) and Advanced Practice Providers (APPs -  Physician Assistants and Nurse Practitioners) who all work together to provide you with the care you need, when you need it.  We recommend signing up for the patient portal called "MyChart".  Sign up information is provided on this After Visit Summary.  MyChart is used to connect with patients for Virtual Visits (Telemedicine).  Patients are able to view lab/test results, encounter notes, upcoming appointments, etc.  Non-urgent messages can be sent to your provider as well.   To learn more about what you can do with MyChart, go to [email or web address].    Your next appointment:   6 month(s)  The format for your next appointment:   In Person  Provider:   Lauree Chandler, MD     Important Information About Sugar

## 2021-08-08 ENCOUNTER — Other Ambulatory Visit: Payer: Self-pay

## 2021-08-08 DIAGNOSIS — R059 Cough, unspecified: Secondary | ICD-10-CM

## 2021-08-08 MED ORDER — FLONASE SENSIMIST 27.5 MCG/SPRAY NA SUSP: 2.0000 | Freq: Every day | NASAL | 12 refills | Status: DC

## 2021-08-14 ENCOUNTER — Telehealth: Payer: Self-pay | Admitting: Cardiovascular Disease

## 2021-08-14 MED ORDER — NITROGLYCERIN 0.4 MG SL SUBL: 0.4000 mg | SUBLINGUAL_TABLET | SUBLINGUAL | 5 refills | Status: AC | PRN

## 2021-08-14 NOTE — Telephone Encounter (Signed)
I spoke with the patient.  He requested a ntg refill which I have done.  He also wanted to let Dr. Angelena Form know that he received a notice that his insurance will not be paying for his stay at the ER.  He did say he signed the AMA form but was not informed that doing so would cause his stay to not be covered.  I offered to connect him with someone from our billing department but he said he did not need to speak w anyone at this time.

## 2021-08-14 NOTE — Telephone Encounter (Signed)
Patient would like to speak to Dr. Angelena Form nurse. Wouldn't go into further detail.

## 2021-08-26 NOTE — Progress Notes (Unsigned)
[facility]: Clinic Note  Subjective:  History of Present Illness: [name] is a 66 y.o. year old male who presents for follow-up after recent NSTEMI. He is a new patient to me, not new to the clinic.  I reviewed clinic & ER visits from 08/04/21. Briefly, he presented on 7/24 to clinic and reported having chest pain while doing yard work the day prior, which had since resolved. His troponin was elevated to 3000, so we called him back, and instructed him to go to the ER. In ER, his EKG was reassuring and troponin downtrending. Cardiology recommended LHC, but patient opted for medical management, and was discharged on aspirin, plavix, statin, and beta blocker. He followed up with cardiology.  Since then, he has not had any chest pain, dyspnea, leg swelling, or palpitations. He's taking all medicines as prescribed. He feels a little sluggish on the metoprolol, so is hoping he can decrease or stop this medicine in the future.   Other mild issues we discussed today- He sometimes has difficulty urinating, but has found green tea to be helpful relieving these symptoms. Not bothersome at the moment, no dysuria.  He has had intermittent bilateral toe numbness for years. No recent changes, no pain, no weakness.    Please refer to Assessment and Plan below for full details in Problem-Based Charting.   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 Swallow whole., Disp: , Rfl:    clopidogrel (PLAVIX) 75 MG tablet, Take 1 tablet (75 mg total) by mouth daily., Disp: 90 tablet, Rfl: 3   fluticasone (FLONASE SENSIMIST) 27.5 MCG/SPRAY nasal spray, Place 2 sprays into the nose daily., Disp: 10 g, Rfl: 12   fluticasone (FLONASE) 50 MCG/ACT nasal spray, Place 2 sprays into both nostrils daily., Disp: , Rfl:    ketoconazole (NIZORAL) 2 % shampoo, Apply 1 application topically daily. Apply to affected areas and leave on skin for 5 minutes prior to washing off., Disp: 120 mL, Rfl: 0   metoprolol tartrate (LOPRESSOR) 25 MG tablet, Take 1 tablet (25 mg total) by mouth 2 (two) times daily., Disp: 180 tablet, Rfl: 3   nitroGLYCERIN (NITROSTAT) 0.4 MG SL tablet, Place 1 tablet (0.4 mg total) under the tongue every 5 (five) minutes as needed for chest pain (up to 3 doses in 15 min per episode of chest pain and call 911 if you take 3rd dose in 15 min.)., Disp: 25 tablet, Rfl: 5   NON FORMULARY, Take 1 tablet by mouth daily. Medication: Ajwadate - Supplement, Disp: , Rfl:    pantoprazole (PROTONIX) 40 MG tablet, Take 1 tablet (40 mg total) by mouth daily., Disp: 90 tablet, Rfl: 3   prednisoLONE acetate (PRED FORTE) 1 % ophthalmic suspension, Place 1 drop into both eyes daily., Disp: , Rfl:    rosuvastatin (CRESTOR) 40 MG tablet, TAKE 1 TABLET(40 MG) BY MOUTH DAILY (Patient  taking differently: Take 40 mg by mouth daily.), Disp: 90 tablet, Rfl: 3   Saw Palmetto 450 MG CAPS, Take 450 mg by mouth as needed (as a health supplement). , Disp: , Rfl:    sodium chloride (OCEAN) 0.65 % SOLN nasal spray, Place 1 spray into both nostrils daily. Prior to using flonase, Disp: 15 mL, Rfl: 0   Allergies: Allergies  Allergen Reactions   Amoxicillin     REACTION: unspecified REACTION: unspecified   Citalopram Hydrobromide     Other reaction(s): DROWSINESS Other reaction(s): DROWSINESS    Clarithromycin     Other reaction(s): DIARRHEA Other reaction(s): DIARRHEA   Guaifenesin     Other reaction(s): HEADACHE Other reaction(s): HEADACHE   Penicillins     Other reaction(s): SWELLING Other reaction(s): SWELLING   Streptomycin     Other reaction(s): CHOKES Other reaction(s): CHOKES   Zetia [Ezetimibe] Other (See Comments)    Nausea, muscle aches, sluggish     Objective:   Vitals: Vitals:   08/27/21 1030  BP: 124/82  Pulse: 73  Temp: 97.6 F (36.4 C)  SpO2: 100%    Physical Exam: Physical Exam Constitutional:      Appearance: Normal appearance.  Cardiovascular:     Rate and Rhythm: Normal rate and regular rhythm.     Pulses: Normal pulses.     Heart sounds: Normal heart sounds. No murmur heard. Pulmonary:     Effort: Pulmonary effort is normal. No respiratory distress.     Breath sounds: Normal breath sounds. No wheezing or rales.  Musculoskeletal:     Right lower leg: No edema.     Left lower leg: No edema.  Skin:    General: Skin is warm and dry.  Neurological:     Mental Status: He is alert.      Data: Labs, imaging, and micro were reviewed in Epic. Refer to Assessment and Plan below for full details in Problem-Based Charting.  Assessment & Plan:  CAD (coronary artery disease) - No current symptoms - Continue aspirin '81mg'$  daily, plavix '75mg'$  daily, Metop '25mg'$  BID, Rosuvastatin '40mg'$  daily - Follow up with Cardiology in 01/2022 for 45-monthfollow-up - Nitro sl prn chest pain, he has not needed this since discharge   Mixed hyperlipidemia - LDL >70 on Crestor '40mg'$  daily - He did not tolerate Zetia - Dr cDarrick Meigshad discussed PCSK9 with patient at last visit, and he wants to continue with diet & lifestyle changes at this time. Plan to repeat lipids at next visit, and revisit PCSK9 for secondary prevention if LDL still not at goal on max crestor   Healthcare maintenance - Patient will get flu shot this fall      Patient will follow up in 6  months and prn   JLottie Mussel MD

## 2021-08-27 ENCOUNTER — Ambulatory Visit: Payer: BC Managed Care – PPO | Admitting: Internal Medicine

## 2021-08-27 ENCOUNTER — Encounter: Payer: Self-pay | Admitting: Internal Medicine

## 2021-08-27 VITALS — BP 124/82 | HR 73 | Temp 97.6°F | Ht 72.0 in | Wt 154.8 lb

## 2021-08-27 DIAGNOSIS — I2510 Atherosclerotic heart disease of native coronary artery without angina pectoris: Secondary | ICD-10-CM

## 2021-08-27 DIAGNOSIS — E782 Mixed hyperlipidemia: Secondary | ICD-10-CM

## 2021-08-27 DIAGNOSIS — Z0000 Encounter for general adult medical examination without abnormal findings: Secondary | ICD-10-CM

## 2021-08-27 NOTE — Assessment & Plan Note (Signed)
-   Patient will get flu shot this fall

## 2021-08-27 NOTE — Patient Instructions (Addendum)
Dear Mr [name],  It was a pleasure seeing you in clinic today.  We are not making any major changes - keep taking your medicines as you are.  Your lab results from the last visits are shown here:  Recent Results (from the past 2160 hour(s))  CBC with Diff     Status: None   Collection Time: 08/04/21  3:06 PM  Result Value Ref Range   WBC 8.1 4.0 - 10.5 K/uL   RBC 4.92 4.22 - 5.81 MIL/uL   Hemoglobin 14.9 13.0 - 17.0 g/dL   HCT 42.7 39.0 - 52.0 %   MCV 86.8 80.0 - 100.0 fL   MCH 30.3 26.0 - 34.0 pg   MCHC 34.9 30.0 - 36.0 g/dL   RDW 12.9 11.5 - 15.5 %   Platelets 231 150 - 400 K/uL   nRBC 0.0 0.0 - 0.2 %   Neutrophils Relative % 67 %   Neutro Abs 5.5 1.7 - 7.7 K/uL   Lymphocytes Relative 19 %   Lymphs Abs 1.5 0.7 - 4.0 K/uL   Monocytes Relative 12 %   Monocytes Absolute 1.0 0.1 - 1.0 K/uL   Eosinophils Relative 1 %   Eosinophils Absolute 0.1 0.0 - 0.5 K/uL   Basophils Relative 1 %   Basophils Absolute 0.1 0.0 - 0.1 K/uL   Immature Granulocytes 0 %   Abs Immature Granulocytes 0.02 0.00 - 0.07 K/uL    Comment: Performed at [facility], [address] [place], [place] [address]  CMP w Anion Gap (STAT/Sunquest-performed on-site)     Status: Abnormal   Collection Time: 08/04/21  3:06 PM  Result Value Ref Range   Sodium 140 135 - 145 mmol/L   Potassium 3.5 3.5 - 5.1 mmol/L   Chloride 103 98 - 111 mmol/L   CO2 27 22 - 32 mmol/L   Glucose, Bld 108 (H) 70 - 99 mg/dL    Comment: Glucose reference range applies only to samples taken after fasting for at least 8 hours.   BUN 9 8 - 23 mg/dL   Creatinine, Ser 1.11 0.61 - 1.24 mg/dL   Calcium 9.7 8.9 - 10.3 mg/dL   Total Protein 7.5 6.5 - 8.1 g/dL   Albumin 4.9 3.5 - 5.0 g/dL   AST 38 15 - 41 U/L   ALT 22 0 - 44 U/L   Alkaline Phosphatase 56 38 - 126 U/L   Total Bilirubin 0.8 0.3 - 1.2 mg/dL   GFR, Estimated >60 >60 mL/min    Comment: (NOTE) Calculated using the CKD-EPI Creatinine Equation (2021)    Anion gap 10 5 -  15    Comment: Performed at [facility] [address] [place], [place] [address]  Troponin I (High Sensitivity)     Status: Abnormal   Collection Time: 08/04/21  3:06 PM  Result Value Ref Range   Troponin I (High Sensitivity) 3,657 (HH) <18 ng/L    Comment: CRITICAL RESULT CALLED TO, READ BACK BY AND VERIFIED WITH DR.HOFFMAN '@1622'$  08/04/2021 VANG.J (NOTE) Elevated high sensitivity troponin I (hsTnI) values and significant  changes across serial measurements may suggest ACS but many other  chronic and acute conditions are known to elevate hsTnI results.  Refer to the "Links" section for chest pain algorithms and additional  guidance. Performed at [facility], [place] [address], [place], [place] [address]   CBC with Differential     Status: None   Collection Time: 08/04/21  6:55 PM  Result Value Ref Range   WBC 7.2 4.0 - 10.5 K/uL   RBC 4.80 4.22 - 5.81 MIL/uL   Hemoglobin 14.4 13.0 - 17.0 g/dL   HCT 41.6 39.0 - 52.0 %   MCV 86.7 80.0 - 100.0 fL   MCH 30.0 26.0 - 34.0 pg   MCHC 34.6 30.0 - 36.0 g/dL   RDW 12.8 11.5 - 15.5 %   Platelets 227 150 - 400 K/uL   nRBC 0.0 0.0 - 0.2 %   Neutrophils Relative % 68 %   Neutro Abs 4.9 1.7 - 7.7 K/uL   Lymphocytes Relative 19 %   Lymphs Abs 1.4 0.7 - 4.0 K/uL   Monocytes Relative 11 %   Monocytes Absolute 0.8 0.1 - 1.0 K/uL   Eosinophils Relative 1 %   Eosinophils Absolute 0.1 0.0 - 0.5 K/uL   Basophils Relative 1 %   Basophils Absolute 0.0 0.0 - 0.1 K/uL   Immature Granulocytes 0 %   Abs Immature Granulocytes 0.01 0.00 - 0.07 K/uL    Comment: Performed at [facility], [address] [place], [place] [address]  Basic metabolic panel     Status: Abnormal   Collection Time: 08/04/21  6:55 PM  Result Value Ref Range   Sodium 138 135 - 145 mmol/L   Potassium 3.5 3.5 - 5.1 mmol/L   Chloride 104 98 - 111 mmol/L   CO2 26 22 - 32 mmol/L   Glucose, Bld 130 (H) 70 - 99 mg/dL    Comment: Glucose reference range applies  only to samples taken after fasting for at least 8 hours.   BUN 9 8 - 23 mg/dL   Creatinine, Ser 1.08 0.61 - 1.24 mg/dL   Calcium 9.5 8.9 - 10.3 mg/dL   GFR, Estimated >60 >60 mL/min    Comment: (NOTE) Calculated using the CKD-EPI Creatinine Equation (2021)    Anion gap 8 5 - 15    Comment: Performed at [facility] [address], [place], [place] [address]  Troponin I (High Sensitivity)     Status: Abnormal   Collection Time: 08/04/21  6:55 PM  Result Value Ref Range   Troponin I (High Sensitivity) 2,668 (HH) <18 ng/L    Comment: CRITICAL VALUE NOTED.  VALUE IS CONSISTENT WITH PREVIOUSLY REPORTED AND CALLED VALUE. (NOTE) Elevated high sensitivity troponin I (hsTnI) values and significant  changes across serial measurements may suggest ACS but many other  chronic and acute conditions are known to elevate hsTnI results.  Refer to the "Links" section for chest pain algorithms and additional  guidance. Performed at [facility], [place] [address] [place], [place] [address]      As we discussed, your troponins measure stress on your heart, and these were high. All other labs looked good - there were no other concerning findings. Your chest x-ray was also normal.  We'll see you back in 6 months after you see Cardiology.  Kindly, Dr. Lottie Mussel

## 2021-08-27 NOTE — Assessment & Plan Note (Signed)
-   No current symptoms - Continue aspirin '81mg'$  daily, plavix '75mg'$  daily, Metop '25mg'$  BID, Rosuvastatin '40mg'$  daily - Follow up with Cardiology in 01/2022 for 37-monthfollow-up - Nitro sl prn chest pain, he has not needed this since discharge

## 2021-08-27 NOTE — Assessment & Plan Note (Signed)
-   LDL >70 on Crestor '40mg'$  daily - He did not tolerate Zetia - Dr Darrick Meigs had discussed PCSK9 with patient at last visit, and he wants to continue with diet & lifestyle changes at this time. Plan to repeat lipids at next visit, and revisit PCSK9 for secondary prevention if LDL still not at goal on max crestor

## 2021-10-01 ENCOUNTER — Other Ambulatory Visit: Payer: Self-pay

## 2021-10-01 DIAGNOSIS — E782 Mixed hyperlipidemia: Secondary | ICD-10-CM

## 2021-10-01 MED ORDER — ROSUVASTATIN CALCIUM 40 MG PO TABS: ORAL_TABLET | ORAL | 3 refills | Status: DC

## 2021-10-01 NOTE — Telephone Encounter (Signed)
rosuvastatin (CRESTOR) 40 MG tablet, refill request @ [facility] [phone] - [place], [place] AT [address].

## 2021-10-06 ENCOUNTER — Telehealth: Payer: Self-pay | Admitting: Cardiovascular Disease

## 2021-10-06 ENCOUNTER — Encounter: Payer: Self-pay | Admitting: *Deleted

## 2021-10-06 MED ORDER — METOPROLOL TARTRATE 25 MG PO TABS: 12.5000 mg | ORAL_TABLET | Freq: Two times a day (BID) | ORAL | 3 refills | Status: DC

## 2021-10-06 NOTE — Telephone Encounter (Signed)
Called and spoke w patient. He has requested a note to be out of work due to his ongoing sluggishness on metoprolol 25 mg BID.  He is also having blurry vision related to cataracts for which he is expecting to have surgery next week.  This is his last week of work before he [employer].  He is going to decrease metoprolol 25 mg BID to 12.5 mg BID and monitor BP.  He will let us know if if consistently getting readings greater than 140/90 or HR >100.  I reviewed with Dr. Lonna Cobb who reports it is okay to write him out of work for the week, and agrees w dose decrease with monitoring BP/HR.  The patient confirmed email addresses provided in previous note and requests that he receive a copy via email as well at [email or web address].  letters sent.

## 2021-10-06 NOTE — Telephone Encounter (Signed)
Patient called and said that he needed a medical note for work. Need email sent to work. [email or web address] or [email or web address]. patient said he has a lot to deal with health wise. Please call back

## 2021-10-06 NOTE — Telephone Encounter (Deleted)
Strips received and printed.  Placed on cart for MD review upon return to office.

## 2021-10-09 ENCOUNTER — Other Ambulatory Visit: Payer: Self-pay

## 2021-10-09 DIAGNOSIS — K219 Gastro-esophageal reflux disease without esophagitis: Secondary | ICD-10-CM

## 2021-10-09 MED ORDER — PANTOPRAZOLE SODIUM 40 MG PO TBEC: 40.0000 mg | DELAYED_RELEASE_TABLET | Freq: Every day | ORAL | 3 refills | Status: DC

## 2021-10-09 NOTE — Telephone Encounter (Signed)
pantoprazole (PROTONIX) 40 MG tablet, refill request.

## 2021-10-15 ENCOUNTER — Telehealth: Payer: Self-pay | Admitting: *Deleted

## 2021-10-15 NOTE — Telephone Encounter (Signed)
   Pre-operative Risk Assessment    Patient Name: [name]  DOB: [birth year] MRN: [id number]      Request for Surgical Clearance    Procedure:   CATARACT EXTRACTION BY PE, IOL-LEFT EYE  Date of Surgery:  Clearance TBD                                 Surgeon:  DR. Tama High Surgeon's Group or Practice Name:  [facility]  Phone number:  [phone] Fax number:  [phone]   Type of Clearance Requested:   - Medical ; PER CLEARANCE REQUEST THE PT WILL NOT NEED TO STOP ANY MEDICATIONS, INCLUDING PLAVIX    Type of Anesthesia:   IV SEDATION   Additional requests/questions:    Jiles Prows   10/15/2021, 5:58 PM

## 2021-10-16 NOTE — Telephone Encounter (Signed)
   Patient Name: [name]  DOB: Jan 10, 1956 MRN: [id number]  Primary Cardiologist: Lauree Chandler, MD  Chart reviewed as part of pre-operative protocol coverage. Cataract extractions are recognized in guidelines as low risk surgeries that do not typically require specific preoperative testing or holding of blood thinner therapy. Therefore, given past medical history and time since last visit, based on ACC/AHA guidelines, [name] would be at acceptable risk for the planned procedure without further cardiovascular testing.   I will route this recommendation to the requesting party via Epic fax function and remove from pre-op pool.  Please call with questions.  Deberah Pelton, NP 10/16/2021, 10:28 AM

## 2021-10-22 ENCOUNTER — Ambulatory Visit (INDEPENDENT_AMBULATORY_CARE_PROVIDER_SITE_OTHER): Payer: Medicare Other | Admitting: *Deleted

## 2021-10-22 DIAGNOSIS — Z23 Encounter for immunization: Secondary | ICD-10-CM

## 2021-11-03 ENCOUNTER — Ambulatory Visit: Payer: BC Managed Care – PPO | Admitting: Cardiovascular Disease

## 2021-11-11 ENCOUNTER — Encounter: Payer: Self-pay | Admitting: Student

## 2021-11-11 ENCOUNTER — Ambulatory Visit (INDEPENDENT_AMBULATORY_CARE_PROVIDER_SITE_OTHER): Payer: Medicare Other | Admitting: Student

## 2021-11-11 DIAGNOSIS — I2510 Atherosclerotic heart disease of native coronary artery without angina pectoris: Secondary | ICD-10-CM

## 2021-11-11 DIAGNOSIS — J069 Acute upper respiratory infection, unspecified: Secondary | ICD-10-CM

## 2021-11-11 MED ORDER — BENZONATATE 100 MG PO CAPS: 100.0000 mg | ORAL_CAPSULE | Freq: Three times a day (TID) | ORAL | 0 refills | Status: DC | PRN

## 2021-11-11 NOTE — Assessment & Plan Note (Signed)
Patient presents with 3d of sore throat and dry cough. He denies any fever, myalgias, chills, dyspnea, or chest pain. He does note that recent home remedies of bourbon/honey and olive oil as well as flonase have helped his symptoms. He would however like a prescription for tessalon perles as these helped a cough he had in the past.   Patient has not taken a COVID test but has no shortness of breath.   Plan:  Continue symptom management with tessalon perles and patient's home remedies. He will give our office a call in 4 d if his symptoms do not improve.

## 2021-11-11 NOTE — Progress Notes (Signed)
  [facility] Internal Medicine Residency Telephone Encounter Continuity Care Appointment  HPI:  This telephone encounter was created for [name] with past medical history per below on 11/11/2021 for the following purpose/cc sore throat/cough.  Patient states that he developed a sore throat and dry cough 2 days prior to today's telehealth visit.  He states that dry cough is most bothersome symptom as he has paroxysms where the cough lasts for several seconds.  He recently tried honey with bourbon as well as olive oil, which she states has helped with his cough.  He denies any chest pain shortness of breath.  He also denies subjective fevers, chills, or myalgias.  He has been vaccinated for COVID and flu.  Patient states he had similar symptoms about a year ago and tried Flonase as well as Best boy and his symptoms resolved.  He would like to try this again.   Past Medical History:  Past Medical History:  Diagnosis Date   Back pain    Coronary artery disease    [place] [address] s/p DES to RCA. b. NSTEMI 12/2007 s/p DES to Cx and LAD. c. 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    Assessment / Plan / Recommendations:  Please see A&P under problem oriented charting for assessment of the patient's acute and chronic medical conditions.  As always, pt is advised that if symptoms worsen or new symptoms arise, they should go to an urgent care facility or to to ER for further evaluation.   Consent and Medical Decision Making:  Patient discussed with Dr. Jimmye Norman This is a telephone encounter between [name] and [name] on 11/11/2021 for upper respiratory tract infection. The visit was conducted with the patient located at home and [name] at [facility]. The patient's identity was confirmed using their DOB  and current address. The patient has consented to being evaluated through a telephone encounter and understands the associated risks (an examination cannot be done and the patient may need to come in for an appointment) / benefits (allows the patient to remain at home, decreasing exposure to coronavirus). I personally spent 15 minutes on medical discussion.

## 2021-11-13 ENCOUNTER — Telehealth: Payer: Self-pay | Admitting: Internal Medicine

## 2021-11-13 NOTE — Telephone Encounter (Signed)
Opened in Error.

## 2021-11-18 NOTE — Progress Notes (Signed)
Internal Medicine Clinic Attending  Case discussed with Dr. Carter  At the time of the visit.  We reviewed the resident's history and exam and pertinent patient test results.  I agree with the assessment, diagnosis, and plan of care documented in the resident's note.  

## 2021-11-25 ENCOUNTER — Telehealth: Payer: Self-pay | Admitting: *Deleted

## 2021-11-25 ENCOUNTER — Other Ambulatory Visit: Payer: Self-pay | Admitting: Student

## 2021-11-25 DIAGNOSIS — J069 Acute upper respiratory infection, unspecified: Secondary | ICD-10-CM

## 2021-11-25 NOTE — Telephone Encounter (Signed)
Patient requesting a call back as he is still coughing and the medicine does not seem to be working. Did not give the name of the medicine.

## 2021-11-25 NOTE — Telephone Encounter (Signed)
Patient called back. States it feels like his cough "lasts forever." Cough is intermittently productive of white sputum. Denies fever, chills, SHOB. States [employer] "help tremendously in calming the cough down." Only has 7 caps left. Would like refill if Dr. Eulas Post thinks that is appropriate. He is also using Delsym, and tea with lemon and honey. He thinks cough may also be related to allergies and is asking for refill on Flonase. Explained 12 refills were sent on 08/08/21. He will call [facility] now for refill. Discussed ensuring proper fluid intake, especially water to thin secretions. Also, explained viral cough can last up to 6 weeks. He would like to know if there is anything else Dr. Eulas Post would suggest.

## 2021-11-25 NOTE — Telephone Encounter (Signed)
Called pt - stated he will the office back.

## 2021-11-26 MED ORDER — BENZONATATE 100 MG PO CAPS: 100.0000 mg | ORAL_CAPSULE | Freq: Three times a day (TID) | ORAL | 0 refills | Status: AC | PRN

## 2021-11-26 NOTE — Telephone Encounter (Signed)
I called and talked with patient. He is feeling better but has persistent cough. He denies fevers, chills, and shortness of breath. P: Continue flonase Refill of tessalon pearls sent

## 2021-12-10 ENCOUNTER — Telehealth: Payer: Self-pay | Admitting: *Deleted

## 2021-12-10 NOTE — Telephone Encounter (Signed)
    Primary Cardiologist: Lauree Chandler, MD  Chart reviewed as part of pre-operative protocol coverage. Simple dental extractions are considered low risk procedures per guidelines and generally do not require any specific cardiac clearance. It is also generally accepted that for simple extractions and dental cleanings, there is no need to interrupt blood thinner therapy.   SBE prophylaxis is not required for the patient.  I will route this recommendation to the requesting party via Epic fax function and remove from pre-op pool.  Please call with questions.  Deberah Pelton, NP 12/10/2021, 4:17 PM

## 2021-12-10 NOTE — Telephone Encounter (Signed)
   Pre-operative Risk Assessment    Patient Name: [name]  DOB: Oct 10, 1955 MRN: [id number]     Request for Surgical Clearance    Procedure:   DENTAL CLEANING  Date of Surgery:  Clearance TBD                                 Surgeon:  NOT LISTED Surgeon's Group or Practice Name:  [facility]  Phone number:  [phone] Fax number:  [phone]   Type of Clearance Requested:   - Medical  - Pharmacy:  Hold Aspirin and Clopidogrel (Plavix)     Type of Anesthesia:  None    Additional requests/questions:    Jiles Prows   12/10/2021, 3:24 PM

## 2022-07-01 ENCOUNTER — Telehealth: Payer: Self-pay | Admitting: Cardiovascular Disease

## 2022-07-01 DIAGNOSIS — E782 Mixed hyperlipidemia: Secondary | ICD-10-CM

## 2022-07-01 MED ORDER — ROSUVASTATIN CALCIUM 40 MG PO TABS: ORAL_TABLET | ORAL | 0 refills | Status: DC

## 2022-07-01 NOTE — Telephone Encounter (Signed)
*  STAT* If patient is at the pharmacy, call can be transferred to refill team.   1. Which medications need to be refilled? (please list name of each medication and dose if known)   rosuvastatin (CRESTOR) 40 MG tablet   2. Which pharmacy/location (including street and city if local pharmacy) is medication to be sent to?  [facility] [phone] - [place], [place] - [address]   3. Do they need a 30 day or 90 day supply?   90 day  Patient stated he is completely out of this medication.

## 2022-07-01 NOTE — Telephone Encounter (Signed)
Pt calling requesting a refill on rosuvastatin. Pt's PCP has been refilling this medication. Would Dr. Clifton James like to refill this medication? Please address

## 2022-07-01 NOTE — Telephone Encounter (Signed)
Pt's medication was sent to pt's pharmacy as requested. Confirmation received.  °

## 2022-07-28 ENCOUNTER — Telehealth: Payer: Self-pay | Admitting: Cardiovascular Disease

## 2022-07-28 ENCOUNTER — Other Ambulatory Visit: Payer: Self-pay

## 2022-07-28 MED ORDER — METOPROLOL TARTRATE 25 MG PO TABS: 12.5000 mg | ORAL_TABLET | Freq: Two times a day (BID) | ORAL | 0 refills | Status: DC

## 2022-07-28 NOTE — Telephone Encounter (Signed)
Pt's medication was sent to pt's pharmacy as requested. Confirmation received.  °

## 2022-07-28 NOTE — Telephone Encounter (Signed)
Pt would like to speak with the nurse for himself and his wife [name] about making appointments. Please advise.

## 2022-07-28 NOTE — Telephone Encounter (Signed)
Spoke to pt. Pt wanted an appointment in the late afternoon for himself and his wife with Dr. Clifton James. Was told prior by scheduling that could not be accommodated for them. Set appointment for Pt and his wife [name] to see Dr. Clifton James on 10/26/22 at 4 and 4:30 pm. Patient asked questions about normal blood pressure ranges and why sometimes his blood pressure was higher at the doctor than at home while making the appointments. Answered all questions.

## 2022-08-26 ENCOUNTER — Other Ambulatory Visit: Payer: Self-pay

## 2022-08-26 MED ORDER — METOPROLOL TARTRATE 25 MG PO TABS: 12.5000 mg | ORAL_TABLET | Freq: Two times a day (BID) | ORAL | 0 refills | Status: DC

## 2022-09-02 ENCOUNTER — Other Ambulatory Visit: Payer: Self-pay | Admitting: Internal Medicine

## 2022-09-02 DIAGNOSIS — R7303 Prediabetes: Secondary | ICD-10-CM

## 2022-09-02 DIAGNOSIS — E782 Mixed hyperlipidemia: Secondary | ICD-10-CM

## 2022-09-02 DIAGNOSIS — I2510 Atherosclerotic heart disease of native coronary artery without angina pectoris: Secondary | ICD-10-CM

## 2022-09-02 NOTE — Addendum Note (Signed)
Addended by: Derrek Monaco on: 09/02/2022 08:34 AM   Modules accepted: Orders

## 2022-09-15 ENCOUNTER — Other Ambulatory Visit: Payer: Medicare PPO

## 2022-09-15 DIAGNOSIS — I2510 Atherosclerotic heart disease of native coronary artery without angina pectoris: Secondary | ICD-10-CM

## 2022-09-15 DIAGNOSIS — R7303 Prediabetes: Secondary | ICD-10-CM

## 2022-09-15 DIAGNOSIS — E782 Mixed hyperlipidemia: Secondary | ICD-10-CM

## 2022-09-16 ENCOUNTER — Encounter: Payer: Self-pay | Admitting: Cardiovascular Disease

## 2022-09-16 ENCOUNTER — Ambulatory Visit: Payer: Medicare PPO | Attending: Cardiovascular Disease | Admitting: Cardiovascular Disease

## 2022-09-16 VITALS — BP 128/80 | HR 74 | Ht 72.0 in | Wt 152.6 lb

## 2022-09-16 DIAGNOSIS — E782 Mixed hyperlipidemia: Secondary | ICD-10-CM

## 2022-09-16 DIAGNOSIS — I2510 Atherosclerotic heart disease of native coronary artery without angina pectoris: Secondary | ICD-10-CM

## 2022-09-16 LAB — BMP8+ANION GAP
Anion Gap: 19 mmol/L — ABNORMAL HIGH (ref 10.0–18.0)
BUN/Creatinine Ratio: 11 (ref 10–24)
BUN: 11 mg/dL (ref 8–27)
CO2: 22 mmol/L (ref 20–29)
Calcium: 10.1 mg/dL (ref 8.6–10.2)
Chloride: 101 mmol/L (ref 96–106)
Creatinine, Ser: 1.04 mg/dL (ref 0.76–1.27)
Glucose: 96 mg/dL (ref 70–99)
Potassium: 4.6 mmol/L (ref 3.5–5.2)
Sodium: 142 mmol/L (ref 134–144)
eGFR: 79 mL/min/{1.73_m2} (ref >59)

## 2022-09-16 LAB — LIPID PANEL
Chol/HDL Ratio: 3 ratio (ref 0.0–5.0)
Cholesterol, Total: 169 mg/dL (ref 100–199)
HDL: 57 mg/dL (ref >39)
LDL Chol Calc (NIH): 92 mg/dL (ref 0–99)
Triglycerides: 112 mg/dL (ref 0–149)
VLDL Cholesterol Cal: 20 mg/dL (ref 5–40)

## 2022-09-16 LAB — HEMOGLOBIN A1C
Est. average glucose Bld gHb Est-mCnc: 128 mg/dL
Hgb A1c MFr Bld: 6.1 % — ABNORMAL HIGH (ref 4.8–5.6)

## 2022-09-16 NOTE — Patient Instructions (Signed)

## 2022-09-16 NOTE — Progress Notes (Signed)
Chief Complaint  Patient presents with   Follow-up    CAD   History of Present Illness: 67 yo male with history of CAD, OSA, hyperlipidemia here today for cardiac follow up. 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 In December 2009 he had a non-ST elevation MI treated with drug-eluting stents in the circumflex and LAD. He had a follow-up catheterization in February 2010 which showed nonobstructive disease. He was seen in our office May 2017 and he was having fatigue with low BP and chest burning while mowing the grass. Nuclear stress test 05/29/15 with no ischemia. Toprol was stopped and his fatigue resolved. He was seen in our office in July 2020 and had c/o chest pain. Nuclear stress test August 2020 with no ischemia. He had been working in the yard that day in the heat. He was sweaty and began to notice "pulling" over his left chest and left abdomen. He took an ASA and NTG and the pain resolved. He saw primary care on 08/04/21 after having chest pain while working in the yard in extreme heat and troponin was elevated. EKG did not show ischemic changes. He was sent to the ED and his troponin was trending down. EKG with no ischemic changes. He was seen by cardiology and refused admission. He left against medical advice. I saw him in the office in July 2023 and he was feeling well and did not wish to have a cardiac cath.   He is here today for follow up. The patient denies any chest pain, dyspnea, palpitations, lower extremity edema, orthopnea, PND, dizziness, near syncope or syncope.    Primary Care Physician: Mercie Eon, MD  Past Medical History:  Diagnosis Date   Back pain    Coronary artery [place]    [place] [address] s/p DES to RCA. b. NSTEMI 12/2007 s/p DES to Cx and LAD. c. 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 Swallow whole.     benzonatate (TESSALON PERLES) 100 MG capsule Take 1 capsule (100 mg total) by mouth 3 (three) times daily as needed for cough. 30 capsule 0   clopidogrel (PLAVIX) 75 MG tablet Take 1 tablet (75 mg total) by mouth daily. 90 tablet 3   fluticasone (FLONASE SENSIMIST) 27.5 MCG/SPRAY nasal spray Place 2 sprays into the nose daily. 10 g 12   fluticasone (FLONASE) 50 MCG/ACT nasal spray Place 2 sprays into both nostrils daily.     ketoconazole (NIZORAL) 2 % shampoo Apply 1 application topically daily. Apply to affected areas and leave on skin for 5 minutes prior to washing off. 120 mL 0   metoprolol tartrate (LOPRESSOR) 25 MG tablet Take 0.5 tablets (12.5 mg total) by mouth 2 (two) times daily. 90 tablet 0   nitroGLYCERIN (NITROSTAT) 0.4 MG SL tablet Place 1 tablet (0.4 mg total) under the tongue every 5 (five) minutes as needed for chest pain (up to 3 doses in 15 min per episode of chest pain and call 911 if you take 3rd dose in 15 min.). 25 tablet 5   NON  FORMULARY Take 1 tablet by mouth daily. Medication: Ajwadate - Supplement     pantoprazole (PROTONIX) 40 MG tablet Take 1 tablet (40 mg total) by mouth daily. 90 tablet 3   prednisoLONE acetate (PRED FORTE) 1 % ophthalmic suspension Place 1 drop into both eyes daily.     rosuvastatin (CRESTOR) 40 MG tablet TAKE 1 TABLET(40 MG) BY MOUTH DAILY 90 tablet 0   Saw Palmetto 450 MG CAPS Take 450 mg by mouth as needed (as a health supplement).      sodium chloride (OCEAN) 0.65 % SOLN nasal spray Place 1 spray into both nostrils daily. Prior to using flonase 15 mL 0   No current facility-administered medications for this visit.    Allergies  Allergen Reactions   Amoxicillin     REACTION: unspecified REACTION:  unspecified   Citalopram Hydrobromide     Other reaction(s): DROWSINESS Other reaction(s): DROWSINESS   Clarithromycin     Other reaction(s): DIARRHEA Other reaction(s): DIARRHEA   Guaifenesin     Other reaction(s): HEADACHE Other reaction(s): HEADACHE   Penicillins     Other reaction(s): SWELLING Other reaction(s): SWELLING   Streptomycin     Other reaction(s): CHOKES Other reaction(s): CHOKES   Zetia [Ezetimibe] Other (See Comments)    Nausea, muscle aches, sluggish    Social History   Socioeconomic History   Marital status: Married    Spouse name: Not on file   Number of children: Not on file   Years of education: Not on file   Highest education level: Not on file  Occupational History   Not on file  Tobacco Use   Smoking status: Never   Smokeless tobacco: Never  Vaping Use   Vaping status: Never Used  Substance and Sexual Activity   Alcohol use: No    Alcohol/week: 0.0 standard drinks of alcohol   Drug use: No   Sexual activity: Not on file  Other Topics Concern   Not on file  Social History Narrative   Not on file   Social Determinants of Health   Financial Resource Strain: Not on file  Food Insecurity: Not on file  Transportation Needs: Not on file  Physical Activity: Not on file  Stress: Not on file  Social Connections: Not on file  Intimate Partner Violence: Not on file    Family History  Problem Relation Age of Onset   Coronary artery disease Mother    Hypertension Father    Heart attack Paternal Grandfather     Review of Systems:  As stated in the HPI and otherwise negative.   BP 128/80   Pulse 74   Ht 6' (1.829 m)   Wt 69.2 kg   SpO2 98%   BMI 20.70 kg/m   Physical Examination: General: Well developed, well nourished, NAD  HEENT: OP clear, mucus membranes moist  SKIN: warm, dry. No rashes. Neuro: No focal deficits  Musculoskeletal: Muscle strength 5/5 all ext  Psychiatric: Mood and affect normal  Neck: No JVD, no carotid  bruits, no thyromegaly, no lymphadenopathy.  Lungs:Clear bilaterally, no wheezes, rhonci, crackles Cardiovascular: Regular rate and rhythm. No murmurs, gallops or rubs. Abdomen:Soft. Bowel sounds present. Non-tender.  Extremities: No lower extremity edema. Pulses are 2 + in the bilateral DP/PT.  EKG:  EKG is ordered today. The ekg ordered today demonstrates  EKG Interpretation Date/Time:  Wednesday September 16 2022 16:08:45 EDT Ventricular Rate:  74 PR Interval:  152 QRS Duration:  88 QT Interval:  398 QTC Calculation: 441 R  Axis:   94  Text Interpretation: Normal sinus rhythm Confirmed by Verne Carrow [phone]) on 09/16/2022 4:09:19 PM    Recent Labs: 09/15/2022: BUN 11; Creatinine, Ser 1.04; Potassium 4.6; Sodium 142   Lipid Panel    Component Value Date/Time   CHOL 169 09/15/2022 1456   TRIG 112 09/15/2022 1456   HDL 57 09/15/2022 1456   CHOLHDL 3.0 09/15/2022 1456   CHOLHDL 4 04/18/2014 1613   VLDL 18.8 04/18/2014 1613   LDLCALC 92 09/15/2022 1456   LDLDIRECT 146.2 11/20/2009 0907   LABVLDL 20 09/15/2022 1456     Wt Readings from Last 3 Encounters:  09/16/22 69.2 kg  08/27/21 70.2 kg  08/07/21 69.9 kg    Assessment and Plan:   1. CAD without angina: No chest pain suggestive of angina. Will continue ASA, Plavix, statin and beta blocker. He will call with any change in his symptoms.     2. Hyperlipidemia:  LDL near goal 08/15/22. He has not tolerate Zetia in the past. He does not wish to consider Repatha injections. Continue Crestor.   Labs/ tests ordered today include:   Orders Placed This Encounter  Procedures   EKG 12-Lead   Disposition:   F/U with me in 6  months  Signed, Verne Carrow, MD 09/16/2022 7:57 PM    [facility] [address] [place], [place], [place]  [address] Phone: [phone]; Fax: [phone]

## 2022-09-28 ENCOUNTER — Other Ambulatory Visit: Payer: Self-pay | Admitting: Cardiovascular Disease

## 2022-09-28 DIAGNOSIS — E782 Mixed hyperlipidemia: Secondary | ICD-10-CM

## 2022-10-06 NOTE — Progress Notes (Signed)
[facility]: Clinic Note  Subjective:  History of Present Illness: [name] is a 67 y.o. year old male who presents for routine follow up. I saw him last year to establish care with me.  He is doing well overall. He has 2 new symptoms that both started yesterday.  Since yesterday, he feels that he has something stuck in his eye. No vision changes and no eye pain. He had cataract surgery within the past year, and has had occasional floaters since surgery, but no recent change in this. He does not wear contacts.  Since yesterday, he has had pain in his left lateral flank. It's worse with twisting and bending motion. Tylenol didn't help much. No urinary symptoms.    Please refer to Assessment and Plan below for full details in Problem-Based Charting.   Past Medical History:  Patient Active Problem List   Diagnosis Date Noted   Foreign body sensation, left eye 10/07/2022   Acute left-sided low back pain without sciatica 10/07/2022   Healthcare maintenance 11/09/2018   Prediabetes 05/14/2016   Primary insomnia 08/16/2014   Benign neoplasm of colon 03/30/2013   Diaphragmatic hernia 03/30/2013   Esophageal reflux 03/30/2013   Mixed hyperlipidemia 11/20/2009   Coronary artery disease 12/27/2007      Medications:  Current Outpatient Medications:    methocarbamol (ROBAXIN) 500 MG tablet, Take 1 tablet (500 mg total) by mouth every 8 (eight) hours as needed for up to 14 days for muscle spasms., Disp: 42 tablet, Rfl: 0   aspirin EC 81 MG tablet, Take 81 mg by mouth daily. Swallow whole., Disp: , Rfl:    benzonatate (TESSALON PERLES) 100 MG capsule, Take 1 capsule (100 mg total) by mouth 3 (three) times daily as needed for cough., Disp: 30 capsule, Rfl: 0   clopidogrel (PLAVIX) 75 MG tablet, Take 1 tablet (75 mg total) by mouth daily., Disp: 90 tablet, Rfl: 3   fluticasone (FLONASE SENSIMIST) 27.5 MCG/SPRAY nasal spray, Place 2 sprays into the nose daily., Disp:  10 g, Rfl: 12   fluticasone (FLONASE) 50 MCG/ACT nasal spray, Place 2 sprays into both nostrils daily., Disp: , Rfl:    ketoconazole (NIZORAL) 2 % shampoo, Apply 1 application topically daily. Apply to affected areas and leave on skin for 5 minutes prior to washing off., Disp: 120 mL, Rfl: 0   metoprolol tartrate (LOPRESSOR) 25 MG tablet, Take 0.5 tablets (12.5 mg total) by mouth 2 (two) times daily., Disp: 90 tablet, Rfl: 0   nitroGLYCERIN (NITROSTAT) 0.4 MG SL tablet, Place 1 tablet (0.4 mg total) under the tongue every 5 (five) minutes as needed for chest pain (up to 3 doses in 15 min per episode of chest pain and call 911 if you take 3rd dose in 15 min.)., Disp: 25 tablet, Rfl: 5   NON FORMULARY, Take 1 tablet by mouth daily. Medication: Ajwadate - Supplement, Disp: , Rfl:    pantoprazole (PROTONIX) 40 MG tablet, Take 1 tablet (40 mg total) by mouth daily., Disp: 90 tablet, Rfl: 3   prednisoLONE acetate (PRED FORTE) 1 % ophthalmic suspension, Place 1 drop into both eyes daily., Disp: , Rfl:    rosuvastatin (CRESTOR) 40 MG tablet, TAKE 1 TABLET(40 MG) BY MOUTH DAILY, Disp: 90 tablet, Rfl: 3   Saw Palmetto 450 MG CAPS, Take 450 mg by mouth as needed (as a health supplement). , Disp: , Rfl:    sodium chloride (OCEAN) 0.65 % SOLN nasal spray, Place 1 spray into both  nostrils daily. Prior to using flonase, Disp: 15 mL, Rfl: 0   Allergies: Allergies  Allergen Reactions   Amoxicillin     REACTION: unspecified REACTION: unspecified   Citalopram Hydrobromide     Other reaction(s): DROWSINESS Other reaction(s): DROWSINESS   Clarithromycin     Other reaction(s): DIARRHEA Other reaction(s): DIARRHEA   Guaifenesin     Other reaction(s): HEADACHE Other reaction(s): HEADACHE   Penicillins     Other reaction(s): SWELLING Other reaction(s): SWELLING   Streptomycin     Other reaction(s): CHOKES Other reaction(s): CHOKES   Zetia [Ezetimibe] Other (See Comments)    Nausea, muscle aches, sluggish        Objective:   Vitals: Vitals:   10/07/22 1012  BP: 113/80  Pulse: 80  Temp: 98 F (36.7 C)  SpO2: 100%     Physical Exam: Physical Exam Constitutional:      Appearance: Normal appearance.  HENT:     Head: Normocephalic and atraumatic.     Comments: L eye with visible tearduct inferiorly, not inflamed. No foreign bodies noted. Musculoskeletal:     Comments: +mild ttp over L paraspinal muscles. No CVAT  Neurological:     Mental Status: He is alert.  Psychiatric:        Mood and Affect: Mood normal.        Behavior: Behavior normal.      Data: Labs, imaging, and micro were reviewed in Epic. Refer to Assessment and Plan below for full details in Problem-Based Charting.  Assessment & Plan:  Mixed hyperlipidemia - continue Rosuvastatin 40mg  daily - LDL 92, slightly above goal of <70 with CAD. We discussed starting PCSK9, patient again politely declined  Coronary artery disease - chronic and stable, with no current CP. He follows with Cardiology. - continue aspirin 81mg  daily, plavix 75mg  daily, rosuvastatin 40mg  daily, and metoprolol   Healthcare maintenance - flu shot, pneumonia shot today - stool cards given to patient today  Esophageal reflux - continue pantoprazole 40mg  daily - at next visit, attempt to wean to 20mg  daily, then off  Prediabetes - A1c 6.1, stable from prior - annual A1C. Lifestyle changes  Foreign body sensation, left eye - exam was normal today. If any vision changes or eye pain, patient will contact his eye doctor   Acute left-sided low back pain without sciatica - I think this is musculoskeletal. Continue heat, stretching, tylenol. Robaxin if not improved in 1 week     Patient will follow up in 6 months  Mercie Eon, MD

## 2022-10-07 ENCOUNTER — Ambulatory Visit: Payer: Medicare PPO | Admitting: Internal Medicine

## 2022-10-07 ENCOUNTER — Encounter: Payer: Self-pay | Admitting: Internal Medicine

## 2022-10-07 VITALS — BP 113/80 | HR 80 | Temp 98.0°F | Ht 72.0 in | Wt 156.7 lb

## 2022-10-07 DIAGNOSIS — R7303 Prediabetes: Secondary | ICD-10-CM | POA: Diagnosis not present

## 2022-10-07 DIAGNOSIS — I2510 Atherosclerotic heart disease of native coronary artery without angina pectoris: Secondary | ICD-10-CM

## 2022-10-07 DIAGNOSIS — H578A2 Foreign body sensation, left eye: Secondary | ICD-10-CM

## 2022-10-07 DIAGNOSIS — E782 Mixed hyperlipidemia: Secondary | ICD-10-CM

## 2022-10-07 DIAGNOSIS — Z0000 Encounter for general adult medical examination without abnormal findings: Secondary | ICD-10-CM

## 2022-10-07 DIAGNOSIS — M545 Low back pain, unspecified: Secondary | ICD-10-CM

## 2022-10-07 DIAGNOSIS — K219 Gastro-esophageal reflux disease without esophagitis: Secondary | ICD-10-CM

## 2022-10-07 DIAGNOSIS — D126 Benign neoplasm of colon, unspecified: Secondary | ICD-10-CM

## 2022-10-07 DIAGNOSIS — Z23 Encounter for immunization: Secondary | ICD-10-CM

## 2022-10-07 MED ORDER — METHOCARBAMOL 500 MG PO TABS: 500.0000 mg | ORAL_TABLET | Freq: Three times a day (TID) | ORAL | 0 refills | Status: AC | PRN

## 2022-10-07 MED ORDER — CLOPIDOGREL BISULFATE 75 MG PO TABS: 75.0000 mg | ORAL_TABLET | Freq: Every day | ORAL | 3 refills | Status: DC

## 2022-10-07 MED ORDER — PANTOPRAZOLE SODIUM 40 MG PO TBEC
40.0000 mg | DELAYED_RELEASE_TABLET | Freq: Every day | ORAL | 3 refills | Status: AC
Start: 2022-10-07 — End: ?

## 2022-10-07 NOTE — Assessment & Plan Note (Signed)
-   A1c 6.1, stable from prior - annual A1C. Lifestyle changes

## 2022-10-07 NOTE — Assessment & Plan Note (Signed)
-   exam was normal today. If any vision changes or eye pain, patient will contact his eye doctor

## 2022-10-07 NOTE — Patient Instructions (Signed)
Dear [name],  It was a pleasure seeing you in clinic today.  For your back pain, I think this is a muscle strain. I encourage you to take tylenol, use heating packs, and stretch the back. If the pain does not go away by the end of this week, you may also take a muscle relaxant medicine called Robaxin. I have sent this to your pharmacy. Start taking it at night, as it might make you a little sleepy.  For your eye, I did not see any foreign bodies in your eye today. You can use saline eye drops if your eyes feel dry. If you have any vision changes or eye pain, please call your eye doctor right away.  I'll see you back in 6 months, please call if you need anything sooner.  Sincerely, Dr. Mercie Eon

## 2022-10-07 NOTE — Assessment & Plan Note (Signed)
-   chronic and stable, with no current CP. He follows with Cardiology. - continue aspirin 81mg  daily, plavix 75mg  daily, rosuvastatin 40mg  daily, and metoprolol

## 2022-10-07 NOTE — Assessment & Plan Note (Signed)
-   flu shot, pneumonia shot today - stool cards given to patient today

## 2022-10-07 NOTE — Assessment & Plan Note (Signed)
-   continue Rosuvastatin 40mg  daily - LDL 92, slightly above goal of <70 with CAD. We discussed starting PCSK9, patient again politely declined

## 2022-10-07 NOTE — Assessment & Plan Note (Signed)
-   continue pantoprazole 40mg  daily - at next visit, attempt to wean to 20mg  daily, then off

## 2022-10-07 NOTE — Assessment & Plan Note (Signed)
-   I think this is musculoskeletal. Continue heat, stretching, tylenol. Robaxin if not improved in 1 week

## 2022-10-22 ENCOUNTER — Other Ambulatory Visit: Payer: Medicare PPO

## 2022-10-22 DIAGNOSIS — D126 Benign neoplasm of colon, unspecified: Secondary | ICD-10-CM

## 2022-10-23 LAB — FECAL OCCULT BLOOD, IMMUNOCHEMICAL: Fecal Occult Bld: NEGATIVE

## 2022-10-26 ENCOUNTER — Ambulatory Visit: Payer: BC Managed Care – PPO | Admitting: Cardiovascular Disease

## 2022-11-26 ENCOUNTER — Other Ambulatory Visit: Payer: Self-pay | Admitting: Cardiovascular Disease

## 2022-12-29 ENCOUNTER — Telehealth: Payer: Self-pay | Admitting: Internal Medicine

## 2022-12-29 DIAGNOSIS — E782 Mixed hyperlipidemia: Secondary | ICD-10-CM

## 2022-12-29 MED ORDER — ROSUVASTATIN CALCIUM 40 MG PO TABS
ORAL_TABLET | ORAL | 3 refills | Status: DC
Start: 2022-12-29 — End: 2023-09-30

## 2022-12-29 NOTE — Telephone Encounter (Signed)
  rosuvastatin (CRESTOR) 40 MG tablet   [id number] - [place], [place] - [address]

## 2023-01-20 ENCOUNTER — Encounter: Payer: Self-pay | Admitting: Internal Medicine

## 2023-01-20 ENCOUNTER — Ambulatory Visit: Payer: Medicare Other | Admitting: Internal Medicine

## 2023-01-20 NOTE — Progress Notes (Signed)
 No charge for this visit. 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 Follow up in March.

## 2023-01-21 NOTE — Progress Notes (Signed)
 Agree with no charge.

## 2023-02-22 ENCOUNTER — Encounter: Payer: Medicare Other | Admitting: Student

## 2023-04-20 ENCOUNTER — Other Ambulatory Visit: Payer: Self-pay | Admitting: Internal Medicine

## 2023-04-20 MED ORDER — CLOPIDOGREL BISULFATE 75 MG PO TABS: 75.0000 mg | ORAL_TABLET | Freq: Every day | ORAL | 3 refills | Status: DC

## 2023-04-20 NOTE — Telephone Encounter (Signed)
 Copied from CRM [id number]. Topic: Clinical - Medication Refill >> Apr 20, 2023  8:49 AM Prudencio Pair wrote: Most Recent Primary Care Visit:  Provider: Champ Mungo  Department: [facility]  Visit Type: OPEN ESTABLISHED  Date: 01/20/2023  Medication: clopidogrel (PLAVIX) 75 MG tablet  Has the patient contacted their pharmacy? No (Agent: If no, request that the patient contact the pharmacy for the refill. If patient does not wish to contact the pharmacy document the reason why and proceed with request.) (Agent: If yes, when and what did the pharmacy advise?)  Is this the correct pharmacy for this prescription? Yes If no, delete pharmacy and type the correct one.  This is the patient's preferred pharmacy:  [facility] [phone] - [place], [place] - [address] AT [facility] [address] [place] [address] Phone: [phone] Fax: [phone]   Has the prescription been filled recently? Yes  Is the patient out of the medication? Yes  Has the patient been seen for an appointment in the last year OR does the patient have an upcoming appointment? Yes  Can we respond through MyChart? No  Agent: Please be advised that Rx refills may take up to 3 business days. We ask that you follow-up with your pharmacy.

## 2023-05-25 NOTE — Progress Notes (Unsigned)
 [facility]: Clinic Note  Subjective:  History of Present Illness: [name] is a 68 y.o. year old male who presents for 6 month routine follow up of his chronic medical conditions.  Mixed hyperlipidemia - continue Rosuvastatin  40mg  daily - LDL 92, slightly above goal of <70 with CAD. We discussed starting PCSK9, patient again politely declined   Coronary artery disease - chronic and stable, with no current CP. He follows with Cardiology. - continue aspirin  81mg  daily, plavix  75mg  daily, rosuvastatin  40mg  daily, and metoprolol     Healthcare maintenance - flu shot, pneumonia shot today - stool cards given to patient today   Esophageal reflux - continue pantoprazole  40mg  daily - at next visit, attempt to wean to 20mg  daily, then off   Prediabetes - A1c 6.1, stable from prior - annual A1C. Lifestyle changes   Foreign body sensation, left eye - exam was normal today. If any vision changes or eye pain, patient will contact his eye doctor    Acute left-sided low back pain without sciatica - I think this is musculoskeletal. Continue heat, stretching, tylenol . Robaxin  if not improved in 1 week   Please refer to Assessment and Plan below for full details in Problem-Based Charting.   Past Medical History:  Patient Active Problem List   Diagnosis Date Noted   Erroneous encounter - disregard 01/20/2023   Foreign body sensation, left eye 10/07/2022   Acute left-sided low back pain without sciatica 10/07/2022   Healthcare maintenance 11/09/2018   Prediabetes 05/14/2016   Primary insomnia 08/16/2014   Benign neoplasm of colon 03/30/2013   Diaphragmatic hernia 03/30/2013   Esophageal reflux 03/30/2013   Mixed hyperlipidemia 11/20/2009   Coronary artery disease 12/27/2007      Medications:  Current Outpatient Medications:    aspirin  EC 81 MG tablet, Take 81 mg by mouth daily. Swallow whole., Disp: , Rfl:    clopidogrel  (PLAVIX ) 75 MG tablet, Take 1  tablet (75 mg total) by mouth daily., Disp: 90 tablet, Rfl: 3   fluticasone (FLONASE  SENSIMIST) 27.5 MCG/SPRAY nasal spray, Place 2 sprays into the nose daily., Disp: 10 g, Rfl: 12   fluticasone (FLONASE ) 50 MCG/ACT nasal spray, Place 2 sprays into both nostrils daily., Disp: , Rfl:    ketoconazole  (NIZORAL ) 2 % shampoo, Apply 1 application topically daily. Apply to affected areas and leave on skin for 5 minutes prior to washing off., Disp: 120 mL, Rfl: 0   metoprolol  tartrate (LOPRESSOR ) 25 MG tablet, TAKE 1/2 TABLET(12.5 MG) BY MOUTH TWICE DAILY, Disp: 90 tablet, Rfl: 2   nitroGLYCERIN  (NITROSTAT ) 0.4 MG SL tablet, Place 1 tablet (0.4 mg total) under the tongue every 5 (five) minutes as needed for chest pain (up to 3 doses in 15 min per episode of chest pain and call 911 if you take 3rd dose in 15 min.)., Disp: 25 tablet, Rfl: 5   NON FORMULARY, Take 1 tablet by mouth daily. Medication: Ajwadate - Supplement, Disp: , Rfl:    pantoprazole  (PROTONIX ) 40 MG tablet, Take 1 tablet (40 mg total) by mouth daily., Disp: 90 tablet, Rfl: 3   prednisoLONE acetate (PRED FORTE) 1 % ophthalmic suspension, Place 1 drop into both eyes daily., Disp: , Rfl:    rosuvastatin  (CRESTOR ) 40 MG tablet, TAKE 1 TABLET(40 MG) BY MOUTH DAILY, Disp: 90 tablet, Rfl: 3   Saw Palmetto 450 MG CAPS, Take 450 mg by mouth as needed (as a health supplement). , Disp: , Rfl:    sodium chloride (OCEAN)  0.65 % SOLN nasal spray, Place 1 spray into both nostrils daily. Prior to using flonase , Disp: 15 mL, Rfl: 0   Allergies: Allergies  Allergen Reactions   Amoxicillin     REACTION: unspecified REACTION: unspecified   Citalopram Hydrobromide     Other reaction(s): DROWSINESS Other reaction(s): DROWSINESS   Clarithromycin     Other reaction(s): DIARRHEA Other reaction(s): DIARRHEA   Guaifenesin     Other reaction(s): HEADACHE Other reaction(s): HEADACHE   Penicillins     Other reaction(s): SWELLING Other reaction(s): SWELLING    Streptomycin     Other reaction(s): CHOKES Other reaction(s): CHOKES   Zetia  [Ezetimibe ] Other (See Comments)    Nausea, muscle aches, sluggish       Objective:   Vitals: There were no vitals filed for this visit.   Physical Exam: Physical Exam   Data: Labs, imaging, and micro were reviewed in Epic. Refer to Assessment and Plan below for full details in Problem-Based Charting.  Assessment & Plan:  No problem-specific Assessment & Plan notes found for this encounter.     Patient will follow up in ***  Driscilla George, MD

## 2023-05-26 ENCOUNTER — Ambulatory Visit (INDEPENDENT_AMBULATORY_CARE_PROVIDER_SITE_OTHER): Admitting: Internal Medicine

## 2023-05-26 ENCOUNTER — Encounter: Payer: Self-pay | Admitting: Internal Medicine

## 2023-05-26 VITALS — BP 108/74 | HR 70 | Temp 97.6°F | Ht 72.0 in | Wt 148.6 lb

## 2023-05-26 DIAGNOSIS — G629 Polyneuropathy, unspecified: Secondary | ICD-10-CM | POA: Diagnosis not present

## 2023-05-26 DIAGNOSIS — E782 Mixed hyperlipidemia: Secondary | ICD-10-CM | POA: Diagnosis not present

## 2023-05-26 DIAGNOSIS — K219 Gastro-esophageal reflux disease without esophagitis: Secondary | ICD-10-CM | POA: Diagnosis not present

## 2023-05-26 DIAGNOSIS — M545 Low back pain, unspecified: Secondary | ICD-10-CM

## 2023-05-26 DIAGNOSIS — I2510 Atherosclerotic heart disease of native coronary artery without angina pectoris: Secondary | ICD-10-CM | POA: Diagnosis not present

## 2023-05-26 DIAGNOSIS — R7303 Prediabetes: Secondary | ICD-10-CM | POA: Diagnosis not present

## 2023-05-26 MED ORDER — CLOPIDOGREL BISULFATE 75 MG PO TABS: 75.0000 mg | ORAL_TABLET | Freq: Every day | ORAL | 3 refills | Status: DC

## 2023-05-26 NOTE — Assessment & Plan Note (Signed)
-   chronic and stable - he will try pantoprazole  20mg  daily instead of 40mg  to see what is the lowest dose that controls his symptoms

## 2023-05-26 NOTE — Assessment & Plan Note (Signed)
-   chronic and stable, not on medicines - A1C today

## 2023-05-26 NOTE — Assessment & Plan Note (Signed)
-   chronic and stable - He follows with Cardiology - Continue aspirin  81mg  daily, plavix  75mg  daily, rosuvastatin  40mg  daily, and metoprolol

## 2023-05-26 NOTE — Assessment & Plan Note (Signed)
-   I think this is a musculoskeletal pain, with no red flags on exam. He has not tried topical treatments yet, so will start with OTC lidocaine patches and voltaren gel - If not improved, he will let me know, and I'll plan to refer to PT and possibly add Robaxin

## 2023-05-26 NOTE — Assessment & Plan Note (Signed)
-   chronic and stable - Continue Rosuvastatin  40mg  daily  - lipid panel today, goal for him is <70

## 2023-05-26 NOTE — Patient Instructions (Addendum)
 Thank you, [name] for allowing us  to provide your care today. Today we discussed your low back pain and your chronic health conditions.    I have ordered the following labs for you:   Lab Orders         Lipid Profile         Hemoglobin A1c         Vitamin B12       Referrals ordered today:   Referral Orders  No referral(s) requested today     I have ordered the following medication/changed the following medications:   Stop the following medications: Medications Discontinued During This Encounter  Medication Reason   clopidogrel  (PLAVIX ) 75 MG tablet Reorder     Start the following medications: Meds ordered this encounter  Medications   clopidogrel  (PLAVIX ) 75 MG tablet    Sig: Take 1 tablet (75 mg total) by mouth daily.    Dispense:  90 tablet    Refill:  3     Return in about 6 months (around 11/26/2023).    Remember:  - I encourage you to get your shingles vaccine and your tetanus vaccine at a local pharmacy. When we move our clinic, we will be right next to the [facility] pharmacy.  - For your back pain, I encourage you to use over-the-counter topical treatments, like a Lidocaine patch or Voltaren gel. If these are not working, please let us  know, and we can send you to Physical therapy or try a muscle relaxant - for your acid reflux, try cutting back to 20mg  (half a tablet) of the pantoprazole  to see if this controls your symptoms. I want you to be on the lowest dose that works for you    Should you have any questions or concerns please call the internal medicine clinic at [phone].     [name] Sappington, MD Faculty, Internal Medicine Teaching Progam [facility]

## 2023-05-27 ENCOUNTER — Ambulatory Visit: Payer: Self-pay | Admitting: Internal Medicine

## 2023-05-27 LAB — VITAMIN B12: Vitamin B-12: 478 pg/mL (ref 232–1245)

## 2023-05-27 LAB — LIPID PANEL
Chol/HDL Ratio: 2.9 ratio (ref 0.0–5.0)
Cholesterol, Total: 182 mg/dL (ref 100–199)
HDL: 62 mg/dL (ref >39)
LDL Chol Calc (NIH): 102 mg/dL — ABNORMAL HIGH (ref 0–99)
Triglycerides: 102 mg/dL (ref 0–149)
VLDL Cholesterol Cal: 18 mg/dL (ref 5–40)

## 2023-05-27 LAB — HEMOGLOBIN A1C
Est. average glucose Bld gHb Est-mCnc: 126 mg/dL
Hgb A1c MFr Bld: 6 % — ABNORMAL HIGH (ref 4.8–5.6)

## 2023-07-28 ENCOUNTER — Encounter: Payer: Self-pay | Admitting: *Deleted

## 2023-08-27 ENCOUNTER — Other Ambulatory Visit: Payer: Self-pay | Admitting: Cardiovascular Disease

## 2023-08-27 MED ORDER — METOPROLOL TARTRATE 25 MG PO TABS
12.5000 mg | ORAL_TABLET | Freq: Two times a day (BID) | ORAL | 0 refills | Status: DC
Start: 2023-08-27 — End: 2023-09-30

## 2023-09-21 DIAGNOSIS — H01003 Unspecified blepharitis right eye, unspecified eyelid: Secondary | ICD-10-CM | POA: Diagnosis not present

## 2023-09-21 DIAGNOSIS — Z961 Presence of intraocular lens: Secondary | ICD-10-CM | POA: Diagnosis not present

## 2023-09-21 DIAGNOSIS — H26492 Other secondary cataract, left eye: Secondary | ICD-10-CM | POA: Diagnosis not present

## 2023-09-21 DIAGNOSIS — H01006 Unspecified blepharitis left eye, unspecified eyelid: Secondary | ICD-10-CM | POA: Diagnosis not present

## 2023-09-29 ENCOUNTER — Other Ambulatory Visit: Payer: Self-pay | Admitting: Cardiovascular Disease

## 2023-09-29 DIAGNOSIS — E782 Mixed hyperlipidemia: Secondary | ICD-10-CM

## 2023-09-30 ENCOUNTER — Other Ambulatory Visit: Payer: Self-pay | Admitting: Cardiovascular Disease

## 2023-09-30 DIAGNOSIS — E782 Mixed hyperlipidemia: Secondary | ICD-10-CM

## 2023-10-30 ENCOUNTER — Other Ambulatory Visit: Payer: Self-pay | Admitting: Cardiovascular Disease

## 2023-11-01 ENCOUNTER — Telehealth: Payer: Self-pay | Admitting: Cardiovascular Disease

## 2023-11-01 MED ORDER — METOPROLOL TARTRATE 25 MG PO TABS: 12.5000 mg | ORAL_TABLET | Freq: Two times a day (BID) | ORAL | 0 refills | Status: DC

## 2023-11-01 NOTE — Telephone Encounter (Signed)
*  STAT* If patient is at the pharmacy, call can be transferred to refill team.   1. Which medications need to be refilled? (please list name of each medication and dose if known) metoprolol  tartrate (LOPRESSOR ) 25 MG tablet    4. Which pharmacy/location (including street and city if local pharmacy) is medication to be sent to?  [facility] [phone] - [place], [place] - [address] AT [address] Phone: [phone]  Fax: [phone]        5. Do they need a 30 day or 90 day supply? 90    Sch 1/30

## 2023-11-01 NOTE — Telephone Encounter (Signed)
 Pt's medication was sent to pt's pharmacy as requested. Confirmation received.

## 2023-11-03 ENCOUNTER — Ambulatory Visit (INDEPENDENT_AMBULATORY_CARE_PROVIDER_SITE_OTHER): Admitting: *Deleted

## 2023-11-03 DIAGNOSIS — Z23 Encounter for immunization: Secondary | ICD-10-CM

## 2023-11-16 NOTE — Progress Notes (Unsigned)
   Established Patient Office Visit  Subjective   Patient ID: [name], male    DOB: 18-Jun-1955  Age: 68 y.o. MRN: [id number]  No chief complaint on file.   [name] is a 68 year old male with a past medical history of CAD, hyperlipidemia, prediabetes, and insomnia who presents today for routine follow up. Please see problem-based assessment and plan below for details.   LOV 5/14: seen w Dr. Lovie  ROS    Objective:    There were no vitals taken for this visit.   Physical Exam   No results found for any visits on 11/17/23.    The ASCVD Risk score (Arnett DK, et al., 2019) failed to calculate for the following reasons:   Risk score cannot be calculated because patient has a medical history suggesting prior/existing ASCVD    Assessment & Plan:   Patient seen with {IMTSattending2025/2026:32924}.   Problem List Items Addressed This Visit       Cardiovascular and Mediastinum   Coronary artery disease (Chronic)     Other   Mixed hyperlipidemia - Primary (Chronic)   Patient currently taking Rosuvastatin  40mg  daily. Last lipid panel 5/14 shows total cholesterol 182, LDL 102, current goal is <70 given history of CAD and previous MI. Per review of chart, he does not want to begin PCSK9i injections and has not tolerated Zetia  previously. He has made conscious efforts to lower his cholesterol through diet and exercise.   -Continue Rosuvastatin  40mg  daily -Recheck lipid panel today      Prediabetes (Chronic)   Last A1c 5/14 6.0%. Stable without any medications. Patient reports feeling well and making continued conscious changes to his diet and exercise in efforts to bring down his cholesterol level.   -Will recheck A1c today.       No follow-ups on file.    Lilliah Priego, DO Internal Medicine Resident, PGY-1 8:22 PM 11/16/2023

## 2023-11-16 NOTE — Assessment & Plan Note (Signed)
 Patient currently taking Rosuvastatin  40mg  daily. Last lipid panel 5/14 shows total cholesterol 182, LDL 102, current goal is <70 given history of CAD and previous MI. Per review of chart, he does not want to begin PCSK9i injections and has not tolerated Zetia  previously. He has made conscious efforts to lower his cholesterol through diet and exercise.   -Continue Rosuvastatin  40mg  daily -Recheck lipid panel today

## 2023-11-16 NOTE — Assessment & Plan Note (Signed)
 Last A1c 5/14 6.0%. Today, A1c is 5.8%. Stable without any medications. Patient reports feeling well and making continued conscious changes to his diet and exercise in efforts to bring down his cholesterol level.   -Recheck A1c in 6 months.

## 2023-11-16 NOTE — Patient Instructions (Incomplete)
 Thank you, [name] for allowing us  to provide your care today. Today we discussed your prediabetes, cholesterol, .    Referrals: -Amb referral for general surgery to remove your boil   I have ordered the following labs for you:  Lab Orders         Lipid panel         CBC no Diff         Basic metabolic panel with GFR         POCT HgB A1c and Glucose       I will call if any are abnormal. All of your labs can be accessed through My Chart.   My Chart Access: [email or web address]  Please follow-up in: 6 months for routine follow up    We look forward to seeing you next time. Please call our clinic at [phone] if you have any questions or concerns. The best time to call is Monday-Friday from 9am-4pm, but there is someone available 24/7. If after hours or the weekend, call the main hospital number and ask for the Internal Medicine Resident On-Call. If you need medication refills, please notify your pharmacy one week in advance and they will send us  a request.   Thank you for letting us  take part in your care. Wishing you the best!  Aydian Dimmick, DO 11/17/2023, 9:12 AM [facility]

## 2023-11-17 ENCOUNTER — Ambulatory Visit

## 2023-11-17 VITALS — BP 123/81 | HR 91 | Temp 98.1°F | Ht 72.0 in | Wt 146.2 lb

## 2023-11-17 DIAGNOSIS — R7303 Prediabetes: Secondary | ICD-10-CM

## 2023-11-17 DIAGNOSIS — I2510 Atherosclerotic heart disease of native coronary artery without angina pectoris: Secondary | ICD-10-CM | POA: Diagnosis not present

## 2023-11-17 DIAGNOSIS — Z8249 Family history of ischemic heart disease and other diseases of the circulatory system: Secondary | ICD-10-CM

## 2023-11-17 DIAGNOSIS — L0231 Cutaneous abscess of buttock: Secondary | ICD-10-CM | POA: Diagnosis not present

## 2023-11-17 DIAGNOSIS — E782 Mixed hyperlipidemia: Secondary | ICD-10-CM

## 2023-11-17 DIAGNOSIS — I252 Old myocardial infarction: Secondary | ICD-10-CM

## 2023-11-17 DIAGNOSIS — Z0000 Encounter for general adult medical examination without abnormal findings: Secondary | ICD-10-CM

## 2023-11-17 DIAGNOSIS — Z79899 Other long term (current) drug therapy: Secondary | ICD-10-CM

## 2023-11-17 DIAGNOSIS — Z955 Presence of coronary angioplasty implant and graft: Secondary | ICD-10-CM

## 2023-11-17 LAB — POCT GLYCOSYLATED HEMOGLOBIN (HGB A1C): HbA1c, POC (prediabetic range): 5.8 % (ref 5.7–6.4)

## 2023-11-17 LAB — GLUCOSE, CAPILLARY: Glucose-Capillary: 101 mg/dL — ABNORMAL HIGH (ref 70–99)

## 2023-11-17 NOTE — Progress Notes (Signed)
Internal Medicine Clinic Attending  I was physically present during the key portions of the resident provided service and participated in the medical decision making of patient's management care. I reviewed pertinent patient test results.  The assessment, diagnosis, and plan were formulated together and I agree with the documentation in the resident's note.  Williams, Julie Anne, MD  

## 2023-11-17 NOTE — Assessment & Plan Note (Addendum)
 Patient had his flu shot with our clinic last week, and feels well. He would like to check his FOBT, as he is due for his annual colon cancer screening. Provided stool card today.  -FOBT today

## 2023-11-17 NOTE — Assessment & Plan Note (Addendum)
 Patient reports he has had this recurrent abscess in his left buttock start to arise over the past 4-5 days. He had previously seen dermatology for the same concern about 3-4 years ago, where they had performed an incision and drainage at that time. Per the patient, this abscess may have components of an epidermoid cyst to it, as the dermatologist recommended a small surgical procedure to remove the cystic sac to prevent recurrence. Per patient preference, he declined at that time and opted solely for I&D. With this recurrence, he would like to be seen by a general surgeon for removal, as it is large, painful to the touch, and is preventing him from sitting for prolonged periods of time. Discussed temporary relief measures with the patient, such as warm compresses to help bring the abscess to a point to naturally drain. If it becomes too uncomfortable, recommended that he can be seen by an urgent care for removal as well. Patient voiced understanding, but still would like to be referred. Will send today, but instructed patient to attempt these measures first.  -Amb referral to general surgery

## 2023-11-17 NOTE — Assessment & Plan Note (Addendum)
 Patient's current regimen is ASA 81mg , Plavix  75mg , Metoprolol  12.5mg  BID, and Rosuvastatin  40mg  daily. He reports he feels well overall and follows with Dr. Verlin as his cardiologist. The patient prefers to be on DAPT therapy, and is due to follow up with his cardiologist within the next few months. He also has started Maca root supplementation. After reviewing, no noted hepatoxicity with this supplement that would interact with his current medications. Discussed with patient how Maca has vitamin K in it, which would interfere with blood thinners like Warfarin. Instructed him to watch for any signs of bleeding or changes in how he feels. He would like to check his routine bloodwork today, and given that he is on DAPT, I think it is reasonable to check CBC today.   - CBC - Continue Metoprolol  12.5mg  BID,  Plavix  75mg , and ASA 81mg  - Instructed patient to follow up with cardiology

## 2023-11-18 ENCOUNTER — Other Ambulatory Visit

## 2023-11-18 DIAGNOSIS — Z0000 Encounter for general adult medical examination without abnormal findings: Secondary | ICD-10-CM

## 2023-11-18 LAB — CBC
Hematocrit: 45 % (ref 37.5–51.0)
Hemoglobin: 14.5 g/dL (ref 13.0–17.7)
MCH: 30.2 pg (ref 26.6–33.0)
MCHC: 32.2 g/dL (ref 31.5–35.7)
MCV: 94 fL (ref 79–97)
Platelets: 234 x10E3/uL (ref 150–450)
RBC: 4.8 x10E6/uL (ref 4.14–5.80)
RDW: 12.7 % (ref 11.6–15.4)
WBC: 7.9 x10E3/uL (ref 3.4–10.8)

## 2023-11-18 LAB — LIPID PANEL
Chol/HDL Ratio: 2.8 ratio (ref 0.0–5.0)
Cholesterol, Total: 171 mg/dL (ref 100–199)
HDL: 62 mg/dL (ref >39)
LDL Chol Calc (NIH): 85 mg/dL (ref 0–99)
Triglycerides: 136 mg/dL (ref 0–149)
VLDL Cholesterol Cal: 24 mg/dL (ref 5–40)

## 2023-11-18 LAB — BASIC METABOLIC PANEL WITH GFR
BUN/Creatinine Ratio: 10 (ref 10–24)
BUN: 10 mg/dL (ref 8–27)
CO2: 24 mmol/L (ref 20–29)
Calcium: 9.9 mg/dL (ref 8.6–10.2)
Chloride: 102 mmol/L (ref 96–106)
Creatinine, Ser: 1.04 mg/dL (ref 0.76–1.27)
Glucose: 107 mg/dL — ABNORMAL HIGH (ref 70–99)
Potassium: 4.2 mmol/L (ref 3.5–5.2)
Sodium: 139 mmol/L (ref 134–144)
eGFR: 78 mL/min/1.73 (ref >59)

## 2023-11-19 LAB — FECAL OCCULT BLOOD, IMMUNOCHEMICAL: Fecal Occult Bld: NEGATIVE

## 2023-11-22 ENCOUNTER — Ambulatory Visit: Payer: Self-pay

## 2023-11-22 NOTE — Progress Notes (Signed)
 BMP, FOBT, CBC WNL. Lipid panel showing LDL 85, which is an improvement from 102 in May 2025. Discussed with patient about the need to add an adjunct medication for his cholesterol. Patient does not want to try Repatha, and has an intolerance to Zetia . Informed the patient of an LDL goal <70, however patient would like to continue lifestyle modifications and Maca supplementation, as he has only been on it for ~2 months. Discussed how longitudinally, an LDL <70 would be protective of his heart and decrease his risk for stroke, as he has a history of CAD and MI. Patient voiced understanding. Amenable to continuing supplementation and Crestor  for the next 3 months, then rechecking lipids. If lipids at that time >70, he is amenable to adding secondary medications like fibrates, niacins, or cholestyramine in the future.

## 2023-12-01 ENCOUNTER — Other Ambulatory Visit: Payer: Self-pay

## 2023-12-01 ENCOUNTER — Ambulatory Visit

## 2023-12-01 VITALS — BP 109/75 | HR 81 | Temp 97.9°F | Ht 72.0 in | Wt 147.8 lb

## 2023-12-01 DIAGNOSIS — Z955 Presence of coronary angioplasty implant and graft: Secondary | ICD-10-CM

## 2023-12-01 DIAGNOSIS — Z8249 Family history of ischemic heart disease and other diseases of the circulatory system: Secondary | ICD-10-CM

## 2023-12-01 DIAGNOSIS — I252 Old myocardial infarction: Secondary | ICD-10-CM | POA: Diagnosis not present

## 2023-12-01 DIAGNOSIS — Z23 Encounter for immunization: Secondary | ICD-10-CM

## 2023-12-01 DIAGNOSIS — Z7902 Long term (current) use of antithrombotics/antiplatelets: Secondary | ICD-10-CM

## 2023-12-01 DIAGNOSIS — E782 Mixed hyperlipidemia: Secondary | ICD-10-CM

## 2023-12-01 DIAGNOSIS — I2510 Atherosclerotic heart disease of native coronary artery without angina pectoris: Secondary | ICD-10-CM

## 2023-12-01 DIAGNOSIS — Z7982 Long term (current) use of aspirin: Secondary | ICD-10-CM

## 2023-12-01 DIAGNOSIS — Z79899 Other long term (current) drug therapy: Secondary | ICD-10-CM

## 2023-12-01 NOTE — Progress Notes (Signed)
 Subjective:   Patient ID: [name] male   DOB: 12/11/1955 68 y.o.   MRN: [id number]  HPI: [name] is a 68 y.o. 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  Seen in office by Dr. Bernardo recently on 11/5 for routine follow up and health maintenance. At that visit, lipid panel was remarkable for elevated LDL 85, he declined addition of Zetia  (AE) and other lipid-lowering agents at that visit d/t recently starting maca supplementation and wanting to see the effects of this on his hypercholesterolemia. He was also referred to General surgery for gluteal infection. Subsequently saw Dr. Polly on 11/14, during which visit aspiration was attempted without significant fluid recovery, he was sent with 10 days of doxycycline and recommended for 4 week wound check to determine if there was an infectious nidus. He notes today that his induration has been improving with this therapy and has scheduled follow up w/ Dr. Polly in Dec.  CAD - no chest pain, dyspnea, orthopnea. He feels well. Has been adherent to aspirin /Plavix .   HM - due for tdap vaccine. We discussed this extensively today.   Past Medical History:  Diagnosis Date   Allergic rhinitis 03/30/2013   Back pain    Coronary artery [place]    [place] [address] s/p DES to RCA. b. NSTEMI 12/2007 s/p DES to Cx and LAD. c. NSTEMI 07/2021 declined [facility]   Diaphragmatic hernia 03/30/2013   Formatting of this note might be different from the original.  Jun 2006/ Endoscopy; Dr Aundria Deal of this note might be different from the original.  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 Swallow whole.     clopidogrel  (PLAVIX ) 75 MG tablet Take 1 tablet (75 mg total) by mouth daily. 90 tablet 3   Maca 500 MG CAPS Take 1 capsule by mouth 2 (two) times daily.     metoprolol  tartrate (LOPRESSOR ) 25 MG tablet Take 0.5 tablets (12.5 mg total) by mouth 2 (two) times daily. 90 tablet 0   nitroGLYCERIN  (NITROSTAT ) 0.4 MG SL tablet Place 1 tablet (0.4 mg total) under the tongue every 5 (five) minutes as needed for chest pain (up to 3 doses in 15 min per episode of chest pain and call 911 if you take 3rd dose in 15 min.). 25 tablet 5   NON FORMULARY Take 1 tablet by mouth daily. Medication: Ajwadate - Supplement     pantoprazole  (PROTONIX ) 40 MG tablet Take 1 tablet (40 mg total) by mouth daily. 90 tablet 3   rosuvastatin  (CRESTOR ) 40 MG tablet TAKE 1 TABLET(40 MG) BY MOUTH DAILY 30 tablet 0   Saw Palmetto 450 MG CAPS Take 450 mg by mouth as needed (as a health supplement).      No current facility-administered medications for this visit.   Family History  Problem Relation Age of Onset   Coronary artery disease Mother    Hypertension Father    Heart attack Paternal Grandfather    Social History  Socioeconomic History   Marital status: Married    Spouse name: Not on file   Number of children: Not on file   Years of education: Not on file   Highest education level: Not on file  Occupational History   Not on file  Tobacco Use   Smoking status: Never   Smokeless tobacco: Never  Vaping Use   Vaping status: Never Used  Substance and Sexual Activity   Alcohol use: No    Alcohol/week: 0.0 standard drinks of alcohol   Drug use: No   Sexual activity: Not on file  Other Topics Concern   Not on file  Social History Narrative   Not on file   Social Drivers of Health   Financial Resource Strain: Not on file  Food Insecurity: No Food Insecurity (11/17/2023)   Hunger  Vital Sign    Worried About Running Out of Food in the Last Year: Never true    Ran Out of Food in the Last Year: Never true  Transportation Needs: No Transportation Needs (11/17/2023)   PRAPARE - Administrator, Civil Service (Medical): No    Lack of Transportation (Non-Medical): No  Physical Activity: Not on file  Stress: Not on file  Social Connections: Not on file   Review of Systems: Pertinent items are noted in HPI. Objective:   Vitals:   12/01/23 0925  BP: 109/75  Pulse: 81  Temp: 97.9 F (36.6 C)  TempSrc: Oral  SpO2: 98%  Weight: 147 lb 12.8 oz (67 kg)  Height: 6' (1.829 m)    Physical Exam: GEN: Well appearing, NAD. Oriented x 3, normal mood and affect  HEENT: Normocephalic, atraumatic, Conjunctiva clear, sclera non-icteric CV: RRR, no M/R/G PULM: CTAB MSK: b/l LE wnl NEURO:moving all extremities spontaneously in upper and lower extremities. PSYCH: Oriented X3, intact recent and remote memory, judgment and insight, normal mood and affect.   Assessment & Plan:   Assessment & Plan Encounter for immunization Tdap vaccine given today. Coronary artery disease involving native coronary artery of native heart without angina pectoris Follows w/ Dr. Verlin. 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 Currently on aspirin /plavix . He denies any current or recent chest pain, dyspnea.  - Continue aspirin /Plavix . Recommended follow up with Cardiology.  - cont lopressor  12.5 mg BID per Cardiology  - He had questions regarding the safety of DAPT given recent diagnosis of oral venous pooling at his visit with [facility] (Dr. Mliss Catchings) on 09/2023. Upon my assessment, I advised him that the benefits of DAPT given his history of multivessel CAD and recurrent MI outweighed risks of bleeding at this junction. He has follow up with Cardiology and [facility] and I recommended obtaining the opinion of his consultants as well.  Mixed  hyperlipidemia LDL 85, goal <70. Currently on crestor  40 mg and maca 500 mg every day. Declined Zetia  or initiation of other lipid-lowering agents currently.  - recheck LDL at next visit  Jone Dauphin MD

## 2023-12-01 NOTE — Assessment & Plan Note (Signed)
 LDL 85, goal <70. Currently on crestor  40 mg and maca 500 mg every day. Declined Zetia  or initiation of other lipid-lowering agents currently.  - recheck LDL at next visit

## 2023-12-01 NOTE — Patient Instructions (Signed)
 Thank you for coming in today. If you have any questions or concerns, please feel free to contact me via MyChart or call the office.   Have a great day.

## 2023-12-01 NOTE — Assessment & Plan Note (Signed)
 Follows w/ Dr. Verlin. 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 Currently on aspirin /plavix . He denies any current or recent chest pain, dyspnea.  - Continue aspirin /Plavix . Recommended follow up with Cardiology.  - cont lopressor  12.5 mg BID per Cardiology  - He had questions regarding the safety of DAPT given recent diagnosis of oral venous pooling at his visit with [facility] (Dr. Mliss Catchings) on 09/2023. Upon my assessment, I advised him that the benefits of DAPT given his history of multivessel CAD and recurrent MI outweighed risks of bleeding at this junction. He has follow up with Cardiology and [facility] and I recommended obtaining the opinion of his consultants as well.

## 2024-01-19 ENCOUNTER — Telehealth: Payer: Self-pay | Admitting: Cardiovascular Disease

## 2024-01-19 NOTE — Telephone Encounter (Signed)
" °*  STAT* If patient is at the pharmacy, call can be transferred to refill team.   1. Which medications need to be refilled? (please list name of each medication and dose if known) clopidogrel  (PLAVIX ) 75 MG tablet  2. Which pharmacy/location (including street and city if local pharmacy) is medication to be sent to?  [facility] [phone] - [place], [place] - [address]    3. Do they need a 30 day or 90 day supply?  30  "

## 2024-01-21 MED ORDER — CLOPIDOGREL BISULFATE 75 MG PO TABS: 75.0000 mg | ORAL_TABLET | Freq: Every day | ORAL | 0 refills | Status: AC

## 2024-01-21 NOTE — Telephone Encounter (Signed)
 Refills has been sent to the pharmacy.

## 2024-01-31 ENCOUNTER — Telehealth: Payer: Self-pay | Admitting: *Deleted

## 2024-01-31 ENCOUNTER — Other Ambulatory Visit: Payer: Self-pay | Admitting: Cardiovascular Disease

## 2024-01-31 NOTE — Telephone Encounter (Signed)
 Refill was called in per of from Dr. Verlin.                                        Copied from CRM [id number]. Topic: Clinical - Medication Refill >> Jan 31, 2024  4:00 PM Cherylann S wrote: Medication: metoprolol  tartrate (LOPRESSOR ) 25 MG tablet  Has the patient contacted their pharmacy? Yes (Agent: If no, request that the patient contact the pharmacy for the refill. If patient does not wish to contact the pharmacy document the reason why and proceed with request.) (Agent: If yes, when and what did the pharmacy advise?)  This is the patient's preferred pharmacy:  [facility] [phone] - [place], [place] - [address] AT [facility] [address] [place] [address] Phone: [phone] Fax: [phone]  Is this the correct pharmacy for this prescription? Yes If no, delete pharmacy and type the correct one.   Has the prescription been filled recently? No  Is the patient out of the medication? No. 4 pills remaining   Has the patient been seen for an appointment in the last year OR does the patient have an upcoming appointment? Yes  Can we respond through MyChart? No  Agent: Please be advised that Rx refills may take up to 3 business days. We ask that you follow-up with your pharmacy.

## 2024-02-11 ENCOUNTER — Ambulatory Visit: Attending: Cardiovascular Disease | Admitting: Cardiovascular Disease

## 2024-02-11 ENCOUNTER — Encounter: Payer: Self-pay | Admitting: Cardiovascular Disease

## 2024-02-11 VITALS — BP 118/78 | HR 84 | Ht 72.0 in | Wt 148.2 lb

## 2024-02-11 DIAGNOSIS — I2510 Atherosclerotic heart disease of native coronary artery without angina pectoris: Secondary | ICD-10-CM

## 2024-02-11 DIAGNOSIS — E782 Mixed hyperlipidemia: Secondary | ICD-10-CM | POA: Diagnosis not present

## 2024-02-11 LAB — HEPATIC FUNCTION PANEL
ALT: 16 [IU]/L (ref 0–44)
AST: 23 [IU]/L (ref 0–40)
Albumin: 5.1 g/dL — ABNORMAL HIGH (ref 3.9–4.9)
Alkaline Phosphatase: 60 [IU]/L (ref 47–123)
Bilirubin Total: 0.8 mg/dL (ref 0.0–1.2)
Bilirubin, Direct: 0.25 mg/dL (ref 0.00–0.40)
Total Protein: 7.2 g/dL (ref 6.0–8.5)

## 2024-02-11 LAB — LIPID PANEL
Chol/HDL Ratio: 2.8 ratio (ref 0.0–5.0)
Cholesterol, Total: 165 mg/dL (ref 100–199)
HDL: 60 mg/dL
LDL Chol Calc (NIH): 90 mg/dL (ref 0–99)
Triglycerides: 78 mg/dL (ref 0–149)
VLDL Cholesterol Cal: 15 mg/dL (ref 5–40)

## 2024-02-11 NOTE — Progress Notes (Signed)
 "  Chief Complaint  Patient presents with   Follow-up    CAD   History of Present Illness: 69 yo male with history of CAD, sleep apnea and hyperlipidemia who is here today for cardiac follow up. 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 In December 2009 he had a non-ST elevation MI treated with drug-eluting stents in the circumflex and LAD. He had a follow-up catheterization in February 2010 which showed patent stents and nonobstructive disease. He was seen in our office in July 2020 and had c/o chest pain. Nuclear stress test August 2020 with no ischemia. He saw primary care on 08/04/21 after having chest pain while working in the yard in extreme heat and troponin was elevated. EKG did not show ischemic changes. He was sent to the ED and his troponin was trending down. EKG with no ischemic changes. He was seen by cardiology and refused admission. He left against medical advice. I saw him in the office in July 2023 and he was feeling well and did not wish to have a cardiac cath. He was last seen in our office in September 2024 and was doing well.   He is here today for follow up. The patient denies any chest pain, dyspnea, palpitations, lower extremity edema, orthopnea, PND, dizziness, near syncope or syncope.    Primary Care Physician: Shawn Sick, MD  Past Medical History:  Diagnosis Date   Allergic rhinitis 03/30/2013   Back pain    Coronary artery [place]    [place] [address] s/p DES to RCA. b. NSTEMI 12/2007 s/p DES to Cx and LAD. c. NSTEMI 07/2021 declined LHC   Diaphragmatic hernia 03/30/2013   Formatting of this note might be different from the original.  Jun 2006/ Endoscopy; Dr Aundria Deal of this note might be different from the original.  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 Swallow whole.     clopidogrel  (PLAVIX ) 75 MG tablet Take 1 tablet (75 mg total) by mouth daily. KEEP OV. 90 tablet 0   Maca 500 MG CAPS Take 1 capsule by mouth 2 (two) times daily.     metoprolol  tartrate (LOPRESSOR ) 25 MG tablet TAKE 1/2 TABLET(12.5 MG) BY MOUTH TWICE DAILY 15 tablet 0   nitroGLYCERIN  (NITROSTAT ) 0.4 MG SL tablet Place 1 tablet (0.4 mg total) under the tongue every 5 (five) minutes as needed for chest pain (up to 3 doses in 15 min per episode of chest pain and call 911 if you take 3rd dose in 15 min.). 25 tablet 5   NON FORMULARY Take 1 tablet by mouth daily. Medication: Ajwadate - Supplement     pantoprazole  (PROTONIX ) 40 MG tablet Take 1 tablet (40 mg total) by mouth daily. 90 tablet 3   rosuvastatin  (CRESTOR ) 40 MG tablet TAKE 1 TABLET(40 MG) BY MOUTH DAILY 30 tablet 0   Saw Palmetto 450 MG CAPS Take 450 mg by mouth as needed (as a health  supplement).      No current facility-administered medications for this visit.    Allergies  Allergen Reactions   Amoxicillin     REACTION: unspecified REACTION: unspecified   Citalopram Hydrobromide     Other reaction(s): DROWSINESS Other reaction(s): DROWSINESS   Clarithromycin     Other reaction(s): DIARRHEA Other reaction(s): DIARRHEA   Guaifenesin     Other reaction(s): HEADACHE Other reaction(s): HEADACHE   Penicillins     Other reaction(s): SWELLING Other reaction(s): SWELLING   Streptomycin     Other reaction(s): CHOKES Other reaction(s): CHOKES   Zetia  [Ezetimibe ] Other (See Comments)    Nausea, muscle aches, sluggish    Social History   Socioeconomic History   Marital status: Married    Spouse name: Not on file    Number of children: Not on file   Years of education: Not on file   Highest education level: Not on file  Occupational History   Not on file  Tobacco Use   Smoking status: Never   Smokeless tobacco: Never  Vaping Use   Vaping status: Never Used  Substance and Sexual Activity   Alcohol use: No    Alcohol/week: 0.0 standard drinks of alcohol   Drug use: No   Sexual activity: Not on file  Other Topics Concern   Not on file  Social History Narrative   Not on file   Social Drivers of Health   Tobacco Use: Low Risk (02/11/2024)   Patient History    Smoking Tobacco Use: Never    Smokeless Tobacco Use: Never    Passive Exposure: Not on file  Financial Resource Strain: Not on file  Food Insecurity: No Food Insecurity (11/17/2023)   Epic    Worried About Programme Researcher, Broadcasting/film/video in the Last Year: Never true    [employer] of Food in the Last Year: Never true  Transportation Needs: No Transportation Needs (11/17/2023)   Epic    Lack of Transportation (Medical): No    Lack of Transportation (Non-Medical): No  Physical Activity: Not on file  Stress: Not on file  Social Connections: Not on file  Intimate Partner Violence: Not At Risk (11/17/2023)   Epic    Fear of Current or Ex-Partner: No    Emotionally Abused: No    Physically Abused: No    Sexually Abused: No  Depression (PHQ2-9): Low Risk (12/01/2023)   Depression (PHQ2-9)    PHQ-2 Score: 0  Alcohol Screen: Not on file  Housing: Unknown (11/26/2023)   Received from [facility]   Epic    Unable to Pay for Housing in the Last Year: Not on file    Number of Times Moved in the Last Year: Not on file    At any time in the past 12 months, were you homeless or living in a shelter (including now)?: No  Utilities: Not At Risk (11/17/2023)   Epic    Threatened with loss of utilities: No  Health Literacy: Not on file    Family History  Problem Relation Age of Onset   Coronary artery disease Mother    Hypertension Father     Heart attack Paternal Grandfather     Review of Systems:  As stated in the HPI and otherwise negative.   BP 118/78 (BP Location: Left Arm, Patient Position: Sitting, Cuff Size: Normal)   Pulse 84   Ht 6' (1.829 m)   Wt 148 lb 3.2 oz (67.2 kg)   SpO2 99%   BMI  20.10 kg/m   Physical Examination: General: Well developed, well nourished, NAD  SKIN: warm, dry. Neuro: No focal deficits  Psychiatric: Mood and affect normal  Neck: No JVD Lungs:Clear bilaterally, no wheezes, rhonci, crackles Cardiovascular: Regular rate and rhythm. No murmurs, gallops or rubs. Abdomen:Soft.  Extremities: No lower extremity edema.  .  EKG:  EKG is ordered today. The ekg ordered today demonstrates  EKG Interpretation Date/Time:  Friday February 11 2024 16:01:01 EST Ventricular Rate:  70 PR Interval:  162 QRS Duration:  90 QT Interval:  402 QTC Calculation: 434 R Axis:   84  Text Interpretation: Normal sinus rhythm Normal ECG Confirmed by Verlin Bruckner [id number]) on 02/11/2024 4:02:48 PM   Recent Labs: 11/17/2023: BUN 10; Creatinine, Ser 1.04; Hemoglobin 14.5; Platelets 234; Potassium 4.2; Sodium 139   Lipid Panel    Component Value Date/Time   CHOL 171 11/17/2023 0917   TRIG 136 11/17/2023 0917   HDL 62 11/17/2023 0917   CHOLHDL 2.8 11/17/2023 0917   CHOLHDL 4 04/18/2014 1613   VLDL 18.8 04/18/2014 1613   LDLCALC 85 11/17/2023 0917   LDLDIRECT 146.2 11/20/2009 0907   LABVLDL 24 11/17/2023 0917     Wt Readings from Last 3 Encounters:  02/11/24 148 lb 3.2 oz (67.2 kg)  12/01/23 147 lb 12.8 oz (67 kg)  11/17/23 146 lb 3.2 oz (66.3 kg)    Assessment and Plan:   1. CAD without angina: No chest pain.  -Continue ASA, Plavix , Will continue ASA, Plavix , Crestor  and Lopressor .   2. Hyperlipidemia:  LDL near goal in November 7974. He has not tolerate Zetia  in the past. He does not wish to consider Repatha injections.  -Continue Crestor .   -He wishes to have his lipids checked today  after starting a supplement  Labs/ tests ordered today include:  Orders Placed This Encounter  Procedures   Lipid Profile   Hepatic function panel   EKG 12-Lead   Disposition:   F/U with me in 12  months  Signed, Bruckner Verlin, MD 02/11/2024 4:31 PM    [facility] [address] [place], [place], [place]  [address] Phone: [phone]; Fax: [phone]  "

## 2024-02-11 NOTE — Patient Instructions (Signed)
 Medication Instructions:  Your physician recommends that you continue on your current medications as directed. Please refer to the Current Medication list given to you today.  *If you need a refill on your cardiac medications before your next appointment, please call your pharmacy*  Lab Work: Have lab work drawn today in the lab on the first floor--Lipid and liver profiles If you have labs (blood work) drawn today and your tests are completely normal, you will receive your results only by: MyChart Message (if you have MyChart) OR A paper copy in the mail If you have any lab test that is abnormal or we need to change your treatment, we will call you to review the results.  Testing/Procedures: none  Follow-Up: At [facility], you and your health needs are our priority.  As part of our continuing mission to provide you with exceptional heart care, our providers are all part of one team.  This team includes your primary Cardiologist (physician) and Advanced Practice Providers or APPs (Physician Assistants and Nurse Practitioners) who all work together to provide you with the care you need, when you need it.  Your next appointment:   12 month(s)  Provider:   Lonni Cash, MD    We recommend signing up for the patient portal called MyChart.  Sign up information is provided on this After Visit Summary.  MyChart is used to connect with patients for Virtual Visits (Telemedicine).  Patients are able to view lab/test results, encounter notes, upcoming appointments, etc.  Non-urgent messages can be sent to your provider as well.   To learn more about what you can do with MyChart, go to [email or web address].   Other Instructions

## 2024-02-13 ENCOUNTER — Ambulatory Visit: Payer: Self-pay | Admitting: Cardiovascular Disease
